# Patient Record
Sex: Female | Born: 1950
Health system: Southern US, Community
[De-identification: ages and names within clinical notes are randomized; demographics above are authoritative.]

## PROBLEM LIST (undated history)

## (undated) DIAGNOSIS — E785 Hyperlipidemia, unspecified: Secondary | ICD-10-CM

## (undated) DIAGNOSIS — I10 Essential (primary) hypertension: Secondary | ICD-10-CM

## (undated) DIAGNOSIS — N920 Excessive and frequent menstruation with regular cycle: Secondary | ICD-10-CM

## (undated) DIAGNOSIS — N182 Chronic kidney disease, stage 2 (mild): Secondary | ICD-10-CM

## (undated) DIAGNOSIS — E669 Obesity, unspecified: Secondary | ICD-10-CM

## (undated) HISTORY — DX: Obesity, unspecified: E66.9

## (undated) HISTORY — DX: Excessive and frequent menstruation with regular cycle: N92.0

## (undated) HISTORY — DX: Chronic kidney disease, stage 2 (mild): N18.2

## (undated) HISTORY — PX: CHOLECYSTECTOMY: SHX55

## (undated) HISTORY — DX: Hyperlipidemia, unspecified: E78.5

## (undated) HISTORY — DX: Essential (primary) hypertension: I10

---

## 1981-08-29 HISTORY — PX: OTHER SURGICAL HISTORY: SHX169

## 2004-01-28 ENCOUNTER — Encounter: Payer: Self-pay | Admitting: Family Medicine

## 2004-01-28 LAB — CONVERTED CEMR LAB

## 2006-04-29 LAB — HM COLONOSCOPY: HM Colonoscopy: NORMAL

## 2006-05-12 LAB — CONVERTED CEMR LAB
ALT: 21 units/L
Calcium: 9.1 mg/dL
Creatinine, Ser: 1.01 mg/dL
Glucose, Bld: 99 mg/dL
HDL: 67 mg/dL
LDL Cholesterol: 98 mg/dL
Total Bilirubin: 0.4 mg/dL
Triglycerides: 96 mg/dL

## 2006-05-17 ENCOUNTER — Encounter: Payer: Self-pay | Admitting: Family Medicine

## 2006-05-17 LAB — CONVERTED CEMR LAB
Microalb Creat Ratio: 6.3 mg/g
Microalb, Ur: 0.37 mg/dL
Microalb, Ur: NORMAL mg/dL

## 2006-09-14 ENCOUNTER — Ambulatory Visit: Payer: Self-pay | Admitting: Family Medicine

## 2006-09-14 DIAGNOSIS — E119 Type 2 diabetes mellitus without complications: Secondary | ICD-10-CM

## 2006-09-14 DIAGNOSIS — E1169 Type 2 diabetes mellitus with other specified complication: Secondary | ICD-10-CM | POA: Insufficient documentation

## 2006-09-14 DIAGNOSIS — E785 Hyperlipidemia, unspecified: Secondary | ICD-10-CM

## 2006-09-14 DIAGNOSIS — E118 Type 2 diabetes mellitus with unspecified complications: Secondary | ICD-10-CM | POA: Insufficient documentation

## 2006-09-14 DIAGNOSIS — I10 Essential (primary) hypertension: Secondary | ICD-10-CM | POA: Insufficient documentation

## 2006-10-12 ENCOUNTER — Ambulatory Visit: Payer: Self-pay | Admitting: Family Medicine

## 2006-10-12 LAB — CONVERTED CEMR LAB: Hgb A1c MFr Bld: 5.9 %

## 2006-10-13 ENCOUNTER — Encounter: Payer: Self-pay | Admitting: Family Medicine

## 2006-11-14 ENCOUNTER — Encounter: Payer: Self-pay | Admitting: Family Medicine

## 2006-12-01 ENCOUNTER — Telehealth: Payer: Self-pay | Admitting: Family Medicine

## 2007-02-01 ENCOUNTER — Ambulatory Visit: Payer: Self-pay | Admitting: Family Medicine

## 2007-02-01 LAB — CONVERTED CEMR LAB: Hgb A1c MFr Bld: 6.2 %

## 2007-07-06 ENCOUNTER — Ambulatory Visit: Payer: Self-pay | Admitting: Family Medicine

## 2007-10-11 ENCOUNTER — Encounter: Payer: Self-pay | Admitting: Family Medicine

## 2007-10-11 LAB — CONVERTED CEMR LAB
ALT: 22 units/L (ref 0–35)
Alkaline Phosphatase: 37 units/L — ABNORMAL LOW (ref 39–117)
BUN: 27 mg/dL — ABNORMAL HIGH (ref 6–23)
Chloride: 104 meq/L (ref 96–112)
Cholesterol: 210 mg/dL — ABNORMAL HIGH (ref 0–200)
Creatinine, Ser: 1.12 mg/dL (ref 0.40–1.20)
Glucose, Bld: 85 mg/dL (ref 70–99)
HDL: 58 mg/dL (ref >39)
Potassium: 5.2 meq/L (ref 3.5–5.3)
Total CHOL/HDL Ratio: 3.6

## 2007-10-12 ENCOUNTER — Encounter: Payer: Self-pay | Admitting: Family Medicine

## 2007-10-16 ENCOUNTER — Ambulatory Visit: Payer: Self-pay | Admitting: Family Medicine

## 2007-11-30 HISTORY — PX: CATARACT EXTRACTION: SUR2

## 2007-12-21 ENCOUNTER — Encounter: Payer: Self-pay | Admitting: Family Medicine

## 2008-01-09 ENCOUNTER — Telehealth: Payer: Self-pay | Admitting: Family Medicine

## 2008-01-11 ENCOUNTER — Encounter: Payer: Self-pay | Admitting: Family Medicine

## 2008-01-12 ENCOUNTER — Encounter: Payer: Self-pay | Admitting: Family Medicine

## 2008-01-12 LAB — CONVERTED CEMR LAB
ALT: 18 U/L
AST: 15 U/L
Direct LDL: 112 mg/dL — ABNORMAL HIGH

## 2008-01-17 ENCOUNTER — Ambulatory Visit: Payer: Self-pay | Admitting: Family Medicine

## 2008-01-17 DIAGNOSIS — G479 Sleep disorder, unspecified: Secondary | ICD-10-CM | POA: Insufficient documentation

## 2008-01-17 LAB — CONVERTED CEMR LAB: Hgb A1c MFr Bld: 6.2 %

## 2008-04-09 ENCOUNTER — Telehealth: Payer: Self-pay | Admitting: Family Medicine

## 2008-04-10 ENCOUNTER — Encounter: Payer: Self-pay | Admitting: Family Medicine

## 2008-04-11 LAB — CONVERTED CEMR LAB: Direct LDL: 100 mg/dL — ABNORMAL HIGH

## 2008-04-19 ENCOUNTER — Ambulatory Visit: Payer: Self-pay | Admitting: Family Medicine

## 2008-04-19 LAB — CONVERTED CEMR LAB

## 2008-08-27 ENCOUNTER — Ambulatory Visit: Payer: Self-pay | Admitting: Family Medicine

## 2008-08-27 DIAGNOSIS — E669 Obesity, unspecified: Secondary | ICD-10-CM

## 2008-08-27 LAB — CONVERTED CEMR LAB: Hgb A1c MFr Bld: 6.2 %

## 2008-10-29 ENCOUNTER — Encounter: Payer: Self-pay | Admitting: Family Medicine

## 2008-10-30 ENCOUNTER — Encounter: Payer: Self-pay | Admitting: Family Medicine

## 2008-10-31 ENCOUNTER — Encounter: Payer: Self-pay | Admitting: Family Medicine

## 2008-10-31 LAB — CONVERTED CEMR LAB
ALT: 24 units/L (ref 0–35)
Albumin: 4.7 g/dL (ref 3.5–5.2)
Calcium: 9.4 mg/dL (ref 8.4–10.5)
Cholesterol: 187 mg/dL (ref 0–200)
Creatinine, Ser: 0.86 mg/dL (ref 0.40–1.20)
Glucose, Bld: 102 mg/dL — ABNORMAL HIGH (ref 70–99)
HDL: 59 mg/dL (ref >39)
Sodium: 139 meq/L (ref 135–145)
T3, Free: 2.5 pg/mL (ref 2.3–4.2)
TSH: 5.949 microintl units/mL — ABNORMAL HIGH (ref 0.350–4.50)
Total CHOL/HDL Ratio: 3.2

## 2008-11-01 ENCOUNTER — Encounter: Payer: Self-pay | Admitting: Family Medicine

## 2008-11-01 DIAGNOSIS — E039 Hypothyroidism, unspecified: Secondary | ICD-10-CM | POA: Insufficient documentation

## 2008-12-17 ENCOUNTER — Telehealth: Payer: Self-pay | Admitting: Family Medicine

## 2008-12-18 ENCOUNTER — Encounter: Payer: Self-pay | Admitting: Family Medicine

## 2008-12-19 LAB — CONVERTED CEMR LAB: TSH: 2.431 microintl units/mL (ref 0.350–4.50)

## 2008-12-26 ENCOUNTER — Ambulatory Visit: Payer: Self-pay | Admitting: Family Medicine

## 2008-12-26 LAB — CONVERTED CEMR LAB: Hgb A1c MFr Bld: 6.5 %

## 2009-01-20 ENCOUNTER — Telehealth: Payer: Self-pay | Admitting: Family Medicine

## 2009-02-07 ENCOUNTER — Encounter: Payer: Self-pay | Admitting: Family Medicine

## 2009-02-11 LAB — CONVERTED CEMR LAB: TSH: 1.358 microintl units/mL (ref 0.350–4.500)

## 2009-02-14 ENCOUNTER — Ambulatory Visit: Payer: Self-pay | Admitting: Family Medicine

## 2009-02-17 ENCOUNTER — Encounter: Payer: Self-pay | Admitting: Family Medicine

## 2009-04-23 ENCOUNTER — Ambulatory Visit: Payer: Self-pay | Admitting: Family Medicine

## 2009-04-23 LAB — CONVERTED CEMR LAB: Albumin/Creatinine Ratio, Urine, POC: <30

## 2009-08-27 ENCOUNTER — Ambulatory Visit: Payer: Self-pay | Admitting: Family Medicine

## 2009-09-02 ENCOUNTER — Encounter: Admission: RE | Admit: 2009-09-02 | Discharge: 2009-09-02 | Payer: Self-pay | Admitting: Endocrinology

## 2009-09-02 LAB — HM MAMMOGRAPHY: HM Mammogram: NEGATIVE

## 2009-12-16 ENCOUNTER — Telehealth: Payer: Self-pay | Admitting: Family Medicine

## 2009-12-16 DIAGNOSIS — R5383 Other fatigue: Secondary | ICD-10-CM

## 2009-12-16 DIAGNOSIS — R5381 Other malaise: Secondary | ICD-10-CM

## 2009-12-17 ENCOUNTER — Encounter: Payer: Self-pay | Admitting: Family Medicine

## 2009-12-18 LAB — CONVERTED CEMR LAB
ALT: 28 units/L (ref 0–35)
AST: 22 units/L (ref 0–37)
Albumin: 4.4 g/dL (ref 3.5–5.2)
Alkaline Phosphatase: 94 units/L (ref 39–117)
BUN: 13 mg/dL (ref 6–23)
CO2: 26 meq/L (ref 19–32)
Calcium: 9.3 mg/dL (ref 8.4–10.5)
Chloride: 100 meq/L (ref 96–112)
Cholesterol: 165 mg/dL (ref 0–200)
Creatinine, Ser: 0.83 mg/dL (ref 0.40–1.20)
Glucose, Bld: 102 mg/dL — ABNORMAL HIGH (ref 70–99)
HCT: 38.6 % (ref 36.0–46.0)
HDL: 44 mg/dL (ref >39)
Hemoglobin: 12.8 g/dL (ref 12.0–15.0)
Hgb A1c MFr Bld: 6.6 % — ABNORMAL HIGH (ref 4.6–6.1)
LDL Cholesterol: 72 mg/dL (ref 0–99)
MCHC: 33.2 g/dL (ref 30.0–36.0)
MCV: 91 fL (ref 78.0–100.0)
Platelets: 300 10*3/uL (ref 150–400)
Potassium: 4.2 meq/L (ref 3.5–5.3)
RBC: 4.24 M/uL (ref 3.87–5.11)
RDW: 13.9 % (ref 11.5–15.5)
Sodium: 138 meq/L (ref 135–145)
Total Bilirubin: 0.3 mg/dL (ref 0.3–1.2)
Total CHOL/HDL Ratio: 3.8
Total Protein: 7 g/dL (ref 6.0–8.3)
Triglycerides: 246 mg/dL — ABNORMAL HIGH (ref <150)
VLDL: 49 mg/dL — ABNORMAL HIGH (ref 0–40)
WBC: 7.1 10*3/uL (ref 4.0–10.5)

## 2009-12-24 ENCOUNTER — Ambulatory Visit: Payer: Self-pay | Admitting: Family Medicine

## 2009-12-24 LAB — CONVERTED CEMR LAB: Glucose, Bld: 117 mg/dL

## 2010-04-10 ENCOUNTER — Ambulatory Visit: Payer: Self-pay | Admitting: Family

## 2010-04-10 DIAGNOSIS — J40 Bronchitis, not specified as acute or chronic: Secondary | ICD-10-CM | POA: Insufficient documentation

## 2010-04-23 ENCOUNTER — Ambulatory Visit: Payer: Self-pay | Admitting: Family Medicine

## 2010-04-24 LAB — CONVERTED CEMR LAB: TSH: 1.949 microintl units/mL (ref 0.350–4.500)

## 2010-10-21 ENCOUNTER — Ambulatory Visit: Payer: Self-pay | Admitting: Family Medicine

## 2010-10-21 LAB — CONVERTED CEMR LAB: Creatinine,U: 100 mg/dL

## 2010-12-29 NOTE — Assessment & Plan Note (Signed)
Summary: Cold, Coughing alot - jr   Vital Signs:  Patient profile:   60 year old female Height:      61.75 inches Weight:      187.50 pounds BMI:     34.70 O2 Sat:      98 % on Room air Temp:     98.3 degrees F oral Pulse rate:   78 / minute Pulse rhythm:   regular Resp:     16 per minute BP sitting:   112 / 70  (right arm) Cuff size:   regular  Vitals Entered By: Mervin Kung CMA (Apr 10, 2010 8:13 AM)  O2 Flow:  Room air CC: room 5  Dry cough since Sunday. Is Patient Diabetic? Yes   Primary Care Provider:  Seymour Bars DO  CC:  room 5  Dry cough since Sunday.Marland Kitchen  History of Present Illness: Ms Emma Craig is a 60 year old female who presents with complaint of cough since Sunday.  Reports that cough has been productive of yellow sputum at times.   Denies associated nasal  congestion/sinus congestion or fever.  She  has not tried any OTC med due to concern about interaction with her othermedications.  Denies sick contacts.  Denies associated alleviating factors or aggravating factors.  Allergies (verified): No Known Drug Allergies  Physical Exam  General:  Well-developed,well-nourished,in no acute distress; alert,appropriate and cooperative throughout examination Head:  Normocephalic and atraumatic without obvious abnormalities. No apparent alopecia or balding. Neck:  No deformities, masses, or tenderness noted. Lungs:  Normal respiratory effort, chest expands symmetrically. Lungs are clear to auscultation, no crackles or wheezes. Heart:  Normal rate and regular rhythm. S1 and S2 normal without gallop, murmur, click, rub or other extra sounds.   Impression & Recommendations:  Problem # 1:  BRONCHITIS, MILD (ICD-490) Assessment New Will treat with Zithromax and tessalon as needed.  Pt instructed to follow up if symptoms worsen or do not improve.  Her updated medication list for this problem includes:    Zithromax Z-pak 250 Mg Tabs (Azithromycin) .Marland Kitchen... 2 tabs by mouth today,  then one tablet by mouth daily x 4 more days    Tessalon Perles 100 Mg Caps (Benzonatate) ..... One cap by mouth three times a day as needed cough  Complete Medication List: 1)  Benicar Hct 40-12.5 Mg Tabs (Olmesartan medoxomil-hctz) .Marland Kitchen.. 1 tab by mouth qd 2)  Amaryl 2 Mg Tabs (Glimepiride) .Marland Kitchen.. 1 tab by mouth qd 3)  Aspirin 81 Mg Tbec (Aspirin) .... Take 1 tablet by mouth once a day 4)  Caduet 10-80 Mg Tabs (Amlodipine-atorvastatin) .Marland Kitchen.. 1 tab by mouth at bedtime 5)  Levoxyl 75 Mcg Tabs (Levothyroxine sodium) .Marland Kitchen.. 1 tab by mouth daily 6)  Atenolol 50 Mg Tabs (Atenolol) .Marland Kitchen.. 1 tab by mouth daily 7)  Fish Oil 1000 Mg Caps (Omega-3 fatty acids) .... Take 1 capsule by mouth once a day 8)  Zithromax Z-pak 250 Mg Tabs (Azithromycin) .... 2 tabs by mouth today, then one tablet by mouth daily x 4 more days 9)  Tessalon Perles 100 Mg Caps (Benzonatate) .... One cap by mouth three times a day as needed cough  Patient Instructions: 1)  Please call if your symptoms worsen or do not improve. Prescriptions: TESSALON PERLES 100 MG CAPS (BENZONATATE) one cap by mouth three times a day as needed cough  #30 x 0   Entered and Authorized by:   Lemont Fillers FNP   Signed by:   Lemont Fillers  FNP on 04/10/2010   Method used:   Electronically to        [facility] [phone]* (retail)       [address]       [place], [place]  [address]       Ph: [phone]       Fax: [phone]   RxID:   [id number] ZITHROMAX Z-PAK 250 MG TABS (AZITHROMYCIN) 2 tabs by mouth today, then one tablet by mouth daily x 4 more days  #1 pack x 0   Entered and Authorized by:   Lemont Fillers FNP   Signed by:   Lemont Fillers FNP on 04/10/2010   Method used:   Electronically to        [facility] [phone]* (retail)       [address]       [place], [place]  [address]       Ph: [phone]       Fax: [phone]   RxID:   [id number]   Current Allergies (reviewed today): No known  allergies

## 2010-12-29 NOTE — Assessment & Plan Note (Signed)
Summary: f/u DM   Vital Signs:  Patient profile:   60 year old female Height:      61.75 inches Weight:      187 pounds BMI:     34.60 O2 Sat:      99 % on Room air Pulse rate:   70 / minute BP sitting:   106 / 70  (left arm) Cuff size:   regular  Vitals Entered By: Payton Spark CMA (Apr 23, 2010 8:31 AM)  O2 Flow:  Room air CC: F/U DM   Primary Care Provider:  Seymour Bars DO  CC:  F/U DM.  History of Present Illness: 60 yo WF presents for f/u T2DM.  She is on Amaryl 2 mg / day.  Denies any lows.  Eye exam is UTD.  A1C is 6.6--> 6.2 today.  Having some triggering of the 3rd and 4th digits on the R hand. She has been on Weight Watchers but has only lost 1 lbs. She is clogging once a wk and doing water aerobics 3 x a wk.  She denies CP or DOE.     Current Medications (verified): 1)  Benicar Hct 40-12.5 Mg Tabs (Olmesartan Medoxomil-Hctz) .Marland Kitchen.. 1 Tab By Mouth Qd 2)  Amaryl 2 Mg Tabs (Glimepiride) .Marland Kitchen.. 1 Tab By Mouth Qd 3)  Aspirin 81 Mg Tbec (Aspirin) .... Take 1 Tablet By Mouth Once A Day 4)  Caduet 10-80 Mg Tabs (Amlodipine-Atorvastatin) .Marland Kitchen.. 1 Tab By Mouth At Bedtime 5)  Levoxyl 75 Mcg Tabs (Levothyroxine Sodium) .Marland Kitchen.. 1 Tab By Mouth Daily 6)  Atenolol 50 Mg Tabs (Atenolol) .Marland Kitchen.. 1 Tab By Mouth Daily 7)  Fish Oil 1000 Mg Caps (Omega-3 Fatty Acids) .... Take 1 Capsule By Mouth Once A Day  Allergies (verified): No Known Drug Allergies  Past History:  Past Medical History: Reviewed history from 02/01/2007 and no changes required. Obesity Diabetes mellitus, type II-- was on Metformin, but caused decreased GFR Hyperlipidemia Hypertension heavy menses G2P2 Decreased GFR  Social History: Reviewed history from 09/14/2006 and no changes required. 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  Married to [place].  Has 2 grown daughters and 2 grandsons (local).  Nonsmoker.  Rare ETOH.  Likes to Clog 2 x a wk.  Review of Systems      See  HPI  Physical Exam  General:  alert, well-developed, well-nourished, and well-hydrated.   Head:  normocephalic and atraumatic.   Eyes:  pupils equal, pupils round, and pupils reactive to light.   Ears:  no external deformities.   Nose:  no nasal discharge.   Mouth:  pharynx pink and moist and fair dentition.   Neck:  no masses.   Lungs:  Normal respiratory effort, chest expands symmetrically. Lungs are clear to auscultation, no crackles or wheezes. Heart:  Normal rate and regular rhythm. S1 and S2 normal without gallop, murmur, click, rub or other extra sounds. Abdomen:  soft with truncal obesity.  NO AA bruits.  No HSM Extremities:  no LE edema Skin:  color normal.   Cervical Nodes:  No lymphadenopathy noted Psych:  good eye contact, not anxious appearing, and not depressed appearing.     Impression & Recommendations:  Problem # 1:  DIABETES MELLITUS, TYPE II (ICD-250.00) A1C great at 6.2, down from 6.6.  Will get urine micro at next visit.  She claims that her eye exam is UTD.  Her labs are UTD.  She is to stay on  Amaryl and work on [employer], exercise, wt loss.  RTC in 6 mos. Her updated medication list for this problem includes:    Benicar Hct 40-12.5 Mg Tabs (Olmesartan medoxomil-hctz) .Marland Kitchen... 1 tab by mouth qd    Amaryl 2 Mg Tabs (Glimepiride) .Marland Kitchen... 1 tab by mouth qd    Aspirin 81 Mg Tbec (Aspirin) .Marland Kitchen... Take 1 tablet by mouth once a day  Orders: Fingerstick (56213) Hemoglobin A1C (83036)  Labs Reviewed: Creat: 0.83 (12/17/2009)   Microalbumin: 10 (04/23/2009)  Last Eye Exam: normal Dr Weston Anna (10/07/2006) Reviewed HgBA1c results: 6.2 (04/23/2010)  6.6 (12/17/2009)  Problem # 2:  HYPERTENSION (ICD-401.9) At goal on current meds.  Continue.  Labs are UTD. Her updated medication list for this problem includes:    Benicar Hct 40-12.5 Mg Tabs (Olmesartan medoxomil-hctz) .Marland Kitchen... 1 tab by mouth qd    Caduet 10-80 Mg Tabs (Amlodipine-atorvastatin) .Marland Kitchen... 1 tab by mouth at  bedtime    Atenolol 50 Mg Tabs (Atenolol) .Marland Kitchen... 1 tab by mouth daily  BP today: 106/70 Prior BP: 112/70 (04/10/2010)  Labs Reviewed: K+: 4.2 (12/17/2009) Creat: : 0.83 (12/17/2009)   Chol: 165 (12/17/2009)   HDL: 44 (12/17/2009)   LDL: 72 (12/17/2009)   TG: 246 (12/17/2009)  Problem # 3:  HYPERLIPIDEMIA (ICD-272.4)  Her updated medication list for this problem includes:    Caduet 10-80 Mg Tabs (Amlodipine-atorvastatin) .Marland Kitchen... 1 tab by mouth at bedtime  Labs Reviewed: SGOT: 22 (12/17/2009)   SGPT: 28 (12/17/2009)   HDL:44 (12/17/2009), 59 (10/29/2008)  LDL:72 (12/17/2009), 82 (10/29/2008)  Chol:165 (12/17/2009), 187 (10/29/2008)  Trig:246 (12/17/2009), 228 (10/29/2008)  Complete Medication List: 1)  Benicar Hct 40-12.5 Mg Tabs (Olmesartan medoxomil-hctz) .Marland Kitchen.. 1 tab by mouth qd 2)  Amaryl 2 Mg Tabs (Glimepiride) .Marland Kitchen.. 1 tab by mouth qd 3)  Aspirin 81 Mg Tbec (Aspirin) .... Take 1 tablet by mouth once a day 4)  Caduet 10-80 Mg Tabs (Amlodipine-atorvastatin) .Marland Kitchen.. 1 tab by mouth at bedtime 5)  Levoxyl 75 Mcg Tabs (Levothyroxine sodium) .Marland Kitchen.. 1 tab by mouth daily 6)  Atenolol 50 Mg Tabs (Atenolol) .Marland Kitchen.. 1 tab by mouth daily 7)  Fish Oil 1000 Mg Caps (Omega-3 fatty acids) .... Take 1 capsule by mouth once a day  Other Orders: T-TSH ([id number])  Patient Instructions: 1)  Check TSH today. 2)  Will adjust thyroid meds tomorrow and call you w/ the results. 3)  Return for f/u in 6 mos. Prescriptions: ATENOLOL 50 MG TABS (ATENOLOL) 1 tab by mouth daily  #30 x 6   Entered and Authorized by:   Seymour Bars DO   Signed by:   Seymour Bars DO on 04/23/2010   Method used:   Electronically to        [facility]  [id number]* (retail)       [address]       [place], [place]  [address]       Ph: [phone]       Fax: [phone]   RxID:   [id number] CADUET 10-80 MG TABS (AMLODIPINE-ATORVASTATIN) 1 tab by mouth at bedtime  #30 Tablet x 6   Entered and Authorized by:   Seymour Bars DO    Signed by:   Seymour Bars DO on 04/23/2010   Method used:   Electronically to        [facility] [phone]* (retail)       [address]       [place], [place]  [address]  Ph: [phone]       Fax: [phone]   RxID:   [id number] AMARYL 2 MG TABS (GLIMEPIRIDE) 1 tab by mouth qd  #30 Tablet x 6   Entered and Authorized by:   Seymour Bars DO   Signed by:   Seymour Bars DO on 04/23/2010   Method used:   Electronically to        [facility]  [id number]* (retail)       [address]       [place], [place]  [address]       Ph: [phone]       Fax: [phone]   RxID:   [id number] BENICAR HCT 40-12.5 MG TABS (OLMESARTAN MEDOXOMIL-HCTZ) 1 tab by mouth qd  #30 Tablet x 6   Entered and Authorized by:   Seymour Bars DO   Signed by:   Seymour Bars DO on 04/23/2010   Method used:   Electronically to        [facility]  [address] [phone]* (retail)       [address]       [place], [place]  [address]       Ph: [phone]       Fax: [phone]   RxID:   [id number]   Laboratory Results   Blood Tests     HGBA1C: 6.2%   (Normal Range: Non-Diabetic - 3-6%   Control Diabetic - 6-8%)

## 2010-12-29 NOTE — Assessment & Plan Note (Signed)
Summary: f/u diabetes   Vital Signs:  Patient profile:   60 year old female Height:      61.75 inches Weight:      192 pounds Pulse rate:   76 / minute BP sitting:   123 / 70  (left arm) Cuff size:   regular  Vitals Entered By: Kathlene November (December 24, 2009 8:11 AM) CC: checkup BP and blood sugars Is Patient Diabetic? Yes Did you bring your meter with you today? No   Primary Care Provider:  Seymour Bars DO  CC:  checkup BP and blood sugars.  History of Present Illness: 60 yo WF presents for f/u T2DM, HTN and hyperlipidemia.    She started Weight Watchers 2 wks ago.  She has lost 5 lbs so far.  She is clogging and just busy at work with more walking at work.  Her BP has improved.  Her labs were updated last wk.  She is not checking her sugars at home.  She has felt shakey from time to time.  She is on Amaryl with breakfast.  Denies CP or DOE.  Denies blurry vision or paresthesias.    Current Medications (verified): 1)  Benicar Hct 40-12.5 Mg Tabs (Olmesartan Medoxomil-Hctz) .Marland Kitchen.. 1 Tab By Mouth Qd 2)  Amaryl 2 Mg Tabs (Glimepiride) .Marland Kitchen.. 1 Tab By Mouth Qd 3)  Aspirin 81 Mg Tbec (Aspirin) .... Take 1 Tablet By Mouth Once A Day 4)  Caduet 10-80 Mg Tabs (Amlodipine-Atorvastatin) .Marland Kitchen.. 1 Tab By Mouth At Bedtime 5)  Levoxyl 75 Mcg Tabs (Levothyroxine Sodium) .Marland Kitchen.. 1 Tab By Mouth Daily 6)  Atenolol 50 Mg Tabs (Atenolol) .Marland Kitchen.. 1 Tab By Mouth Daily  Allergies (verified): No Known Drug Allergies  Comments:  Nurse/Medical Assistant: The patient's medications and allergies were reviewed with the patient and were updated in the Medication and Allergy Lists. Kathlene November (December 24, 2009 8:12 AM)  Past History:  Past Medical History: Reviewed history from 02/01/2007 and no changes required. Obesity Diabetes mellitus, type II-- was on Metformin, but caused decreased GFR Hyperlipidemia Hypertension heavy menses G2P2 Decreased GFR  Past Surgical History: Reviewed history from  01/17/2008 and no changes required. R elbow screws placed 10-82 lap cholecystectomy 1-04 R cataract surgery 1-09  Social History: Reviewed history from 09/14/2006 and no changes required. 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  Married to [place].  Has 2 grown daughters and 2 grandsons (local).  Nonsmoker.  Rare ETOH.  Likes to Clog 2 x a wk.  Review of Systems      See HPI  Physical Exam  General:  alert, well-developed, well-nourished, and well-hydrated.  obese Head:  normocephalic and atraumatic.   Eyes:  pupils equal, pupils round, and pupils reactive to light.   Ears:  no external deformities.   Nose:  no nasal discharge.   Mouth:  pharynx pink and moist.   Neck:  no masses.   Lungs:  Normal respiratory effort, chest expands symmetrically. Lungs are clear to auscultation, no crackles or wheezes. Heart:  Normal rate and regular rhythm. S1 and S2 normal without gallop, murmur, click, rub or other extra sounds. Abdomen:  soft with truncal obesity.  NO AA bruits.  No HSM Extremities:  no LE edema Skin:  color normal.   Cervical Nodes:  No lymphadenopathy noted Psych:  good eye contact, not anxious appearing, and not depressed appearing.     Impression & Recommendations:  Problem # 1:  DIABETES  MELLITUS, TYPE II (ICD-250.00) A1C at goal 6.6.  Encouraged wt loss thru healthy diet and regular exercise.  Continue current meds.  Encouraged home monitoring. RTC in 4 mos.  Her updated medication list for this problem includes:    Benicar Hct 40-12.5 Mg Tabs (Olmesartan medoxomil-hctz) .Marland Kitchen... 1 tab by mouth qd    Amaryl 2 Mg Tabs (Glimepiride) .Marland Kitchen... 1 tab by mouth qd    Aspirin 81 Mg Tbec (Aspirin) .Marland Kitchen... Take 1 tablet by mouth once a day  Orders: Fingerstick (95621) Capillary Blood Glucose/CBG (30865)  Labs Reviewed: Creat: 0.83 (12/17/2009)   Microalbumin: 10 (04/23/2009)  Last Eye Exam: normal Dr Weston Anna (10/07/2006) Reviewed HgBA1c results: 6.6  (12/17/2009)  6.3 (08/27/2009)  Problem # 2:  HYPERTENSION (ICD-401.9) Assessment: Improved Labs UTD.  Meds RFd.   Her updated medication list for this problem includes:    Benicar Hct 40-12.5 Mg Tabs (Olmesartan medoxomil-hctz) .Marland Kitchen... 1 tab by mouth qd    Caduet 10-80 Mg Tabs (Amlodipine-atorvastatin) .Marland Kitchen... 1 tab by mouth at bedtime    Atenolol 50 Mg Tabs (Atenolol) .Marland Kitchen... 1 tab by mouth daily  BP today: 123/70 Prior BP: 144/71 (08/27/2009)  Labs Reviewed: K+: 4.2 (12/17/2009) Creat: : 0.83 (12/17/2009)   Chol: 165 (12/17/2009)   HDL: 44 (12/17/2009)   LDL: 72 (12/17/2009)   TG: 246 (12/17/2009)  Problem # 3:  HYPERLIPIDEMIA (ICD-272.4)  Her updated medication list for this problem includes:    Caduet 10-80 Mg Tabs (Amlodipine-atorvastatin) .Marland Kitchen... 1 tab by mouth at bedtime  Labs Reviewed: SGOT: 22 (12/17/2009)   SGPT: 28 (12/17/2009)   HDL:44 (12/17/2009), 59 (10/29/2008)  LDL:72 (12/17/2009), 82 (10/29/2008)  Chol:165 (12/17/2009), 187 (10/29/2008)  Trig:246 (12/17/2009), 228 (10/29/2008)  Problem # 4:  UNSPECIFIED HYPOTHYROIDISM (ICD-244.9)  Her updated medication list for this problem includes:    Levoxyl 75 Mcg Tabs (Levothyroxine sodium) .Marland Kitchen... 1 tab by mouth daily  Labs Reviewed: TSH: 1.630 (08/27/2009)    HgBA1c: 6.6 (12/17/2009) Chol: 165 (12/17/2009)   HDL: 44 (12/17/2009)   LDL: 72 (12/17/2009)   TG: 246 (12/17/2009)  Complete Medication List: 1)  Benicar Hct 40-12.5 Mg Tabs (Olmesartan medoxomil-hctz) .Marland Kitchen.. 1 tab by mouth qd 2)  Amaryl 2 Mg Tabs (Glimepiride) .Marland Kitchen.. 1 tab by mouth qd 3)  Aspirin 81 Mg Tbec (Aspirin) .... Take 1 tablet by mouth once a day 4)  Caduet 10-80 Mg Tabs (Amlodipine-atorvastatin) .Marland Kitchen.. 1 tab by mouth at bedtime 5)  Levoxyl 75 Mcg Tabs (Levothyroxine sodium) .Marland Kitchen.. 1 tab by mouth daily 6)  Atenolol 50 Mg Tabs (Atenolol) .Marland Kitchen.. 1 tab by mouth daily  Patient Instructions: 1)  Meds RFd 2)  A1C looks great at 6.6. 3)  Return for f/u in 4  mos. Prescriptions: ATENOLOL 50 MG TABS (ATENOLOL) 1 tab by mouth daily  #30 x 5   Entered and Authorized by:   Seymour Bars DO   Signed by:   Seymour Bars DO on 12/24/2009   Method used:   Electronically to        [facility]  [id number]* (retail)       [address]       [place], [place]  [address]       Ph: [phone]       Fax: [phone]   RxID:   [id number] LEVOXYL 75 MCG TABS (LEVOTHYROXINE SODIUM) 1 tab by mouth daily Brand medically necessary #30 Tablet x 5   Entered and Authorized by:   Seymour Bars DO   Signed by:  Seymour Bars DO on 12/24/2009   Method used:   Electronically to        [facility] [phone]* (retail)       [address]       [place], [place]  [address]       Ph: [phone]       Fax: [phone]   RxID:   [id number] CADUET 10-80 MG TABS (AMLODIPINE-ATORVASTATIN) 1 tab by mouth at bedtime  #30 Tablet x 5   Entered and Authorized by:   Seymour Bars DO   Signed by:   Seymour Bars DO on 12/24/2009   Method used:   Electronically to        [facility] [phone]* (retail)       [address]       [place], [place]  [address]       Ph: [phone]       Fax: [phone]   RxID:   [id number] AMARYL 2 MG TABS (GLIMEPIRIDE) 1 tab by mouth qd  #30 Tablet x 5   Entered and Authorized by:   Seymour Bars DO   Signed by:   Seymour Bars DO on 12/24/2009   Method used:   Electronically to        [facility]  [address] [phone]* (retail)       [address]       [place], [place]  [address]       Ph: [phone]       Fax: [phone]   RxID:   [id number] BENICAR HCT 40-12.5 MG TABS (OLMESARTAN MEDOXOMIL-HCTZ) 1 tab by mouth qd  #30 Tablet x 5   Entered and Authorized by:   Seymour Bars DO   Signed by:   Seymour Bars DO on 12/24/2009   Method used:   Electronically to        [facility]  [id number]* (retail)       [address]       [place], [place]  [address]       Ph: [phone]       Fax: [phone]   RxID:   [id number]   Laboratory  Results   Blood Tests   Date/Time Received: 12/24/2009 Date/Time Reported: 12/24/2009  Glucose (random): 117 mg/dL   (Normal Range: 15-400)

## 2010-12-29 NOTE — Assessment & Plan Note (Signed)
Summary: f/u DM   Vital Signs:  Patient profile:   60 year old female Height:      61.75 inches Weight:      199 pounds BMI:     36.83 O2 Sat:      98 % on Room air Pulse rate:   73 / minute BP sitting:   141 / 71  (left arm) Cuff size:   regular  Vitals Entered By: Emma Craig CMA (October 21, 2010 4:03 PM)  O2 Flow:  Room air  CC: F/U DM.   Primary Care Provider:  Seymour Bars DO  CC:  F/U DM.Marland Kitchen  History of Present Illness: 60 yo WF prsents for f/u DM.  She had a normal eye exam with Emma Craig 6 mos ago.  Her A1C looks great on Amaryl.  She is still struggling w/ her weight.  She is exercising about 2 x a wk.  Denies any CP or DOE.  Declined a flu shot.  She is not checking her sugars at home.  Her BPs are running higher even with Benicar - HCTZ, ATenolol and Caduet.      Allergies: No Known Drug Allergies  Past History:  Past Medical History: Reviewed history from 02/01/2007 and no changes required. Obesity Diabetes mellitus, type II-- was on Metformin, but caused decreased GFR Hyperlipidemia Hypertension heavy menses G2P2 Decreased GFR  Social History: Reviewed history from 09/14/2006 and no changes required. 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  Married to Emma Craig.  Has 2 grown daughters and 2 grandsons (local).  Nonsmoker.  Rare ETOH.  Likes to Clog 2 x a wk.  Review of Systems      See HPI  Physical Exam  General:  alert, well-developed, well-nourished, and well-hydrated.  truncal obesity Head:  normocephalic and atraumatic.   Eyes:  pupils equal, pupils round, and pupils reactive to light.   Mouth:  pharynx pink and moist and fair dentition.   Neck:  no masses.   Lungs:  Normal respiratory effort, chest expands symmetrically. Lungs are clear to auscultation, no crackles or wheezes. Heart:  Normal rate and regular rhythm. S1 and S2 normal without gallop, murmur, click, rub or other extra sounds. Pulses:  2+ radial  pulses Extremities:  no LE edema Skin:  color normal.   Psych:  good eye contact, not anxious appearing, and not depressed appearing.    Diabetes Management Exam:    Foot Exam (with socks and/or shoes not present):       Sensory-Pinprick/Light touch:          Left medial foot (L-4): normal          Left dorsal foot (L-5): normal          Left lateral foot (S-1): normal          Right medial foot (L-4): normal          Right dorsal foot (L-5): normal          Right lateral foot (S-1): normal       Sensory-Monofilament:          Left foot: normal          Right foot: normal       Inspection:          Left foot: normal          Right foot: normal       Nails:  Left foot: normal          Right foot: normal   Impression & Recommendations:  Problem # 1:  DIABETES MELLITUS, TYPE II (ICD-250.00) Assessment Unchanged A1C unchanged at 6.2.  Pt is not checking her sugars at home.  Declined flu shot.  Labs, eye exam and urine micro are now UTD.   WT gain 12 lbs from May with a BMI of 36 c/w class II obesity.  We discussed a low sugar/ low carb diet with increasing excercise for wt loss which would help her insulin resistance, dyslipidemia and her HTN.   Her updated medication list for this problem includes:    Benicar Hct 40-25 Mg Tabs (Olmesartan medoxomil-hctz) .Marland Kitchen... 1 tab by mouth daily    Amaryl 2 Mg Tabs (Glimepiride) .Marland Kitchen... 1 tab by mouth qd    Aspirin 81 Mg Tbec (Aspirin) .Marland Kitchen... Take 1 tablet by mouth once a day  Orders: Fingerstick (29562) Hemoglobin A1C (13086) Urine Microalbumin (57846)  Labs Reviewed: Creat: 0.83 (12/17/2009)   Microalbumin: 10 (10/21/2010)  Last Eye Exam: normal Emma Craig (10/07/2006) Reviewed HgBA1c results: 6.2 (10/21/2010)  6.2 (04/23/2010)  Problem # 2:  HYPERTENSION (ICD-401.9) BP up today with a goal of <130/80, likely due to further wt gain.  I will increase the HCTZ portion of her Benicar/ HCTZ and see her back in 3 mos.  I do think  that with wt loss, healthy diet and exercise, this will come down.  Plan to do stress test in 2012.   Her updated medication list for this problem includes:    Benicar Hct 40-25 Mg Tabs (Olmesartan medoxomil-hctz) .Marland Kitchen... 1 tab by mouth daily    Caduet 10-80 Mg Tabs (Amlodipine-atorvastatin) .Marland Kitchen... 1 tab by mouth at bedtime    Atenolol 50 Mg Tabs (Atenolol) .Marland Kitchen... 1 tab by mouth daily  BP today: 141/71 Prior BP: 106/70 (04/23/2010)  Labs Reviewed: K+: 4.2 (12/17/2009) Creat: : 0.83 (12/17/2009)   Chol: 165 (12/17/2009)   HDL: 44 (12/17/2009)   LDL: 72 (12/17/2009)   TG: 246 (12/17/2009)  Problem # 3:  HYPERLIPIDEMIA (ICD-272.4) Labs updated Jan 2011.  Continue current meds.  LDL goal is < 100. Her updated medication list for this problem includes:    Caduet 10-80 Mg Tabs (Amlodipine-atorvastatin) .Marland Kitchen... 1 tab by mouth at bedtime  Labs Reviewed: SGOT: 22 (12/17/2009)   SGPT: 28 (12/17/2009)   HDL:44 (12/17/2009), 59 (10/29/2008)  LDL:72 (12/17/2009), 82 (10/29/2008)  Chol:165 (12/17/2009), 187 (10/29/2008)  Trig:246 (12/17/2009), 228 (10/29/2008)  Problem # 4:  UNSPECIFIED HYPOTHYROIDISM (ICD-244.9) TSH at goal in May; checking q 6 mos.   Her updated medication list for this problem includes:    Levoxyl 75 Mcg Tabs (Levothyroxine sodium) .Marland Kitchen... 1 tab by mouth daily  Labs Reviewed: TSH: 1.949 (04/23/2010)    HgBA1c: 6.2 (10/21/2010) Chol: 165 (12/17/2009)   HDL: 44 (12/17/2009)   LDL: 72 (12/17/2009)   TG: 246 (12/17/2009)  Complete Medication List: 1)  Benicar Hct 40-25 Mg Tabs (Olmesartan medoxomil-hctz) .Marland Kitchen.. 1 tab by mouth daily 2)  Amaryl 2 Mg Tabs (Glimepiride) .Marland Kitchen.. 1 tab by mouth qd 3)  Aspirin 81 Mg Tbec (Aspirin) .... Take 1 tablet by mouth once a day 4)  Caduet 10-80 Mg Tabs (Amlodipine-atorvastatin) .Marland Kitchen.. 1 tab by mouth at bedtime 5)  Levoxyl 75 Mcg Tabs (Levothyroxine sodium) .Marland Kitchen.. 1 tab by mouth daily 6)  Atenolol 50 Mg Tabs (Atenolol) .Marland Kitchen.. 1 tab by mouth daily 7)  Fish Oil  1000 Mg Caps (Omega-3 fatty acids) .Marland KitchenMarland KitchenMarland Kitchen  Take 1 capsule by mouth once a day  Patient Instructions: 1)  A1C looks good at 6.2. 2)  Stay on current meds. 3)  Work on LOW CARB/ LOW SUGAR Diet.  Aim to keep total CARBS to < 40/ day.  Increase exercise to 5 days/ wk. 4)  Increase Benicar/HCTZ dose. 5)  Return for f/u with fasting labs in 3 mos. Prescriptions: BENICAR HCT 40-25 MG TABS (OLMESARTAN MEDOXOMIL-HCTZ) 1 tab by mouth daily  #30 x 5   Entered and Authorized by:   Emma Bars DO   Signed by:   Emma Bars DO on 10/21/2010   Method used:   Electronically to        [facility]  [id number] (retail)       [address]       [place], [place]  [address]       Ph: [phone]       Fax: [phone]   RxID:   [id number]    Orders Added: 1)  Fingerstick [36416] 2)  Hemoglobin A1C [83036] 3)  Urine Microalbumin [82044] 4)  Est. Patient Level IV [84132]    Laboratory Results   Urine Tests    Microalbumin (urine): 10 mg/L Creatinine: 100mg /dL  A:C Ratio <44  Blood Tests     HGBA1C: 6.2%   (Normal Range: Non-Diabetic - 3-6%   Control Diabetic - 6-8%)

## 2010-12-29 NOTE — Progress Notes (Signed)
Summary: Labs  Phone Note Call from Patient   Caller: Patient Summary of Call: Pt LMOM stating she is coming in for OV next week and would like to have labs done this week so you will have the lab results at her OV. Please order and I will fax to lab.  Initial call taken by: Payton Spark CMA,  December 16, 2009 12:35 PM  Follow-up for Phone Call        printed.  have them drawn fasting. TSH due in March. Follow-up by: Seymour Bars DO,  December 16, 2009 12:38 PM  New Problems: FATIGUE (ICD-780.79)   New Problems: FATIGUE (ICD-780.79)  Appended Document: Labs Pt aware

## 2011-01-19 ENCOUNTER — Encounter: Payer: Self-pay | Admitting: Family Medicine

## 2011-01-19 ENCOUNTER — Telehealth: Payer: Self-pay | Admitting: Family Medicine

## 2011-01-19 LAB — CONVERTED CEMR LAB
Calcium: 9.3 mg/dL (ref 8.4–10.5)
Chloride: 100 meq/L (ref 96–112)
Creatinine, Ser: 0.98 mg/dL (ref 0.40–1.20)
Potassium: 4.8 meq/L (ref 3.5–5.3)
TSH: 1.092 microintl units/mL (ref 0.350–4.500)
Total Bilirubin: 0.3 mg/dL (ref 0.3–1.2)
Total CHOL/HDL Ratio: 4
Triglycerides: 203 mg/dL — ABNORMAL HIGH (ref <150)

## 2011-01-22 ENCOUNTER — Encounter: Payer: Self-pay | Admitting: Family Medicine

## 2011-01-22 ENCOUNTER — Ambulatory Visit: Payer: Self-pay | Admitting: Family Medicine

## 2011-01-22 ENCOUNTER — Ambulatory Visit (INDEPENDENT_AMBULATORY_CARE_PROVIDER_SITE_OTHER): Admitting: Family Medicine

## 2011-01-22 DIAGNOSIS — E785 Hyperlipidemia, unspecified: Secondary | ICD-10-CM

## 2011-01-22 DIAGNOSIS — I10 Essential (primary) hypertension: Secondary | ICD-10-CM

## 2011-01-22 DIAGNOSIS — E119 Type 2 diabetes mellitus without complications: Secondary | ICD-10-CM

## 2011-01-22 DIAGNOSIS — E039 Hypothyroidism, unspecified: Secondary | ICD-10-CM

## 2011-01-22 LAB — HM DIABETES EYE EXAM: HM Diabetic Eye Exam: NORMAL

## 2011-01-26 NOTE — Progress Notes (Signed)
Summary: Fasting lab order  Phone Note Call from Patient   Caller: Patient Summary of Call: Pt is down in the lab for fasting labs. Please order and I will fax.  Initial call taken by: Payton Spark CMA,  January 19, 2011 8:22 AM     Appended Document: Fasting lab order faxed

## 2011-01-26 NOTE — Assessment & Plan Note (Signed)
Summary: f/u BP   Vital Signs:  Patient profile:   60 year old female Height:      61.75 inches (156.85 cm) Weight:      190.50 pounds (86.59 kg) BMI:     35.25 O2 Sat:      97 % on Room air Temp:     97.9 degrees F (36.61 degrees C) oral Pulse rate:   68 / minute BP sitting:   131 / 72  (right arm) Cuff size:   regular  Vitals Entered By: Lucious Groves CMA (January 22, 2011 3:05 PM)  O2 Flow:  Room air CC: 3 month follow up--Pt states that her diabetes is the same./kb Is Patient Diabetic? Yes Pain Assessment Patient in pain? no        Primary Care Provider:  Seymour Bars DO  CC:  3 month follow up--Pt states that her diabetes is the same./kb.  History of Present Illness: 60 yo WF presents for f/u HTN.  We increased HCTZ portion of her BP medicine.  Denies CP, DOE , palpitations or leg swelling. Had a normal TSH on labs this wk.  - on thyroid meds.  She is not checking her sugar at home.  She has lost 9 lbs since her visit November.  She has lost it by eating a low carb diet.  She is still clogging.  She plans to update her stress test this year but wants to wait due to cost.   Not checking home sugars.  On Amaryl.      Current Medications (verified): 1)  Benicar Hct 40-25 Mg Tabs (Olmesartan Medoxomil-Hctz) .Marland Kitchen.. 1 Tab By Mouth Daily 2)  Amaryl 2 Mg Tabs (Glimepiride) .Marland Kitchen.. 1 Tab By Mouth Qd 3)  Aspirin 81 Mg Tbec (Aspirin) .... Take 1 Tablet By Mouth Once A Day 4)  Caduet 10-80 Mg Tabs (Amlodipine-Atorvastatin) .Marland Kitchen.. 1 Tab By Mouth At Bedtime 5)  Levoxyl 75 Mcg Tabs (Levothyroxine Sodium) .Marland Kitchen.. 1 Tab By Mouth Daily 6)  Atenolol 50 Mg Tabs (Atenolol) .Marland Kitchen.. 1 Tab By Mouth Daily 7)  Fish Oil 1000 Mg Caps (Omega-3 Fatty Acids) .... Take 1 Capsule By Mouth Once A Day  Allergies (verified): No Known Drug Allergies  Past History:  Past Medical History: Reviewed history from 02/01/2007 and no changes required. Obesity Diabetes mellitus, type II-- was on Metformin, but  caused decreased GFR Hyperlipidemia Hypertension heavy menses G2P2 Decreased GFR  Social History: Reviewed history from 09/14/2006 and no changes required. 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  Married to [place].  Has 2 grown daughters and 2 grandsons (local).  Nonsmoker.  Rare ETOH.  Likes to Clog 2 x a wk.  Review of Systems      See HPI  Physical Exam  General:  alert, well-developed, well-nourished, and well-hydrated.  truncal obesity Head:  normocephalic and atraumatic.   Eyes:  pupils equal, pupils round, and pupils reactive to light.   Mouth:  fair dentition.   Neck:  no masses.   Lungs:  Normal respiratory effort, chest expands symmetrically. Lungs are clear to auscultation, no crackles or wheezes. Heart:  Normal rate and regular rhythm. S1 and S2 normal without gallop, murmur, click, rub or other extra sounds. Extremities:  no LE edema Skin:  color normal.   Psych:  good eye contact, not anxious appearing, and not depressed appearing.     Impression & Recommendations:  Problem # 1:  HYPERTENSION (ICD-401.9) SBP improved, only  1 point high.  Continue current meds and keep working on wt loss.  Plan to update ETT this year. Her updated medication list for this problem includes:    Benicar Hct 40-25 Mg Tabs (Olmesartan medoxomil-hctz) .Marland Kitchen... 1 tab by mouth daily    Caduet 10-80 Mg Tabs (Amlodipine-atorvastatin) .Marland Kitchen... 1 tab by mouth at bedtime    Atenolol 50 Mg Tabs (Atenolol) .Marland Kitchen... 1 tab by mouth daily  BP today: 131/72 Prior BP: 141/71 (10/21/2010)  Labs Reviewed: K+: 4.8 (01/19/2011) Creat: : 0.98 (01/19/2011)   Chol: 176 (01/19/2011)   HDL: 44 (01/19/2011)   LDL: 91 (01/19/2011)   TG: 203 (01/19/2011)  Problem # 2:  UNSPECIFIED HYPOTHYROIDISM (ICD-244.9) TSH normal this wk.  Continue current dose thyroid medicine and recheck in 6 mos. Her updated medication list for this problem includes:    Levoxyl 75 Mcg Tabs (Levothyroxine sodium)  .Marland Kitchen... 1 tab by mouth daily  Labs Reviewed: TSH: 1.092 (01/19/2011)    HgBA1c: 6.2 (10/21/2010) Chol: 176 (01/19/2011)   HDL: 44 (01/19/2011)   LDL: 91 (01/19/2011)   TG: 203 (01/19/2011)  Problem # 3:  DIABETES MELLITUS, TYPE II (ICD-250.00) Last A1C good at 6.2; repeat q 6 mos. 9 lbs lost with low carb diet, continue. Her updated medication list for this problem includes:    Benicar Hct 40-25 Mg Tabs (Olmesartan medoxomil-hctz) .Marland Kitchen... 1 tab by mouth daily    Amaryl 2 Mg Tabs (Glimepiride) .Marland Kitchen... 1 tab by mouth qd    Aspirin 81 Mg Tbec (Aspirin) .Marland Kitchen... Take 1 tablet by mouth once a day  Labs Reviewed: Creat: 0.98 (01/19/2011)   Microalbumin: 10 (10/21/2010)  Last Eye Exam: May 2011 normal [facility] (01/22/2011) Reviewed HgBA1c results: 6.2 (10/21/2010)  6.2 (04/23/2010)  Problem # 4:  HYPERLIPIDEMIA (ICD-272.4) LFTs normal.  LDL at goal.  Continue Caduet. Her updated medication list for this problem includes:    Caduet 10-80 Mg Tabs (Amlodipine-atorvastatin) .Marland Kitchen... 1 tab by mouth at bedtime  Labs Reviewed: SGOT: 27 (01/19/2011)   SGPT: 36 (01/19/2011)   HDL:44 (01/19/2011), 44 (12/17/2009)  LDL:91 (01/19/2011), 72 (12/17/2009)  Chol:176 (01/19/2011), 165 (12/17/2009)  Trig:203 (01/19/2011), 246 (12/17/2009)  Complete Medication List: 1)  Benicar Hct 40-25 Mg Tabs (Olmesartan medoxomil-hctz) .Marland Kitchen.. 1 tab by mouth daily 2)  Amaryl 2 Mg Tabs (Glimepiride) .Marland Kitchen.. 1 tab by mouth qd 3)  Aspirin 81 Mg Tbec (Aspirin) .... Take 1 tablet by mouth once a day 4)  Caduet 10-80 Mg Tabs (Amlodipine-atorvastatin) .Marland Kitchen.. 1 tab by mouth at bedtime 5)  Levoxyl 75 Mcg Tabs (Levothyroxine sodium) .Marland Kitchen.. 1 tab by mouth daily 6)  Atenolol 50 Mg Tabs (Atenolol) .Marland Kitchen.. 1 tab by mouth daily 7)  Fish Oil 1000 Mg Caps (Omega-3 fatty acids) .... Take 1 capsule by mouth once a day  Patient Instructions: 1)  Stay on current meds. 2)  Keep up the good work. 3)  Return for f/u sugar/ BP in 4  mos. Prescriptions: CADUET 10-80 MG TABS (AMLODIPINE-ATORVASTATIN) 1 tab by mouth at bedtime  #30 Tablet x 6   Entered and Authorized by:   Seymour Bars DO   Signed by:   Seymour Bars DO on 01/22/2011   Method used:   Electronically to        [facility]  [id number] (retail)       [address]       [place], [place]  [address]       Ph: [phone]  Fax: [phone]   RxID:   [id number] AMARYL 2 MG TABS (GLIMEPIRIDE) 1 tab by mouth qd  #30 Tablet x 6   Entered and Authorized by:   Seymour Bars DO   Signed by:   Seymour Bars DO on 01/22/2011   Method used:   Electronically to        [facility]  [id number] (retail)       [address]       [place], [place]  [address]       Ph: [phone]       Fax: [phone]   RxID:   [id number]    Orders Added: 1)  Est. Patient Level IV [03500]

## 2011-04-23 ENCOUNTER — Other Ambulatory Visit: Payer: Self-pay | Admitting: Family Medicine

## 2011-05-11 ENCOUNTER — Other Ambulatory Visit: Payer: Self-pay | Admitting: Family Medicine

## 2011-05-11 NOTE — Telephone Encounter (Signed)
Pt called this am and stated that she had taken the wrong BP med which was her husbands( metoprolol 25 mg).  She should have taken her atenolol 50 mg, but didn't. Plan:  Pt instructed since she had taken another BP pill to hold her own pill today and start on the correct regimen tomorrow.  She said she had spoken with the pharmacist this morning also and he had recommended the same thing.  Told pt I will inform Dr. Cathey Endow of this message, and if I need to instruct her any differently that I will call her back.  Pt is not having any side effects from taking the wrong medication. Plan:  Routed to Dr. Cathey Endow

## 2011-05-12 NOTE — Telephone Encounter (Signed)
Agree with above 

## 2011-05-21 ENCOUNTER — Ambulatory Visit: Admitting: Family Medicine

## 2011-05-23 ENCOUNTER — Encounter: Payer: Self-pay | Admitting: Family Medicine

## 2011-05-24 ENCOUNTER — Encounter: Payer: Self-pay | Admitting: Family Medicine

## 2011-05-28 ENCOUNTER — Encounter: Payer: Self-pay | Admitting: Family Medicine

## 2011-05-28 ENCOUNTER — Ambulatory Visit (INDEPENDENT_AMBULATORY_CARE_PROVIDER_SITE_OTHER): Admitting: Family Medicine

## 2011-05-28 VITALS — BP 100/65 | HR 65 | Ht 63.0 in | Wt 184.0 lb

## 2011-05-28 DIAGNOSIS — E119 Type 2 diabetes mellitus without complications: Secondary | ICD-10-CM

## 2011-05-28 DIAGNOSIS — E039 Hypothyroidism, unspecified: Secondary | ICD-10-CM

## 2011-05-28 DIAGNOSIS — I10 Essential (primary) hypertension: Secondary | ICD-10-CM

## 2011-05-28 MED ORDER — OLMESARTAN MEDOXOMIL 40 MG PO TABS: 40.0000 mg | ORAL_TABLET | Freq: Every day | ORAL | Status: DC

## 2011-05-28 NOTE — Assessment & Plan Note (Signed)
BP is a little low and she is feeling a bit lightheaded.  Will change her Benicar HCTZ to plain benicar which should help.  Recheck in 4 mos.

## 2011-05-28 NOTE — Assessment & Plan Note (Signed)
TSH order printed to update  In Aug.

## 2011-05-28 NOTE — Patient Instructions (Signed)
A1C 5.7 - normal. Urine microalbumin normal.  Change Benicar/HCTZ to plain Benicar 40 mg/ day.  Call if any problems.  Update TSH in Aug. Will call you w/ results.  Return for f/u diabetes and BP in 4 mos.

## 2011-05-28 NOTE — Assessment & Plan Note (Signed)
A1c perfect at 5.7.  Will continue current meds.  Urine microalbumin is normal today.  Eye exam and labs are UTD.  Continue to work on wt loss, diet and regular exercise.

## 2011-05-28 NOTE — Progress Notes (Signed)
  Subjective:    Patient ID: Emma Craig, female    DOB: [birth year], 60 y.o.   MRN: [id number]  HPI  60 yo WF presents for f/u diabetes and HTN today.  She is doing well on her meds.  She is due to have her TSH rechecked in Aug.  She denies CP or DOE.  She is not checking her sugars at home.  She is on a new diet and has lost 6 lbs already.  She is still clogging 2 x a wk for exercise.  She is due for an A1C and urine microalbumin today.  BP 100/65  Pulse 65  Ht 5\' 3"  (1.6 m)  Wt 184 lb (83.462 kg)  BMI 32.59 kg/m2  SpO2 97%    Review of Systems  Constitutional: Negative for fatigue.  Eyes: Negative for visual disturbance.  Respiratory: Negative for shortness of breath.   Cardiovascular: Negative for chest pain and leg swelling.  Genitourinary: Negative for frequency.  Neurological: Positive for light-headedness (with position changes).  Psychiatric/Behavioral: Negative for dysphoric mood.       Objective:   Physical Exam  Constitutional: She appears well-developed and well-nourished. No distress.       Truncal obesity  HENT:  Mouth/Throat: Oropharynx is clear and moist.  Eyes: Conjunctivae are normal.  Neck: No thyromegaly present.  Cardiovascular: Normal rate, regular rhythm and normal heart sounds.   No murmur heard. Pulmonary/Chest: Effort normal and breath sounds normal. No respiratory distress.  Musculoskeletal: She exhibits no edema.  Skin: Skin is warm and dry.  Psychiatric: She has a normal mood and affect.          Assessment & Plan:

## 2011-07-03 ENCOUNTER — Telehealth: Payer: Self-pay | Admitting: Family Medicine

## 2011-07-03 LAB — TSH: TSH: 1.448 u[IU]/mL (ref 0.350–4.500)

## 2011-07-03 NOTE — Telephone Encounter (Signed)
Pls let pt know that her thyroid looks perfect on current dose of thyroid medication.  cotninue and recheck in 6 mos.

## 2011-07-08 NOTE — Telephone Encounter (Signed)
Pt notified of results. KJ LPN 

## 2011-07-09 ENCOUNTER — Other Ambulatory Visit: Payer: Self-pay | Admitting: Family Medicine

## 2011-08-25 ENCOUNTER — Other Ambulatory Visit: Payer: Self-pay | Admitting: *Deleted

## 2011-08-25 MED ORDER — GLIMEPIRIDE 2 MG PO TABS: 2.0000 mg | ORAL_TABLET | Freq: Every day | ORAL | Status: DC

## 2011-09-30 HISTORY — PX: CATARACT EXTRACTION: SUR2

## 2011-10-01 ENCOUNTER — Ambulatory Visit (INDEPENDENT_AMBULATORY_CARE_PROVIDER_SITE_OTHER): Admitting: Family Medicine

## 2011-10-01 ENCOUNTER — Encounter: Payer: Self-pay | Admitting: Family Medicine

## 2011-10-01 DIAGNOSIS — E119 Type 2 diabetes mellitus without complications: Secondary | ICD-10-CM

## 2011-10-01 DIAGNOSIS — I10 Essential (primary) hypertension: Secondary | ICD-10-CM

## 2011-10-01 MED ORDER — AMLODIPINE-ATORVASTATIN 10-80 MG PO TABS: 1.0000 | ORAL_TABLET | Freq: Every day | ORAL | Status: DC

## 2011-10-01 NOTE — Patient Instructions (Signed)
Stop the amaryl.  Work on diet and exercise.

## 2011-10-01 NOTE — Progress Notes (Signed)
  Subjective:    Patient ID: Emma Craig, female    DOB: [birth year], 60 y.o.   MRN: [id number]  Diabetes She presents for her follow-up diabetic visit. She has type 2 diabetes mellitus. Her disease course has been stable. (Not checking her sugars but not asyptomatic.  ) Pertinent negatives for diabetes include no blurred vision, no foot ulcerations, no polydipsia, no polyphagia and no polyuria. Symptoms are stable. Risk factors for coronary artery disease include diabetes mellitus. She is compliant with treatment all of the time. She participates in exercise intermittently. An ACE inhibitor/angiotensin II receptor blocker is being taken. Eye exam is current.  Hypertension This is a chronic problem. The current episode started more than 1 year ago. The problem is unchanged. The problem is uncontrolled. Pertinent negatives include no blurred vision or shortness of breath. There are no associated agents to hypertension. Risk factors for coronary artery disease include obesity, post-menopausal state and dyslipidemia (recent weight gain. ). There are no compliance problems.       Review of Systems  Eyes: Negative for blurred vision.  Respiratory: Negative for shortness of breath.   Genitourinary: Negative for polyuria.  Hematological: Negative for polydipsia and polyphagia.       Objective:   Physical Exam  Constitutional: She is oriented to person, place, and time. She appears well-developed and well-nourished.  HENT:  Head: Normocephalic and atraumatic.  Neck: Neck supple. No thyromegaly present.  Cardiovascular: Normal rate, regular rhythm and normal heart sounds.   Pulmonary/Chest: Effort normal and breath sounds normal.  Musculoskeletal: She exhibits no edema.  Lymphadenopathy:    She has no cervical adenopathy.  Neurological: She is alert and oriented to person, place, and time.  Skin: Skin is warm and dry.  Psychiatric: She has a normal mood and affect. Her behavior is normal.           Assessment & Plan:  Diabetes - Well controlled We discussed her recent weight gain. She plans on making changes and getting her weight back down. Her a1C looks good even with her recent weight gain. Will stop her amaryl since she feels she does have lows occ and have her f/u in 3 mo. Monofilament exam done today. Eye exam up to date. Call if having lows.  HTN - BP up a little today. Work on diet and exercise and if not at goal in 3 mo will adjust med.

## 2011-10-08 ENCOUNTER — Other Ambulatory Visit: Payer: Self-pay | Admitting: *Deleted

## 2011-10-08 MED ORDER — AMLODIPINE-ATORVASTATIN 10-80 MG PO TABS: 1.0000 | ORAL_TABLET | Freq: Every day | ORAL | Status: DC

## 2011-12-07 ENCOUNTER — Other Ambulatory Visit: Payer: Self-pay | Admitting: *Deleted

## 2011-12-07 MED ORDER — LEVOXYL 75 MCG PO TABS: 75.0000 ug | ORAL_TABLET | Freq: Every day | ORAL | Status: DC

## 2011-12-22 ENCOUNTER — Other Ambulatory Visit: Payer: Self-pay | Admitting: *Deleted

## 2011-12-22 MED ORDER — OLMESARTAN MEDOXOMIL 40 MG PO TABS: 40.0000 mg | ORAL_TABLET | Freq: Every day | ORAL | Status: DC

## 2011-12-31 ENCOUNTER — Encounter: Payer: Self-pay | Admitting: Family Medicine

## 2011-12-31 ENCOUNTER — Ambulatory Visit (INDEPENDENT_AMBULATORY_CARE_PROVIDER_SITE_OTHER): Admitting: Family Medicine

## 2011-12-31 VITALS — BP 146/81 | HR 70 | Temp 98.4°F | Wt 199.0 lb

## 2011-12-31 DIAGNOSIS — I10 Essential (primary) hypertension: Secondary | ICD-10-CM

## 2011-12-31 DIAGNOSIS — E669 Obesity, unspecified: Secondary | ICD-10-CM

## 2011-12-31 DIAGNOSIS — E039 Hypothyroidism, unspecified: Secondary | ICD-10-CM

## 2011-12-31 DIAGNOSIS — E119 Type 2 diabetes mellitus without complications: Secondary | ICD-10-CM

## 2011-12-31 MED ORDER — METOPROLOL SUCCINATE ER 50 MG PO TB24: 50.0000 mg | ORAL_TABLET | Freq: Every day | ORAL | Status: DC

## 2011-12-31 NOTE — Progress Notes (Signed)
  Subjective:    Patient ID: Emma Craig, female    DOB: [birth year], 61 y.o.   MRN: [id number]  Hypertension This is a chronic problem. The current episode started more than 1 year ago. The problem is unchanged. Pertinent negatives include no blurred vision, chest pain, headaches, peripheral edema or shortness of breath. There are no associated agents to hypertension. Past treatments include angiotensin blockers, beta blockers and calcium channel blockers. The current treatment provides moderate improvement. There are no compliance problems (she has been getting regular exercise. ).   Diabetes She presents for her follow-up diabetic visit. She has type 2 diabetes mellitus. Her disease course has been stable. There are no hypoglycemic associated symptoms. Pertinent negatives for hypoglycemia include no headaches. Pertinent negatives for diabetes include no blurred vision, no chest pain, no foot paresthesias, no foot ulcerations, no polydipsia, no polyphagia, no polyuria and no visual change. Symptoms are stable. Her weight is stable. She participates in exercise three times a week. Home blood sugar record trend: doesnt check sugars.  An ACE inhibitor/angiotensin II receptor blocker is being taken. Eye exam is current.    Obesity-she is very concerned about her weight. She would like to discuss a possible weight loss medication. She said she tried several diets in the past and thus a loose about 10 pounds and then will gain it back. She has done Weight Watchers 4 times in the past. She currently has been exercising 5 days per week and was expecting to have lost weight when she came in today but instead her weight is actually up.  Review of Systems  Eyes: Negative for blurred vision.  Respiratory: Negative for shortness of breath.   Cardiovascular: Negative for chest pain.  Genitourinary: Negative for polyuria.  Neurological: Negative for headaches.  Hematological: Negative for polydipsia and polyphagia.        Objective:   Physical Exam  Constitutional: She is oriented to person, place, and time. She appears well-developed and well-nourished.  HENT:  Head: Normocephalic and atraumatic.  Cardiovascular: Normal rate, regular rhythm and normal heart sounds.   Pulmonary/Chest: Effort normal and breath sounds normal.  Musculoskeletal: She exhibits no edema.  Neurological: She is alert and oriented to person, place, and time.  Skin: Skin is warm and dry.  Psychiatric: She has a normal mood and affect. Her behavior is normal.          Assessment & Plan:  HTN-Not well controlled will change atenolol once a day to metoporlol 50 XL. Work on diet and weight loss. F/U in 1 mo to recheck.   Hypothyroid - Recheck TSH to make sure not contributing to her weight loss.   DM- She is off meds and doing well. Downgrade to insulin resistance.  Recheck in 6 months.  Work on diet and exercise.  Obesity- she has been working out 5 days per week.  Discussed options such at See. Dr. Cathey Endow  Bariatric clinic, weight watchers, working w/ a nutritionist. She will think about it and let me know.

## 2012-01-04 LAB — LIPID PANEL
Cholesterol: 174 mg/dL (ref 0–200)
HDL: 47 mg/dL (ref >39)
Triglycerides: 152 mg/dL — ABNORMAL HIGH (ref <150)

## 2012-01-04 LAB — COMPLETE METABOLIC PANEL WITH GFR
ALT: 26 U/L (ref 0–35)
BUN: 15 mg/dL (ref 6–23)
CO2: 27 mEq/L (ref 19–32)
Creat: 0.83 mg/dL (ref 0.50–1.10)
GFR, Est African American: >89 mL/min
GFR, Est Non African American: 78 mL/min
Glucose, Bld: 105 mg/dL — ABNORMAL HIGH (ref 70–99)
Total Bilirubin: 0.4 mg/dL (ref 0.3–1.2)

## 2012-01-04 LAB — TSH: TSH: 1.994 u[IU]/mL (ref 0.350–4.500)

## 2012-01-14 ENCOUNTER — Ambulatory Visit (INDEPENDENT_AMBULATORY_CARE_PROVIDER_SITE_OTHER): Admitting: Physician Assistant

## 2012-01-14 ENCOUNTER — Encounter: Payer: Self-pay | Admitting: Physician Assistant

## 2012-01-14 VITALS — BP 141/76 | HR 92 | Ht 61.0 in | Wt 199.0 lb

## 2012-01-14 DIAGNOSIS — S8392XA Sprain of unspecified site of left knee, initial encounter: Secondary | ICD-10-CM

## 2012-01-14 DIAGNOSIS — IMO0002 Reserved for concepts with insufficient information to code with codable children: Secondary | ICD-10-CM

## 2012-01-14 MED ORDER — HYDROCODONE-ACETAMINOPHEN 5-325 MG PO TABS: 1.0000 | ORAL_TABLET | Freq: Four times a day (QID) | ORAL | Status: AC | PRN

## 2012-01-14 NOTE — Patient Instructions (Addendum)
See Handout. Stretch before all activities. Hydrocodone alternating with Tylenol OTC for pain because NSAIDs will raise blood pressure. Consider ACE bandage for support. Need to REST do not exercise for at least 1 week or until you feel you can tolerate it without pain. Follow up in 1 month.  Knee Sprain You have a knee sprain. Sprains are painful injuries to the joints. A sprain is a partial or complete tearing of ligaments. Ligaments are tough, fibrous tissues that hold bones together at the joints. A strain (sprain) has occurred when a ligament is stretched or damaged. This injury may take several weeks to heal. This is often the same length of time as a bone fracture (break in bone) takes to heal. Even though a fracture (bone break) may not have occurred, the recovery times may be similar. HOME CARE INSTRUCTIONS   Rest the injured area for as long as directed by your caregiver. Then slowly start using the joint as directed by your caregiver and as the pain allows. If an ace bandage has been applied today, it should be removed and reapplied every 3 to 4 hours. It should not be applied tightly, but firmly enough to keep swelling down. Watch toes and feet for swelling, bluish discoloration, coldness, numbness or excessive pain. If any of these symptoms occur, remove the ace bandage and reapply more loosely.If these symptoms persist, seek medical attention.   For the first 24 hours, lie down. Keep the injured extremity elevated on two pillows.   Apply ice to the injured area for 15 to 20 minutes every couple hours. Repeat this 3 to 4 times per day for the first 48 hours. Put the ice in a plastic bag and place a towel between the bag of ice and your skin.   Wear any splinting, casting, or elastic bandage applications as instructed.   Only take over-the-counter or prescription medicines for pain, discomfort, or fever as directed by your caregiver. Do not use aspirin immediately after the injury unless  instructed by your caregiver. Aspirin can cause increased bleeding and bruising of the tissues.   If you were given crutches, continue to use them as instructed. Do not resume weight bearing on the affected extremity until instructed.  Persistent pain and inability to use the injured area as directed for more than 2 to 3 days are warning signs. If this happens you should see a caregiver for a follow-up visit as soon as possible. Initially, a hairline fracture (this is the same as a broken bone) may not be evident on x-rays. Persistent pain and swelling indicate that further evaluation, non-weight bearing (use of crutches as instructed), and/or further x-rays are indicated. X-rays may sometimes not show a small fracture until a week or ten days later. Make a follow-up appointment with your own caregiver or one to whom we have referred you. A radiologist (specialist in reading x-rays) may re-read your X-rays. Make sure you know how you are to get your x-ray results. Do not assume everything is normal if you do not hear from [place]. SEEK MEDICAL CARE IF:   Bruising, swelling, or pain increases.   You have cold or numb toes   You have continuing difficulty or pain with walking.  SEEK IMMEDIATE MEDICAL CARE IF:   Your toes are cold, numb or blue.   The pain is not responding to medications and continues to stay the same or get worse.  MAKE SURE YOU:   Understand these instructions.   Will watch your  condition.   Will get help right away if you are not doing well or get worse.

## 2012-01-14 NOTE — Progress Notes (Signed)
  Subjective:    Patient ID: Emma Craig, female    DOB: Jun 06, 1951, 61 y.o.   MRN: [id number]  HPI Patient presents to the clinic with left knee pain for 3 weeks. Patient has been very active she clogs, does zumba, and plays a"pickle game" as a hobby. The injury happened 3 weeks ago when she was cloging. She was doing removed and she kicked her left leg up and she felt tension and pain. She did not fall she did not hear any type of pop. She has iced her knee once or twice but it did not provide any relief. She is taken no medication to help the pain. She continues to clog and do other activities even though she is in pain. She reports the pain to be behind her knee as well as the lateral to her knee. The pain increases with extension and when she's not used the leg. The pain is constant and described as a dull ache. She denies any numbness or tingling of her left leg. The pain sometimes radiates up to her left buttocks.   Review of Systems     Objective:   Physical Exam  Constitutional: She is oriented to person, place, and time. She appears well-developed and well-nourished.  Musculoskeletal:       Left knee had normal ROM. There was not tenderness noted over joint space anteriorly. There was tenderness with palpation of the LCL. McMurray's negative. Anterior Drawers test negative. Strength 5/5.   Neurological: She is alert and oriented to person, place, and time. She has normal reflexes.       DTR at knee was 2+ bilaterally.          Assessment & Plan:  Knee sprain- Can not take NSAIDs due to blood pressure problems. Gave Hydrocodone to alternate with Tylenol to take for pain. Gave handout on knee sprains and symptomatic care to be done. Instructed patient to REST/ICE/Elevate and take a break from clogging and Zumba for a few weeks until sprain is healed. When she starts back to exercising she was told to wear ACE bandage or OTC knee brace over knee while exercising.   Hypertension-  Elevated today but down from last visit. Started on Metformin at last visit. Is supposed to follow up with Dr. Linford Arnold in March. Will wait to march to make any adjustments on medication.

## 2012-02-04 ENCOUNTER — Encounter: Payer: Self-pay | Admitting: Family Medicine

## 2012-02-04 ENCOUNTER — Ambulatory Visit (INDEPENDENT_AMBULATORY_CARE_PROVIDER_SITE_OTHER): Admitting: Family Medicine

## 2012-02-04 VITALS — BP 130/77 | HR 94 | Ht 61.5 in | Wt 201.0 lb

## 2012-02-04 DIAGNOSIS — I10 Essential (primary) hypertension: Secondary | ICD-10-CM

## 2012-02-04 NOTE — Progress Notes (Signed)
  Subjective:    Patient ID: Emma Craig, female    DOB: Aug 28, 1951, 61 y.o.   MRN: [id number]  HPI HTN - Here to f/u on BP.  We adjusted her meds.  Says had HA for the first 3 weeks and that has finally resolved. No CP or SOB.  Says polans on seeing Dr. Cathey Endow on Monday for Weight loss. She does get some exercise.    Review of Systems     Objective:   Physical Exam  Constitutional: She is oriented to person, place, and time. She appears well-developed and well-nourished.  HENT:  Head: Normocephalic and atraumatic.  Cardiovascular: Normal rate, regular rhythm and normal heart sounds.   Pulmonary/Chest: Effort normal and breath sounds normal.  Neurological: She is alert and oriented to person, place, and time.  Skin: Skin is warm and dry.  Psychiatric: She has a normal mood and affect. Her behavior is normal.          Assessment & Plan:  HTN -  BP looks great today. At goal. Followup in 5 months. She'll be due for A1c at that point in time since we've downgraded her to insulin resistance at her last office visit on diabetes. She does well with the weight loss in the next 5 months. After Dr. Doylene Canning will be able to adjust her medications down if she does not need as much medication as she is losing weight. I am excited that she is doing this well.

## 2012-03-29 ENCOUNTER — Other Ambulatory Visit: Payer: Self-pay | Admitting: Family Medicine

## 2012-06-02 ENCOUNTER — Other Ambulatory Visit: Payer: Self-pay | Admitting: *Deleted

## 2012-06-02 MED ORDER — LEVOXYL 75 MCG PO TABS: 75.0000 ug | ORAL_TABLET | Freq: Every day | ORAL | Status: DC

## 2012-06-29 ENCOUNTER — Other Ambulatory Visit: Payer: Self-pay | Admitting: *Deleted

## 2012-06-29 MED ORDER — METOPROLOL SUCCINATE ER 50 MG PO TB24: 50.0000 mg | ORAL_TABLET | Freq: Every day | ORAL | Status: DC

## 2012-07-19 ENCOUNTER — Other Ambulatory Visit: Payer: Self-pay | Admitting: Family Medicine

## 2012-07-21 ENCOUNTER — Ambulatory Visit (INDEPENDENT_AMBULATORY_CARE_PROVIDER_SITE_OTHER): Admitting: Family Medicine

## 2012-07-21 ENCOUNTER — Encounter: Payer: Self-pay | Admitting: Family Medicine

## 2012-07-21 VITALS — BP 146/84 | HR 89 | Wt 202.0 lb

## 2012-07-21 DIAGNOSIS — I10 Essential (primary) hypertension: Secondary | ICD-10-CM

## 2012-07-21 DIAGNOSIS — E119 Type 2 diabetes mellitus without complications: Secondary | ICD-10-CM

## 2012-07-21 DIAGNOSIS — E039 Hypothyroidism, unspecified: Secondary | ICD-10-CM

## 2012-07-21 LAB — POCT UA - MICROALBUMIN: Albumin/Creatinine Ratio, Urine, POC: <30

## 2012-07-21 NOTE — Patient Instructions (Addendum)
Follow up for diabetes and blood pressure with me in 3 months (10/21/12).    1.5 Gram Low Sodium Diet A 1.5 gram sodium diet restricts the amount of sodium in the diet to no more than 1.5 g or 1500 mg daily. The American Heart Association recommends Americans over the age of 49 to consume no more than 1500 mg of sodium each day to reduce the risk of developing high blood pressure. Research also shows that limiting sodium may reduce heart attack and stroke risk. Many foods contain sodium for flavor and sometimes as a preservative. When the amount of sodium in a diet needs to be low, it is important to know what to look for when choosing foods and drinks. The following includes some information and guidelines to help make it easier for you to adapt to a low sodium diet. QUICK TIPS  Do not add salt to food.   Avoid convenience items and fast food.   Choose unsalted snack foods.   Buy lower sodium products, often labeled as "lower sodium" or "no salt added."   Check food labels to learn how much sodium is in 1 serving.   When eating at a restaurant, ask that your food be prepared with less salt or none, if possible.  READING FOOD LABELS FOR SODIUM INFORMATION The nutrition facts label is a good place to find how much sodium is in foods. Look for products with no more than 400 mg of sodium per serving. Remember that 1.5 g = 1500 mg. The food label may also list foods as:  Sodium-free: Less than 5 mg in a serving.   Very low sodium: 35 mg or less in a serving.   Low-sodium: 140 mg or less in a serving.   Light in sodium: 50% less sodium in a serving. For example, if a food that usually has 300 mg of sodium is changed to become light in sodium, it will have 150 mg of sodium.   Reduced sodium: 25% less sodium in a serving. For example, if a food that usually has 400 mg of sodium is changed to reduced sodium, it will have 300 mg of sodium.  CHOOSING FOODS Grains  Avoid: Salted crackers and  snack items. Some cereals, including instant hot cereals. Bread stuffing and biscuit mixes. Seasoned rice or pasta mixes.   Choose: Unsalted snack items. Low-sodium cereals, oats, puffed wheat and rice, shredded wheat. English muffins and bread. Pasta.  Meats  Avoid: Salted, canned, smoked, spiced, pickled meats, including fish and poultry. Bacon, ham, sausage, cold cuts, hot dogs, anchovies.   Choose: Low-sodium canned tuna and salmon. Fresh or frozen meat, poultry, and fish.  Dairy  Avoid: Processed cheese and spreads. Cottage cheese. Buttermilk and condensed milk. Regular cheese.   Choose: Milk. Low-sodium cottage cheese. Yogurt. Sour cream. Low-sodium cheese.  Fruits and Vegetables  Avoid: Regular canned vegetables. Regular canned tomato sauce and paste. Frozen vegetables in sauces. Olives. Rosita Fire. Relishes. Sauerkraut.   Choose: Low-sodium canned vegetables. Low-sodium tomato sauce and paste. Frozen or fresh vegetables. Fresh and frozen fruit.  Condiments  Avoid: Canned and packaged gravies. Worcestershire sauce. Tartar sauce. Barbecue sauce. Soy sauce. Steak sauce. Ketchup. Onion, garlic, and table salt. Meat flavorings and tenderizers.   Choose: Fresh and dried herbs and spices. Low-sodium varieties of mustard and ketchup. Lemon juice. Tabasco sauce. Horseradish.  SAMPLE 1.5 GRAM SODIUM MEAL PLAN Breakfast / Sodium (mg)  1 cup low-fat milk / 143 mg   1 whole-wheat English muffin /  240 mg   1 tbs heart-healthy margarine / 153 mg   1 hard-boiled egg / 139 mg   1 small orange / 0 mg  Lunch / Sodium (mg)  1 cup raw carrots / 76 mg   2 tbs no salt added peanut butter / 5 mg   2 slices whole-wheat bread / 270 mg   1 tbs jelly / 6 mg    cup red grapes / 2 mg  Dinner / Sodium (mg)  1 cup whole-wheat pasta / 2 mg   1 cup low-sodium tomato sauce / 73 mg   3 oz lean ground beef / 57 mg   1 small side salad (1 cup raw spinach leaves,  cup cucumber,  cup yellow  bell pepper) with 1 tsp olive oil and 1 tsp red wine vinegar / 25 mg  Snack / Sodium (mg)  1 container low-fat vanilla yogurt / 107 mg   3 graham cracker squares / 127 mg  Nutrient Analysis  Calories: 1745   Protein: 75 g   Carbohydrate: 237 g   Fat: 57 g   Sodium: 1425 mg  Document Released: 11/15/2005 Document Revised: 11/04/2011 Document Reviewed: 02/16/2010 [facility] Patient Information 2012 [place], [place].DASH Diet The DASH diet stands for "Dietary Approaches to Stop Hypertension." It is a healthy eating plan that has been shown to reduce high blood pressure (hypertension) in as little as 14 days, while also possibly providing other significant health benefits. These other health benefits include reducing the risk of breast cancer after menopause and reducing the risk of type 2 diabetes, heart disease, colon cancer, and stroke. Health benefits also include weight loss and slowing kidney failure in patients with chronic kidney disease.   DIET GUIDELINES  Limit salt (sodium). Your diet should contain less than 1500 mg of sodium daily.   Limit refined or processed carbohydrates. Your diet should include mostly whole grains. Desserts and added sugars should be used sparingly.   Include small amounts of heart-healthy fats. These types of fats include nuts, oils, and tub margarine. Limit saturated and trans fats. These fats have been shown to be harmful in the body.  CHOOSING FOODS   The following food groups are based on a 2000 calorie diet. See your Registered Dietitian for individual calorie needs. Grains and Grain Products (6 to 8 servings daily)  Eat More Often: Whole-wheat bread, brown rice, whole-grain or wheat pasta, quinoa, popcorn without added fat or salt (air popped).   Eat Less Often: White bread, white pasta, white rice, cornbread.  Vegetables (4 to 5 servings daily)  Eat More Often: Fresh, frozen, and canned vegetables. Vegetables may be raw, steamed, roasted, or  grilled with a minimal amount of fat.   Eat Less Often/Avoid: Creamed or fried vegetables. Vegetables in a cheese sauce.  Fruit (4 to 5 servings daily)  Eat More Often: All fresh, canned (in natural juice), or frozen fruits. Dried fruits without added sugar. One hundred percent fruit juice ( cup [237 mL] daily).   Eat Less Often: Dried fruits with added sugar. Canned fruit in light or heavy syrup.  [employer], Fish, and Poultry (2 servings or less daily. One serving is 3 to 4 oz [85-114 g]).  Eat More Often: Ninety percent or leaner ground beef, tenderloin, sirloin. Round cuts of beef, chicken breast, [place] breast. All fish. Grill, bake, or broil your meat. Nothing should be fried.   Eat Less Often/Avoid: Fatty cuts of meat, [place], or chicken leg, thigh, or wing.  Fried cuts of meat or fish.  Dairy (2 to 3 servings)  Eat More Often: Low-fat or fat-free milk, low-fat plain or light yogurt, reduced-fat or part-skim cheese.   Eat Less Often/Avoid: Milk (whole, 2%, skim, or chocolate). Whole milk yogurt. Full-fat cheeses.  Nuts, Seeds, and Legumes (4 to 5 servings per week)  Eat More Often: All without added salt.   Eat Less Often/Avoid: Salted nuts and seeds, canned beans with added salt.  Fats and Sweets (limited)  Eat More Often: Vegetable oils, tub margarines without trans fats, sugar-free gelatin. Mayonnaise and salad dressings.   Eat Less Often/Avoid: Coconut oils, palm oils, butter, stick margarine, cream, half and half, cookies, candy, pie.  FOR MORE INFORMATION The Dash Diet Eating Plan: www.dashdiet.org Document Released: 11/04/2011 Document Reviewed: 10/25/2011 [facility] Patient Information 2012 [place], [place].

## 2012-07-21 NOTE — Progress Notes (Signed)
  Subjective:    Patient ID: Emma Craig, female    DOB: 07/01/1951, 61 y.o.   MRN: [id number]  HPI DM- No checking sugars. No lows.  No cuts or sores that aren't healing well. She is diet controlled.    HTN- No CP or SOB.  Her weight is up some.  She it taking her Benicar and metoprolol and caduet.    Hypothyroid - energy level is ok. Gained about 10 lbs on vacation.     Review of Systems     Objective:   Physical Exam  Constitutional: She is oriented to person, place, and time. She appears well-developed and well-nourished.  HENT:  Head: Normocephalic and atraumatic.  Cardiovascular: Normal rate, regular rhythm and normal heart sounds.   Pulmonary/Chest: Effort normal and breath sounds normal.  Neurological: She is alert and oriented to person, place, and time.  Skin: Skin is warm and dry.  Psychiatric: She has a normal mood and affect. Her behavior is normal.          Assessment & Plan:  DM-A1C is up this time. Over 6.5 . Discussed the need for medication.  She really wants to hold off and owrk on diet and exercise. Eye exam is uptodate.    HTN - Elevated today. Disucssed options. Inc BP is mostly from her wight gain I suspect.  Discussed eating a low salt.  Due for BMP.  Wants to hold off on med changes.     Hypothyroid - dong well. Says she wants hold off on checkin it today.

## 2012-08-18 ENCOUNTER — Ambulatory Visit (INDEPENDENT_AMBULATORY_CARE_PROVIDER_SITE_OTHER): Admitting: Family Medicine

## 2012-08-18 VITALS — BP 136/74 | HR 85

## 2012-08-18 DIAGNOSIS — I10 Essential (primary) hypertension: Secondary | ICD-10-CM

## 2012-08-18 NOTE — Progress Notes (Signed)
Pt informed

## 2012-08-18 NOTE — Progress Notes (Signed)
  Subjective:    Patient ID: Emma Craig, female    DOB: 1951-10-06, 61 y.o.   MRN: [id number] Pt denies CP, SOB, dizziness, or heart palpitations. taking meds as directed without problems. Denies med side effects. 5 min spent with pt.  HPI    Review of Systems     Objective:   Physical Exam        Assessment & Plan:  HTN -  Please tell her good job. She is at goal today. We will hold off on making any changes to her regimen. Keep regularly scheduled followup. Nani Gasser, MD

## 2012-08-28 ENCOUNTER — Other Ambulatory Visit: Payer: Self-pay | Admitting: Family Medicine

## 2012-08-29 ENCOUNTER — Telehealth: Payer: Self-pay | Admitting: *Deleted

## 2012-08-29 MED ORDER — LEVOTHYROXINE SODIUM 75 MCG PO CAPS: 75.0000 ug | ORAL_CAPSULE | Freq: Every day | ORAL | Status: DC

## 2012-08-29 NOTE — Telephone Encounter (Signed)
We can switch the generic. That is fine. She'll need to recheck her thyroid in 4-6 weeks to make sure that it's well-controlled. The Synthroid and the generic are not completely equivalent.Sent to pharm.

## 2012-08-29 NOTE — Telephone Encounter (Signed)
Pharmacy calls and states that [employer] will not pay for brand name Synthroid but will pay for the generic. Is this ok with you?

## 2012-08-30 ENCOUNTER — Other Ambulatory Visit: Payer: Self-pay | Admitting: *Deleted

## 2012-09-27 ENCOUNTER — Other Ambulatory Visit: Payer: Self-pay | Admitting: Family Medicine

## 2012-10-18 LAB — BASIC METABOLIC PANEL WITH GFR
CO2: 25 mEq/L (ref 19–32)
Calcium: 9.6 mg/dL (ref 8.4–10.5)
Chloride: 103 mEq/L (ref 96–112)
Glucose, Bld: 104 mg/dL — ABNORMAL HIGH (ref 70–99)
Potassium: 4.9 mEq/L (ref 3.5–5.3)
Sodium: 140 mEq/L (ref 135–145)

## 2012-10-18 NOTE — Progress Notes (Signed)
Quick Note:  All labs are normal. ______ 

## 2012-10-20 ENCOUNTER — Ambulatory Visit (INDEPENDENT_AMBULATORY_CARE_PROVIDER_SITE_OTHER): Admitting: Family Medicine

## 2012-10-20 ENCOUNTER — Encounter: Payer: Self-pay | Admitting: Family Medicine

## 2012-10-20 VITALS — BP 137/77 | HR 99 | Ht 61.2 in | Wt 193.0 lb

## 2012-10-20 DIAGNOSIS — E039 Hypothyroidism, unspecified: Secondary | ICD-10-CM

## 2012-10-20 DIAGNOSIS — E119 Type 2 diabetes mellitus without complications: Secondary | ICD-10-CM

## 2012-10-20 DIAGNOSIS — I10 Essential (primary) hypertension: Secondary | ICD-10-CM

## 2012-10-20 LAB — POCT GLYCOSYLATED HEMOGLOBIN (HGB A1C): Hemoglobin A1C: 6.6

## 2012-10-20 MED ORDER — LEVOTHYROXINE SODIUM 75 MCG PO TABS: 75.0000 ug | ORAL_TABLET | Freq: Every day | ORAL | Status: DC

## 2012-10-20 NOTE — Progress Notes (Signed)
  Subjective:    Patient ID: Emma Craig, female    DOB: 03/31/1951, 61 y.o.   MRN: [id number]  HPI HTN -No CP or SOB.  Has beeen dieting and exercising. Has lost 9 lbs.   Hypothryodi - no fatigue. No skin or hair changes  DM- she was previously impaired fasting glucose that back in August her A1c across 6.5 and was actually 6.7. We decided to really work hard on diet and exercise which she has been doing has lost 9 pounds. She's due to recheck today. If her A1c is still 6.5 or higher then we discussed starting medication. No hypoglycemic events.  Review of Systems     Objective:   Physical Exam  Constitutional: She is oriented to person, place, and time. She appears well-developed and well-nourished.  HENT:  Head: Normocephalic and atraumatic.  Cardiovascular: Normal rate, regular rhythm and normal heart sounds.   Pulmonary/Chest: Effort normal and breath sounds normal.  Neurological: She is alert and oriented to person, place, and time.  Skin: Skin is warm and dry.  Psychiatric: She has a normal mood and affect. Her behavior is normal.          Assessment & Plan:  HTN - WEll controlled. F/U in 6 months. Gradually her on her 9 pound weight loss. She is doing fantastic with her diet and exercise.  Hypothyroid - Will recheck level today.  Asymptomatic. Refill meds today.   DM - A1C is a little better at 6.6.  Discussed I really want her to start metformin but she wants to really continue to work on diet an dexercise for 3 more months to try to get her A1C down to under 6.5.

## 2012-10-20 NOTE — Addendum Note (Signed)
Addended by: Judie Petit A on: 10/20/2012 08:57 AM   Modules accepted: Orders

## 2012-10-20 NOTE — Patient Instructions (Signed)
You will be due for cholesterol and thyroid in February.

## 2012-11-03 ENCOUNTER — Other Ambulatory Visit: Payer: Self-pay | Admitting: Family Medicine

## 2012-12-26 ENCOUNTER — Other Ambulatory Visit: Payer: Self-pay | Admitting: Family Medicine

## 2013-01-26 ENCOUNTER — Encounter: Payer: Self-pay | Admitting: Family Medicine

## 2013-01-26 ENCOUNTER — Ambulatory Visit (INDEPENDENT_AMBULATORY_CARE_PROVIDER_SITE_OTHER): Admitting: Family Medicine

## 2013-01-26 VITALS — BP 165/75 | HR 86 | Ht 61.6 in | Wt 190.0 lb

## 2013-01-26 DIAGNOSIS — E039 Hypothyroidism, unspecified: Secondary | ICD-10-CM

## 2013-01-26 DIAGNOSIS — E785 Hyperlipidemia, unspecified: Secondary | ICD-10-CM

## 2013-01-26 DIAGNOSIS — I10 Essential (primary) hypertension: Secondary | ICD-10-CM

## 2013-01-26 DIAGNOSIS — E8881 Metabolic syndrome: Secondary | ICD-10-CM

## 2013-01-26 DIAGNOSIS — E119 Type 2 diabetes mellitus without complications: Secondary | ICD-10-CM

## 2013-01-26 LAB — COMPLETE METABOLIC PANEL WITH GFR
Albumin: 4.9 g/dL (ref 3.5–5.2)
CO2: 27 mEq/L (ref 19–32)
Calcium: 9.3 mg/dL (ref 8.4–10.5)
Chloride: 103 mEq/L (ref 96–112)
GFR, Est African American: 74 mL/min
GFR, Est Non African American: 64 mL/min
Glucose, Bld: 100 mg/dL — ABNORMAL HIGH (ref 70–99)
Potassium: 4 mEq/L (ref 3.5–5.3)
Sodium: 142 mEq/L (ref 135–145)
Total Protein: 7.3 g/dL (ref 6.0–8.3)

## 2013-01-26 LAB — TSH: TSH: 1.011 u[IU]/mL (ref 0.350–4.500)

## 2013-01-26 LAB — LIPID PANEL: Total CHOL/HDL Ratio: 3.2 Ratio

## 2013-01-26 MED ORDER — OLMESARTAN MEDOXOMIL 40 MG PO TABS: 40.0000 mg | ORAL_TABLET | Freq: Every day | ORAL | Status: DC

## 2013-01-26 NOTE — Progress Notes (Signed)
  Subjective:    Patient ID: Emma Craig, female    DOB: Oct 25, 1951, 62 y.o.   MRN: [id number]  HPI HTN  -  Pt denies chest pain, SOB, dizziness, or heart palpitations.  Taking meds as directed w/o problems.  Denies medication side effects.    DM- Not checking sugars at home. No hypoglycemia. No wounds that aren't healing well.  Tolerating meds well.   Hyperlipidemia- tolerating statin well. No SE of medicaiton.,   Getting spasm in her left buttock for about 6 weeks. Has been getting worse. Worse when stands for long periods. No raidation of pain.  Can happen during clogging, after vaccums.  No tylenol or heat or ice.    Hypothyroid- No skin or hair changes. No weight changes. Feels asymptomatic.   Review of Systems     Objective:   Physical Exam  Constitutional: She is oriented to person, place, and time. She appears well-developed and well-nourished.  HENT:  Head: Normocephalic and atraumatic.  Neck: Neck supple. No thyromegaly present.  Cardiovascular: Normal rate, regular rhythm and normal heart sounds.   Pulmonary/Chest: Effort normal and breath sounds normal.  Musculoskeletal: She exhibits no edema.  Low back with normal range of motion. Hip, knee, ankle strength is 5 out of 5 in both lower Schimke's. Patellar reflexes are 2+ bilaterally. Negative straight leg raise bilaterally. She's tender directly over the mid left buttocks and his performance. She had some discomfort with hip the abduction.  No pain with abduction or inversion. Nontender over the lumbar spine.  Lymphadenopathy:    She has no cervical adenopathy.  Neurological: She is alert and oriented to person, place, and time.  Skin: Skin is warm and dry.  Psychiatric: She has a normal mood and affect. Her behavior is normal.          Assessment & Plan:  HTN- Uncontrolled.  But didn't take one of her pills this AM bc prefers to take it with food.   Refills sent to pharmacy.   DM- well controlled. She's currently  maintaining a diet and is doing fantastic with this. She is on an ARB and a statin. Followup in 3-4 months.  Hyperlipidemia - Due to recheck levels. Continue statin. Call if any side effects or problems.  Left hip pain - Likely piriformis syndrome based on history and exam today. Handout given for stretches. She certainly could use an anti-inflammatory short-term but we'll need to monitor to make sure it's not increasing her blood pressure. She says she does have some Tylenol home so she would like to start with I think that could be helpful. Call if not significantly better in 3-4 weeks for further evaluation.  Hypothyroid- Due tor recheck levels. She currently feels asymptomatic.

## 2013-01-26 NOTE — Patient Instructions (Addendum)
Call if your hip is not better in 3-4 weeks.

## 2013-03-27 ENCOUNTER — Other Ambulatory Visit: Payer: Self-pay | Admitting: Family Medicine

## 2013-04-25 ENCOUNTER — Telehealth: Payer: Self-pay | Admitting: *Deleted

## 2013-04-25 NOTE — Telephone Encounter (Signed)
Sarah with [employer] calls and states that they need to change the manufacture of thyroid med this patient is on but they can not do it on their on per [place] Law. Request ok to change thyroid med manufacture

## 2013-04-25 NOTE — Telephone Encounter (Signed)
Okay to change brand on thyroid medication as long as the patient is aware that she will need to come back and have her thyroid retested in about 6 weeks just to make sure that the new medication is equally therapeutic.

## 2013-05-04 ENCOUNTER — Other Ambulatory Visit: Payer: Self-pay | Admitting: Family Medicine

## 2013-05-25 ENCOUNTER — Ambulatory Visit (INDEPENDENT_AMBULATORY_CARE_PROVIDER_SITE_OTHER): Admitting: Family Medicine

## 2013-05-25 ENCOUNTER — Encounter: Payer: Self-pay | Admitting: Family Medicine

## 2013-05-25 VITALS — BP 150/75 | HR 78 | Ht 61.6 in | Wt 189.0 lb

## 2013-05-25 DIAGNOSIS — I10 Essential (primary) hypertension: Secondary | ICD-10-CM

## 2013-05-25 DIAGNOSIS — E119 Type 2 diabetes mellitus without complications: Secondary | ICD-10-CM

## 2013-05-25 MED ORDER — METOPROLOL SUCCINATE ER 50 MG PO TB24: ORAL_TABLET | ORAL | Status: DC

## 2013-05-25 NOTE — Progress Notes (Signed)
  Subjective:    Patient ID: Emma Craig, female    DOB: 1951/04/30, 62 y.o.   MRN: [id number]  HPI DM  - no hypoglycemic events.  No wounds that aren't healing well.   HTN-  Pt denies chest pain, SOB, dizziness, or heart palpitations.  Taking meds as directed w/o problems.  Denies medication side effects.  Says skipped her meds this Am bc likes to take them with food.  Home BP ok.      Review of Systems     Objective:   Physical Exam  Constitutional: She is oriented to person, place, and time. She appears well-developed and well-nourished.  HENT:  Head: Normocephalic and atraumatic.  Cardiovascular: Normal rate, regular rhythm and normal heart sounds.   Pulmonary/Chest: Effort normal and breath sounds normal.  Neurological: She is alert and oriented to person, place, and time.  Skin: Skin is warm and dry.  Psychiatric: She has a normal mood and affect. Her behavior is normal.          Assessment & Plan:  DM- well-controlled. Hemoglobin A1c is 6.3 today. Continue current regimen. on statin, ARB, and baby ASA. She is primarily diet controlled and does not take any antidiabetic drugs. Followup in 3-4 months. She'll be due for your micron been at that time. Her eye exam is up-to-date. She will also be due for blood work at that time.  HTN - uncontrolled today but she did not take her medications as usual. She says at home her blood pressures have been well controlled we'll continue to keep an eye on this. Followup in 3-4 months.

## 2013-07-23 ENCOUNTER — Other Ambulatory Visit: Payer: Self-pay | Admitting: Family Medicine

## 2013-08-24 ENCOUNTER — Ambulatory Visit (INDEPENDENT_AMBULATORY_CARE_PROVIDER_SITE_OTHER): Admitting: Family Medicine

## 2013-08-24 ENCOUNTER — Encounter: Payer: Self-pay | Admitting: Family Medicine

## 2013-08-24 VITALS — BP 143/88 | HR 99 | Wt 192.0 lb

## 2013-08-24 DIAGNOSIS — E785 Hyperlipidemia, unspecified: Secondary | ICD-10-CM

## 2013-08-24 DIAGNOSIS — Z1211 Encounter for screening for malignant neoplasm of colon: Secondary | ICD-10-CM

## 2013-08-24 DIAGNOSIS — I10 Essential (primary) hypertension: Secondary | ICD-10-CM

## 2013-08-24 DIAGNOSIS — E119 Type 2 diabetes mellitus without complications: Secondary | ICD-10-CM

## 2013-08-24 LAB — POCT UA - MICROALBUMIN
Albumin/Creatinine Ratio, Urine, POC: <30
Microalbumin Ur, POC: 10 mg/L

## 2013-08-24 LAB — POCT GLYCOSYLATED HEMOGLOBIN (HGB A1C): Hemoglobin A1C: 6.3

## 2013-08-24 MED ORDER — METOPROLOL SUCCINATE ER 100 MG PO TB24: ORAL_TABLET | ORAL | Status: DC

## 2013-08-24 MED ORDER — OLMESARTAN MEDOXOMIL 40 MG PO TABS: 40.0000 mg | ORAL_TABLET | Freq: Every day | ORAL | Status: DC

## 2013-08-24 MED ORDER — METOPROLOL SUCCINATE ER 50 MG PO TB24: ORAL_TABLET | ORAL | Status: DC

## 2013-08-24 NOTE — Patient Instructions (Addendum)
Consider getting the shingles vaccine.

## 2013-08-24 NOTE — Progress Notes (Signed)
Subjective:    Patient ID: Emma Craig, female    DOB: 1951/05/09, 62 y.o.   MRN: [id number]  HPI DM -  no hypoglycemic events. No wounds that are not healing well. Taking medications as directed. Lab Results  Component Value Date   HGBA1C 6.3 05/25/2013   HTN -  Pt denies chest pain, SOB, dizziness, or heart palpitations.  Taking meds as directed w/o problems.  Denies medication side effects.    Hyperlipidemia-on atorvastin w/o S.E. Or myalgias.  Lab Results  Component Value Date   CHOL 169 01/26/2013   HDL 53 01/26/2013   LDLCALC 79 01/26/2013   LDLDIRECT 100* 04/10/2008   TRIG 183* 01/26/2013   CHOLHDL 3.2 01/26/2013      Review of Systems  BP 143/88  Pulse 99  Wt 192 lb (87.091 kg)  BMI 35.56 kg/m2    Allergies  Allergen Reactions  . Synthroid [Levothyroxine] Other (See Comments)    Tongue swelling b/c of filler.    Past Medical History  Diagnosis Date  . Obesity   . Diabetes mellitus   . Hyperlipidemia   . Hypertension   . Heavy menses   . Chronic kidney disease, stage 2, mildly decreased GFR     Past Surgical History  Procedure Laterality Date  . Rt elbow screws placed  10-82  . Cholecystectomy      laproscopic  . Cataract extraction  1-09    right   . Cataract extraction  09/2011    left     History   Social History  . Marital Status: Married    Spouse Name: N/A    Number of Children: N/A  . Years of Education: N/A   Occupational History  . Not on file.   Social History Main Topics  . Smoking status: Never Smoker   . Smokeless tobacco: Not on file  . Alcohol Use: Yes     Comment: rare  . Drug Use:   . 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   Other Topics Concern  . Not on file   Social History Narrative   She cloggs for fun.      Family History  Problem Relation Age of Onset  . Cancer Father 15    colon and prostate CA   . Cancer Mother 55    carcinoid tumor/with mets to brain  . Hypertension Sister   . Hyperlipidemia Sister   . Hypertension Brother   . Hypertension Brother   . Hypertension Sister   . Hyperlipidemia Sister   . Hypertension Sister   . Hyperlipidemia Sister   . Hypertension Sister   . Hyperlipidemia Sister   . Lymphoma Sister     non-hodgkins     Outpatient Encounter Prescriptions as of 08/24/2013  Medication Sig Dispense Refill  . amLODipine-atorvastatin (CADUET) 10-80 MG per tablet TAKE 1 TABLET BY MOUTH ONCE DAILY  30 tablet  5  . aspirin 81 MG EC tablet Take 81 mg by mouth daily.        . Cholecalciferol (VITAMIN D-3) 1000 UNITS CAPS Take 1 tablet by mouth daily.      . fish oil-omega-3 fatty acids 1000 MG capsule Take 1 g by mouth daily.        Marland Kitchen levothyroxine (SYNTHROID, LEVOTHROID) 75 MCG tablet take 1 tablet by mouth once daily  30 tablet  2  .  metoprolol succinate (TOPROL-XL) 100 MG 24 hr tablet take 1 tablet by mouth once daily IMMEDIATELY FOLLOWING A MEAL  90 tablet  1  . olmesartan (BENICAR) 40 MG tablet Take 1 tablet (40 mg total) by mouth daily.  30 tablet  6  . [DISCONTINUED] metoprolol succinate (TOPROL-XL) 50 MG 24 hr tablet take 1 tablet by mouth once daily IMMEDIATELY FOLLOWING A MEAL  30 tablet  2  . [DISCONTINUED] metoprolol succinate (TOPROL-XL) 50 MG 24 hr tablet take 1 tablet by mouth once daily IMMEDIATELY FOLLOWING A MEAL  30 tablet  2  . [DISCONTINUED] olmesartan (BENICAR) 40 MG tablet Take 1 tablet (40 mg total) by mouth daily.  30 tablet  6   No facility-administered encounter medications on file as of 08/24/2013.          Objective:   Physical Exam  Constitutional: She is oriented to person, place, and time. She appears well-developed and well-nourished.  HENT:  Head: Normocephalic and atraumatic.  Cardiovascular: Normal rate, regular rhythm and normal heart sounds.   Pulmonary/Chest: Effort normal and breath sounds normal.  Neurological: She is  alert and oriented to person, place, and time.  Skin: Skin is warm and dry.  Psychiatric: She has a normal mood and affect. Her behavior is normal.          Assessment & Plan:  DM -  On ARB, ASA, statin. A1c looks fantastic today at 6.3. Continue current regimen. Followup in 3-4 months. Continue work on diet and exercise.  HTN - uncontrolled. We'll increase metoprolol 200 mg. Continue work on diet and exercise. Followup in 3-4 months.  Hyperlipidemia-well-controlled this spring. Continue statin.

## 2013-10-22 ENCOUNTER — Other Ambulatory Visit: Payer: Self-pay | Admitting: Family Medicine

## 2013-10-30 ENCOUNTER — Other Ambulatory Visit: Payer: Self-pay | Admitting: Family Medicine

## 2013-12-14 ENCOUNTER — Encounter: Payer: Self-pay | Admitting: Family Medicine

## 2013-12-14 ENCOUNTER — Ambulatory Visit (INDEPENDENT_AMBULATORY_CARE_PROVIDER_SITE_OTHER): Admitting: Family Medicine

## 2013-12-14 VITALS — BP 142/88 | HR 88 | Temp 97.6°F | Ht 61.6 in | Wt 195.0 lb

## 2013-12-14 DIAGNOSIS — E119 Type 2 diabetes mellitus without complications: Secondary | ICD-10-CM

## 2013-12-14 DIAGNOSIS — E039 Hypothyroidism, unspecified: Secondary | ICD-10-CM

## 2013-12-14 DIAGNOSIS — I10 Essential (primary) hypertension: Secondary | ICD-10-CM

## 2013-12-14 LAB — POCT GLYCOSYLATED HEMOGLOBIN (HGB A1C): Hemoglobin A1C: 6.2

## 2013-12-14 MED ORDER — METOPROLOL SUCCINATE ER 100 MG PO TB24: ORAL_TABLET | ORAL | Status: DC

## 2013-12-14 NOTE — Progress Notes (Signed)
   Subjective:    Patient ID: Emma Craig, female    DOB: 04-01-1951, 63 y.o.   MRN: [id number]  HPI Diabetes - no hypoglycemic events. No wounds or sores that are not healing well. No increased thirst or urination. Checking glucose at home. Taking medications as prescribed without any side effects.  Hypertension- Pt denies chest pain, SOB, dizziness, or heart palpitations.  Taking meds as directed w/o problems.  Denies medication side effects.    Hypothyroid - No skin or hair changes except for dryskin. She has gained weight.  Lab Results  Component Value Date   TSH 1.011 01/26/2013    Review of Systems     Objective:   Physical Exam  Constitutional: She is oriented to person, place, and time. She appears well-developed and well-nourished.  HENT:  Head: Normocephalic and atraumatic.  Cardiovascular: Normal rate, regular rhythm and normal heart sounds.   Pulmonary/Chest: Effort normal and breath sounds normal.  Neurological: She is alert and oriented to person, place, and time.  Skin: Skin is warm and dry.  Psychiatric: She has a normal mood and affect. Her behavior is normal.          Assessment & Plan:  Diabetes - Well congrolled. A1C is 6.2.  Eye exam is up to date.  She is on an arm. Not currently on a statin. A technically her A1c is under 6.5 off of medications.  HTN- uncontrolled.  Repeat blood pressure was improved but still not quite at goal. I'm going to have her try eat RV 80 mg in place of her Benicar for 4 weeks and come back in about 3 weeks to recheck her blood pressure to see if it actually looks better controlled on ARB versus Benicar. She doesn't tolerate diuretics she says it gives her dry mouth and cough some hesitant to add this to her regimen. She is Re: tried it before.  Hypothyroid - due to recheck TSH. Given lab slip today. She says she will try to get in the next couple of weeks. She has gained some weight but she feels that this is mostly due to diet and  lack of exercise. She does plan on starting an exercise regimen and really working on her weight.   Colonoscopy is scheduled for 01/28/14.

## 2013-12-14 NOTE — Patient Instructions (Signed)
Try the Edarbi instead of your Benicar for one month.  Come in to recheck BP in 3 weeks.

## 2014-01-02 ENCOUNTER — Encounter

## 2014-01-04 ENCOUNTER — Encounter: Payer: Self-pay | Admitting: Family Medicine

## 2014-01-04 ENCOUNTER — Ambulatory Visit (INDEPENDENT_AMBULATORY_CARE_PROVIDER_SITE_OTHER): Admitting: Family Medicine

## 2014-01-04 VITALS — BP 143/79 | HR 83 | Wt 194.0 lb

## 2014-01-04 DIAGNOSIS — I10 Essential (primary) hypertension: Secondary | ICD-10-CM

## 2014-01-04 NOTE — Progress Notes (Signed)
Kenney Houseman, Will you please let Mrs. Dewitt know that her BP is still not at goal however it has improved.  I'd encourage her to f/u with Dr. Jerilynn Mages to continue discussing hypertension medication options.  If she needs Edarbi samples to last her until this f/u she's welcome to have some of our samples.

## 2014-01-04 NOTE — Progress Notes (Signed)
Pt here for BP check. She was given samples of Edarbi to try for 3 wks and she has been on this for 2 wks. Asked her if she has limited/watched her sodium intake she does not recall any extra intake. Pt denies any HA,SOB or CP.Emma Craig [place]

## 2014-01-07 NOTE — Progress Notes (Signed)
Pt informed.Emma Craig Emma Craig  

## 2014-01-22 ENCOUNTER — Other Ambulatory Visit: Payer: Self-pay | Admitting: Family Medicine

## 2014-01-28 LAB — COMPLETE METABOLIC PANEL WITH GFR
ALBUMIN: 4.4 g/dL (ref 3.5–5.2)
ALK PHOS: 93 U/L (ref 39–117)
ALT: 29 U/L (ref 0–35)
AST: 20 U/L (ref 0–37)
BUN: 13 mg/dL (ref 6–23)
CO2: 30 mEq/L (ref 19–32)
Calcium: 9.4 mg/dL (ref 8.4–10.5)
Chloride: 101 mEq/L (ref 96–112)
Creat: 0.91 mg/dL (ref 0.50–1.10)
GFR, Est African American: 79 mL/min
GFR, Est Non African American: 69 mL/min
Glucose, Bld: 104 mg/dL — ABNORMAL HIGH (ref 70–99)
Potassium: 5.3 mEq/L (ref 3.5–5.3)
SODIUM: 140 meq/L (ref 135–145)
TOTAL PROTEIN: 6.9 g/dL (ref 6.0–8.3)
Total Bilirubin: 0.3 mg/dL (ref 0.2–1.2)

## 2014-01-28 LAB — LIPID PANEL
CHOL/HDL RATIO: 3.4 ratio
Cholesterol: 182 mg/dL (ref 0–200)
HDL: 54 mg/dL (ref >39)
LDL CALC: 87 mg/dL (ref 0–99)
TRIGLYCERIDES: 203 mg/dL — AB (ref <150)
VLDL: 41 mg/dL — AB (ref 0–40)

## 2014-01-28 LAB — TSH: TSH: 1.35 u[IU]/mL (ref 0.350–4.500)

## 2014-03-12 ENCOUNTER — Ambulatory Visit (INDEPENDENT_AMBULATORY_CARE_PROVIDER_SITE_OTHER): Admitting: Family Medicine

## 2014-03-12 ENCOUNTER — Encounter: Payer: Self-pay | Admitting: Family Medicine

## 2014-03-12 VITALS — BP 146/78 | HR 93 | Wt 198.0 lb

## 2014-03-12 DIAGNOSIS — E039 Hypothyroidism, unspecified: Secondary | ICD-10-CM

## 2014-03-12 DIAGNOSIS — I10 Essential (primary) hypertension: Secondary | ICD-10-CM

## 2014-03-12 DIAGNOSIS — E119 Type 2 diabetes mellitus without complications: Secondary | ICD-10-CM

## 2014-03-12 LAB — POCT GLYCOSYLATED HEMOGLOBIN (HGB A1C): HEMOGLOBIN A1C: 6.2

## 2014-03-12 MED ORDER — LEVOTHYROXINE SODIUM 75 MCG PO TABS: ORAL_TABLET | ORAL | Status: DC

## 2014-03-12 MED ORDER — AMLODIPINE-ATORVASTATIN 10-80 MG PO TABS: ORAL_TABLET | ORAL | Status: DC

## 2014-03-12 MED ORDER — METOPROLOL SUCCINATE ER 100 MG PO TB24: ORAL_TABLET | ORAL | Status: DC

## 2014-03-12 MED ORDER — OLMESARTAN MEDOXOMIL 40 MG PO TABS: 40.0000 mg | ORAL_TABLET | Freq: Every day | ORAL | Status: DC

## 2014-03-12 NOTE — Progress Notes (Signed)
   Subjective:    Patient ID: Emma Craig, female    DOB: [birth year], 63 y.o.   MRN: [id number]  HPI Diabetes - no hypoglycemic events. No wounds or sores that are not healing well. No increased thirst or urination. Checking glucose at home. Taking medications as prescribed without any side effects. Lab Results  Component Value Date   HGBA1C 6.2 12/14/2013    Hypertension- Pt denies chest pain, SOB, dizziness, or heart palpitations.  Taking meds as directed w/o problems.  Denies medication side effects.    Hypothyroidism-doing well on current regimen. Her current thyroid medication does give her a little bit of a dry mouth but she would like to continue this. She did not do well on previous medication.   Review of Systems     Objective:   Physical Exam  Constitutional: She is oriented to person, place, and time. She appears well-developed and well-nourished.  HENT:  Head: Normocephalic and atraumatic.  Cardiovascular: Normal rate, regular rhythm and normal heart sounds.   Pulmonary/Chest: Effort normal and breath sounds normal.  Neurological: She is alert and oriented to person, place, and time.  Skin: Skin is warm and dry.  Psychiatric: She has a normal mood and affect. Her behavior is normal.          Assessment & Plan:  DM -   Well controlled on current regimen. F/U in 4 months. Not a statin. Discussed new guildine. She will think about it.   On ARB and statin.   HTN- well controlled. Continue current regimen. Followup in 4 months.  Hypothyroidism-TSH is well-controlled on current regimen. We'll send refills today. She requested to have prescriptions printed for mail order.  Shingles vaccine handout provided. Encouraged to call her insurance company to check on cost.  Screening colonoscopy scheduled for May.

## 2014-04-15 ENCOUNTER — Encounter: Payer: Self-pay | Admitting: Physician Assistant

## 2014-04-15 ENCOUNTER — Ambulatory Visit

## 2014-04-15 ENCOUNTER — Ambulatory Visit (INDEPENDENT_AMBULATORY_CARE_PROVIDER_SITE_OTHER): Admitting: Physician Assistant

## 2014-04-15 VITALS — BP 142/66 | HR 96 | Ht 61.6 in | Wt 188.0 lb

## 2014-04-15 DIAGNOSIS — M7989 Other specified soft tissue disorders: Secondary | ICD-10-CM

## 2014-04-15 DIAGNOSIS — M79604 Pain in right leg: Secondary | ICD-10-CM

## 2014-04-15 DIAGNOSIS — L0291 Cutaneous abscess, unspecified: Secondary | ICD-10-CM

## 2014-04-15 DIAGNOSIS — M79609 Pain in unspecified limb: Secondary | ICD-10-CM

## 2014-04-15 DIAGNOSIS — L0390 Cellulitis, unspecified: Secondary | ICD-10-CM

## 2014-04-15 MED ORDER — CEPHALEXIN 500 MG PO CAPS
500.0000 mg | ORAL_CAPSULE | Freq: Two times a day (BID) | ORAL | Status: DC
Start: 2014-04-15 — End: 2014-04-26

## 2014-04-15 NOTE — Progress Notes (Signed)
   Subjective:    Patient ID: Emma Craig, female    DOB: [birth year], 63 y.o.   MRN: [id number]  HPI Pt is a 63 yo female who presents to the clinic with right leg pain, redness and swelling for 2 weeks. She  is diabetic but controlled. She scrapped her leg on a baby gate 2 weeks ago. It continues to be painful and warm to the touch. She has done nothing to make better. The scrap seems to be better but pain and swelling worse. Her anterior right leg is painful and notice swelling with a knot above the scratch. Denies any fever, fatigue, SOB, wheezing.    Review of Systems     Objective:   Physical Exam  Constitutional: She appears well-developed and well-nourished.  HENT:  Head: Normocephalic and atraumatic.  Cardiovascular: Normal rate, regular rhythm and normal heart sounds.   Pulmonary/Chest: Effort normal and breath sounds normal. She has no wheezes.  Musculoskeletal:  Negative homans sign.   Skin:             Assessment & Plan:  Right leg pain/right leg swelling/cellulitis- discuss with patient likelihood of blood clot and very low however since there is a visible nodule and swelling with some tenderness will have ultrasound scan done today. Stat ultrasound was done on patient and negative for DVT. There was some ongoing erythema and warmness to touch. Patient was treated for cellulitis with Keflex for 10 days. Discuss with patient symptomatic things to do for pain and swelling. Elevation was among top priority. If not improving or if worsening please call office.

## 2014-04-15 NOTE — Patient Instructions (Signed)
Cellulitis Cellulitis is an infection of the skin and the tissue beneath it. The infected area is usually red and tender. Cellulitis occurs most often in the arms and lower legs.  CAUSES  Cellulitis is caused by bacteria that enter the skin through cracks or cuts in the skin. The most common types of bacteria that cause cellulitis are Staphylococcus and Streptococcus. SYMPTOMS   Redness and warmth.  Swelling.  Tenderness or pain.  Fever. DIAGNOSIS  Your caregiver can usually determine what is wrong based on a physical exam. Blood tests may also be done. TREATMENT  Treatment usually involves taking an antibiotic medicine. HOME CARE INSTRUCTIONS   Take your antibiotics as directed. Finish them even if you start to feel better.  Keep the infected arm or leg elevated to reduce swelling.  Apply a warm cloth to the affected area up to 4 times per day to relieve pain.  Only take over-the-counter or prescription medicines for pain, discomfort, or fever as directed by your caregiver.  Keep all follow-up appointments as directed by your caregiver. SEEK MEDICAL CARE IF:   You notice red streaks coming from the infected area.  Your red area gets larger or turns dark in color.  Your bone or joint underneath the infected area becomes painful after the skin has healed.  Your infection returns in the same area or another area.  You notice a swollen bump in the infected area.  You develop new symptoms. SEEK IMMEDIATE MEDICAL CARE IF:   You have a fever.  You feel very sleepy.  You develop vomiting or diarrhea.  You have a general ill feeling (malaise) with muscle aches and pains. MAKE SURE YOU:   Understand these instructions.  Will watch your condition.  Will get help right away if you are not doing well or get worse. Document Released: 08/25/2005 Document Revised: 05/16/2012 Document Reviewed: 01/31/2012 [employer] Patient Information 2014 [employer].  

## 2014-04-26 ENCOUNTER — Ambulatory Visit (INDEPENDENT_AMBULATORY_CARE_PROVIDER_SITE_OTHER)

## 2014-04-26 ENCOUNTER — Encounter: Payer: Self-pay | Admitting: Physician Assistant

## 2014-04-26 ENCOUNTER — Ambulatory Visit (INDEPENDENT_AMBULATORY_CARE_PROVIDER_SITE_OTHER): Admitting: Physician Assistant

## 2014-04-26 VITALS — BP 124/62 | HR 85 | Ht 61.6 in | Wt 185.0 lb

## 2014-04-26 DIAGNOSIS — L0390 Cellulitis, unspecified: Secondary | ICD-10-CM

## 2014-04-26 DIAGNOSIS — L0291 Cutaneous abscess, unspecified: Secondary | ICD-10-CM

## 2014-04-26 DIAGNOSIS — M79604 Pain in right leg: Secondary | ICD-10-CM

## 2014-04-26 DIAGNOSIS — M79609 Pain in unspecified limb: Secondary | ICD-10-CM

## 2014-04-26 MED ORDER — SULFAMETHOXAZOLE-TMP DS 800-160 MG PO TABS: 1.0000 | ORAL_TABLET | Freq: Two times a day (BID) | ORAL | Status: DC

## 2014-04-26 NOTE — Patient Instructions (Signed)
Cellulitis Cellulitis is an infection of the skin and the tissue beneath it. The infected area is usually red and tender. Cellulitis occurs most often in the arms and lower legs.  CAUSES  Cellulitis is caused by bacteria that enter the skin through cracks or cuts in the skin. The most common types of bacteria that cause cellulitis are Staphylococcus and Streptococcus. SYMPTOMS   Redness and warmth.  Swelling.  Tenderness or pain.  Fever. DIAGNOSIS  Your caregiver can usually determine what is wrong based on a physical exam. Blood tests may also be done. TREATMENT  Treatment usually involves taking an antibiotic medicine. HOME CARE INSTRUCTIONS   Take your antibiotics as directed. Finish them even if you start to feel better.  Keep the infected arm or leg elevated to reduce swelling.  Apply a warm cloth to the affected area up to 4 times per day to relieve pain.  Only take over-the-counter or prescription medicines for pain, discomfort, or fever as directed by your caregiver.  Keep all follow-up appointments as directed by your caregiver. SEEK MEDICAL CARE IF:   You notice red streaks coming from the infected area.  Your red area gets larger or turns dark in color.  Your bone or joint underneath the infected area becomes painful after the skin has healed.  Your infection returns in the same area or another area.  You notice a swollen bump in the infected area.  You develop new symptoms. SEEK IMMEDIATE MEDICAL CARE IF:   You have a fever.  You feel very sleepy.  You develop vomiting or diarrhea.  You have a general ill feeling (malaise) with muscle aches and pains. MAKE SURE YOU:   Understand these instructions.  Will watch your condition.  Will get help right away if you are not doing well or get worse. Document Released: 08/25/2005 Document Revised: 05/16/2012 Document Reviewed: 01/31/2012 [employer] Patient Information 2014 [employer].  

## 2014-04-26 NOTE — Progress Notes (Signed)
   Subjective:    Patient ID: Emma Craig, female    DOB: 1951-10-05, 63 y.o.   MRN: [id number]  HPI Pt is following up after being seen 11 days ago for cellulitis of right leg after tripping on baby gate. U/s were done to rule out DVT and pt given keflex. She has not improving and seems to be getting a little worse. Area is very tender to touch and becoming more difficult to walk on. Elevation of leg seems to help the most.     Review of Systems     Objective:   Physical Exam  Musculoskeletal:       Legs:         Assessment & Plan:  Cellulitis- pt seems unresponsive to keflex. Started bactrim for 10 days. If not improving by Monday please call office. Continue to elevate and warm compresses. HO given. Xray of tibia and fibula were gotten today to rule out any bone cause of symptoms. They were negative.

## 2014-04-29 ENCOUNTER — Telehealth: Payer: Self-pay | Admitting: *Deleted

## 2014-04-29 NOTE — Telephone Encounter (Signed)
Pt notified of instructions

## 2014-04-29 NOTE — Telephone Encounter (Signed)
Discussed case with Dr. Madilyn Fireman. Continue bactrim. Use ace wrap to apply compression up right leg. Take ibuprofen 800mg  twice a day for inflammation. Follow up with Dr. Madilyn Fireman if not improving on Wednesday.

## 2014-04-29 NOTE — Telephone Encounter (Signed)
Pt called this morning to let you know that the meds you gave her Friday for her cellulitis aren't working.  Her leg is still red & inflamed & sore.

## 2014-05-01 ENCOUNTER — Telehealth: Payer: Self-pay

## 2014-05-01 NOTE — Telephone Encounter (Signed)
Emma Craig reports only a slight improvement with the area of cellulitis. She has been taking medication as directed and applying heat. Denies fever, chills or sweats. Please advise.

## 2014-05-01 NOTE — Telephone Encounter (Signed)
Continue to compress with ace wrap. Make appt if not continuing to improve with Dr. Madilyn Fireman on Friday.

## 2014-05-02 NOTE — Telephone Encounter (Signed)
LMOM with response.  Misty Ahmad, LPN  

## 2014-05-03 ENCOUNTER — Ambulatory Visit: Admitting: Physician Assistant

## 2014-07-19 ENCOUNTER — Ambulatory Visit (INDEPENDENT_AMBULATORY_CARE_PROVIDER_SITE_OTHER): Admitting: Family Medicine

## 2014-07-19 ENCOUNTER — Encounter: Payer: Self-pay | Admitting: Family Medicine

## 2014-07-19 VITALS — BP 142/74 | HR 73 | Ht 61.6 in | Wt 184.0 lb

## 2014-07-19 DIAGNOSIS — I10 Essential (primary) hypertension: Secondary | ICD-10-CM

## 2014-07-19 DIAGNOSIS — E119 Type 2 diabetes mellitus without complications: Secondary | ICD-10-CM

## 2014-07-19 DIAGNOSIS — M76899 Other specified enthesopathies of unspecified lower limb, excluding foot: Secondary | ICD-10-CM

## 2014-07-19 DIAGNOSIS — M7061 Trochanteric bursitis, right hip: Secondary | ICD-10-CM

## 2014-07-19 LAB — POCT GLYCOSYLATED HEMOGLOBIN (HGB A1C): Hemoglobin A1C: 6.3

## 2014-07-19 LAB — BASIC METABOLIC PANEL WITH GFR
BUN: 17 mg/dL (ref 6–23)
CO2: 29 mEq/L (ref 19–32)
Calcium: 9.4 mg/dL (ref 8.4–10.5)
Chloride: 101 mEq/L (ref 96–112)
Creat: 0.86 mg/dL (ref 0.50–1.10)
GFR, EST AFRICAN AMERICAN: 84 mL/min
GFR, Est Non African American: 73 mL/min
GLUCOSE: 117 mg/dL — AB (ref 70–99)
POTASSIUM: 4.9 meq/L (ref 3.5–5.3)
Sodium: 141 mEq/L (ref 135–145)

## 2014-07-19 LAB — POCT UA - MICROALBUMIN
Albumin/Creatinine Ratio, Urine, POC: <30
Creatinine, POC: 100 mg/dL
MICROALBUMIN (UR) POC: 10 mg/L

## 2014-07-19 NOTE — Assessment & Plan Note (Signed)
Well-controlled.  Continue current regimen. 

## 2014-07-19 NOTE — Progress Notes (Signed)
   Subjective:    Patient ID: Emma Craig, female    DOB: [birth year], 63 y.o.   MRN: [id number]  Diabetes   Diabetes - no hypoglycemic events. No wounds or sores that are not healing well. No increased thirst or urination. Checking glucose at home. Taking medications as prescribed without any side effects.  Hypertension- Pt denies chest pain, SOB, dizziness, or heart palpitations.  Taking meds as directed w/o problems.  Denies medication side effects.    Right hip pain x 3 weeks. No trauma or injury. Constant. Worse with clogging.  Painful to sleep on the side.  Tried some tylenol once.  Review of Systems     Objective:   Physical Exam  Constitutional: She is oriented to person, place, and time. She appears well-developed and well-nourished.  HENT:  Head: Normocephalic and atraumatic.  Cardiovascular: Normal rate, regular rhythm and normal heart sounds.   Pulmonary/Chest: Effort normal and breath sounds normal.  Neurological: She is alert and oriented to person, place, and time.  Skin: Skin is warm and dry.  Psychiatric: She has a normal mood and affect. Her behavior is normal.          Assessment & Plan:  Declines flu vaccine today  Declines mammo today.    Right trochanteric bursitis-discussed treatment options. We'll start with conservative therapy including exercises. Handout provided. Recommend anti-inflammatory for 5-7 days. Stop immediately if any GI upset her stomach irritation. This should help with inflammation and pain. Avoid sleeping on that side. If not improving over the next 3-4 weeks then please let me know and we can consider injection for therapy.

## 2014-07-19 NOTE — Patient Instructions (Addendum)
We'll start with conservative therapy including exercises. Handout provided. Recommend anti-inflammatory for 5-7 days. Stop immediately if any GI upset her stomach irritation. This should help with inflammation and pain. Avoid sleeping on that side. If not improving over the next 3-4 weeks then please let me know and we can consider injection for therapy. Please think about getting  Your mammogram

## 2014-07-19 NOTE — Assessment & Plan Note (Signed)
Well controlled.  Urine micro done today.  On statin, or ARB.  F/U in 43mo  Lab Results  Component Value Date   HGBA1C 6.3 07/19/2014

## 2014-07-21 NOTE — Progress Notes (Signed)
Quick Note:  All labs are normal. ______ 

## 2014-08-20 ENCOUNTER — Encounter: Payer: Self-pay | Admitting: Family Medicine

## 2014-10-09 ENCOUNTER — Encounter: Payer: Self-pay | Admitting: Family Medicine

## 2014-10-09 ENCOUNTER — Ambulatory Visit (INDEPENDENT_AMBULATORY_CARE_PROVIDER_SITE_OTHER): Admitting: Family Medicine

## 2014-10-09 VITALS — BP 133/71 | HR 90 | Wt 196.0 lb

## 2014-10-09 DIAGNOSIS — Z1231 Encounter for screening mammogram for malignant neoplasm of breast: Secondary | ICD-10-CM

## 2014-10-09 DIAGNOSIS — E119 Type 2 diabetes mellitus without complications: Secondary | ICD-10-CM

## 2014-10-09 DIAGNOSIS — I10 Essential (primary) hypertension: Secondary | ICD-10-CM

## 2014-10-09 LAB — BASIC METABOLIC PANEL WITH GFR
BUN: 16 mg/dL (ref 6–23)
CO2: 27 mEq/L (ref 19–32)
CREATININE: 0.9 mg/dL (ref 0.50–1.10)
Calcium: 10.1 mg/dL (ref 8.4–10.5)
Chloride: 97 mEq/L (ref 96–112)
GFR, EST AFRICAN AMERICAN: 80 mL/min
GFR, EST NON AFRICAN AMERICAN: 69 mL/min
GLUCOSE: 103 mg/dL — AB (ref 70–99)
POTASSIUM: 4.6 meq/L (ref 3.5–5.3)
Sodium: 138 mEq/L (ref 135–145)

## 2014-10-09 LAB — POCT GLYCOSYLATED HEMOGLOBIN (HGB A1C): Hemoglobin A1C: 6.3

## 2014-10-09 NOTE — Progress Notes (Signed)
   Subjective:    Patient ID: Emma Craig, female    DOB: 1951-05-12, 63 y.o.   MRN: [id number]  HPI Diabetes - no hypoglycemic events. No wounds or sores that are not healing well. No increased thirst or urination. Not hecking glucose at home. Taking medications as prescribed without any side effects.  HgBA1c today, Eye exam 12/14, MicroAlbumin 07/19/14, Foot exam 03/12/2014   Hypertension- Pt denies chest pain, SOB, dizziness, or heart palpitations.  Taking meds as directed w/o problems.  Denies medication side effects.      Review of Systems     Objective:   Physical Exam  Constitutional: She is oriented to person, place, and time. She appears well-developed and well-nourished.  HENT:  Head: Normocephalic and atraumatic.  Cardiovascular: Normal rate, regular rhythm and normal heart sounds.   Pulmonary/Chest: Effort normal and breath sounds normal.  Neurological: She is alert and oriented to person, place, and time.  Skin: Skin is warm and dry.  Psychiatric: She has a normal mood and affect. Her behavior is normal.          Assessment & Plan:  DM - well controlled. Work on diet and exercise.  ON ARB.  Not on a statin.  Deu for labs. F/U in 3 months.  Declined  Starting a statin.    HTN - Well controlled.  F/U  In 6 months.    Discuss need for mammogram. Order place today. She's been very hesitant to go. Her last almost 5 years ago.

## 2014-10-10 NOTE — Progress Notes (Signed)
Quick Note:  All labs are normal. ______ 

## 2014-10-11 ENCOUNTER — Ambulatory Visit: Admitting: Family Medicine

## 2014-10-30 ENCOUNTER — Ambulatory Visit (INDEPENDENT_AMBULATORY_CARE_PROVIDER_SITE_OTHER)

## 2014-10-30 DIAGNOSIS — Z1231 Encounter for screening mammogram for malignant neoplasm of breast: Secondary | ICD-10-CM

## 2015-01-10 ENCOUNTER — Encounter: Payer: Self-pay | Admitting: Family Medicine

## 2015-01-10 ENCOUNTER — Ambulatory Visit (INDEPENDENT_AMBULATORY_CARE_PROVIDER_SITE_OTHER): Admitting: Family Medicine

## 2015-01-10 VITALS — BP 140/82 | HR 95 | Ht 61.5 in | Wt 206.0 lb

## 2015-01-10 DIAGNOSIS — E038 Other specified hypothyroidism: Secondary | ICD-10-CM

## 2015-01-10 DIAGNOSIS — Z23 Encounter for immunization: Secondary | ICD-10-CM

## 2015-01-10 DIAGNOSIS — I10 Essential (primary) hypertension: Secondary | ICD-10-CM

## 2015-01-10 DIAGNOSIS — Z6838 Body mass index (BMI) 38.0-38.9, adult: Secondary | ICD-10-CM

## 2015-01-10 DIAGNOSIS — E119 Type 2 diabetes mellitus without complications: Secondary | ICD-10-CM

## 2015-01-10 LAB — POCT GLYCOSYLATED HEMOGLOBIN (HGB A1C): HEMOGLOBIN A1C: 6.4

## 2015-01-10 LAB — COMPLETE METABOLIC PANEL WITH GFR
ALBUMIN: 4.3 g/dL (ref 3.5–5.2)
ALK PHOS: 86 U/L (ref 39–117)
ALT: 28 U/L (ref 0–35)
AST: 24 U/L (ref 0–37)
BILIRUBIN TOTAL: 0.3 mg/dL (ref 0.2–1.2)
BUN: 17 mg/dL (ref 6–23)
CO2: 27 mEq/L (ref 19–32)
Calcium: 9.2 mg/dL (ref 8.4–10.5)
Chloride: 102 mEq/L (ref 96–112)
Creat: 0.8 mg/dL (ref 0.50–1.10)
GFR, EST NON AFRICAN AMERICAN: 80 mL/min
GFR, Est African American: >89 mL/min
GLUCOSE: 111 mg/dL — AB (ref 70–99)
POTASSIUM: 4.1 meq/L (ref 3.5–5.3)
Sodium: 139 mEq/L (ref 135–145)
TOTAL PROTEIN: 6.9 g/dL (ref 6.0–8.3)

## 2015-01-10 LAB — LIPID PANEL
Cholesterol: 168 mg/dL (ref 0–200)
HDL: 51 mg/dL (ref >39)
LDL Cholesterol: 82 mg/dL (ref 0–99)
Total CHOL/HDL Ratio: 3.3 Ratio
Triglycerides: 175 mg/dL — ABNORMAL HIGH (ref <150)
VLDL: 35 mg/dL (ref 0–40)

## 2015-01-10 LAB — TSH: TSH: 0.86 u[IU]/mL (ref 0.350–4.500)

## 2015-01-10 MED ORDER — ZOSTER VACCINE LIVE 19400 UNT/0.65ML ~~LOC~~ SOLR: 0.6500 mL | Freq: Once | SUBCUTANEOUS | Status: DC

## 2015-01-10 MED ORDER — OLMESARTAN MEDOXOMIL 40 MG PO TABS: 40.0000 mg | ORAL_TABLET | Freq: Every day | ORAL | Status: DC

## 2015-01-10 MED ORDER — AMLODIPINE-ATORVASTATIN 10-80 MG PO TABS: ORAL_TABLET | ORAL | Status: DC

## 2015-01-10 NOTE — Addendum Note (Signed)
Addended by: Teddy Spike on: 01/10/2015 01:03 PM   Modules accepted: Orders

## 2015-01-10 NOTE — Progress Notes (Signed)
   Subjective:    Patient ID: Emma Craig, female    DOB: March 18, 1951, 64 y.o.   MRN: [id number]  HPI Diabetes - no hypoglycemic events. No wounds or sores that are not healing well. No increased thirst or urination. Checking glucose at home. Taking medications as prescribed without any side effects.  Says eye exam is UTD.  She has started weight watchers.   Hypertension- Pt denies chest pain, SOB, dizziness, or heart palpitations.  Taking meds as directed w/o problems.  Denies medication side effects.    Hypothyroid- no skin or hair changes. Has gained 10 lbs. No fatigue.   Review of Systems     Objective:   Physical Exam  Constitutional: She is oriented to person, place, and time. She appears well-developed and well-nourished.  HENT:  Head: Normocephalic and atraumatic.  Cardiovascular: Normal rate, regular rhythm and normal heart sounds.   Pulmonary/Chest: Effort normal and breath sounds normal.  Neurological: She is alert and oriented to person, place, and time.  Skin: Skin is warm and dry.  Psychiatric: She has a normal mood and affect. Her behavior is normal.          Assessment & Plan:  DM- Well controled. A1C is 6.4 today, up from previous of 6.3%.  Will call ot get eye report. She evidently was treated for iritis. She i son drops righ tnow.   HTN - Elevated today but she has gained 10 lbs since last here but she is working on her weight with Weight [employer].   Hypothyroid - reck TSH.  BMI 38- work on diet and exercise.   Due for pap smear. Decllined flu shot. Ok with shingles vaccine.

## 2015-01-27 ENCOUNTER — Other Ambulatory Visit: Payer: Self-pay | Admitting: Family Medicine

## 2015-01-27 ENCOUNTER — Ambulatory Visit: Admitting: Family Medicine

## 2015-04-02 ENCOUNTER — Other Ambulatory Visit: Payer: Self-pay | Admitting: Family Medicine

## 2015-04-14 ENCOUNTER — Ambulatory Visit (INDEPENDENT_AMBULATORY_CARE_PROVIDER_SITE_OTHER): Admitting: Family Medicine

## 2015-04-14 ENCOUNTER — Encounter: Payer: Self-pay | Admitting: Family Medicine

## 2015-04-14 VITALS — BP 140/82 | HR 80 | Wt 201.0 lb

## 2015-04-14 DIAGNOSIS — E119 Type 2 diabetes mellitus without complications: Secondary | ICD-10-CM

## 2015-04-14 DIAGNOSIS — I10 Essential (primary) hypertension: Secondary | ICD-10-CM

## 2015-04-14 LAB — POCT GLYCOSYLATED HEMOGLOBIN (HGB A1C): Hemoglobin A1C: 6.4

## 2015-04-14 NOTE — Progress Notes (Deleted)
   Subjective:    Patient ID: Emma Craig, female    DOB: August 27, 1951, 64 y.o.   MRN: [id number]  HPI Diabetes - no hypoglycemic events. No wounds or sores that are not healing well. No increased thirst or urination. Checking glucose at home. Taking medications as prescribed without any side effects. Last A1c was 6.4.  Hypertension- Pt denies chest pain, SOB, dizziness, or heart palpitations.  Taking meds as directed w/o problems.  Denies medication side effects.    Reviewed recent lab work with her. Cholesterol looks good except request rides are mildly elevated. Thyroid looks great as well.   Review of Systems     Objective:   Physical Exam  Constitutional: She is oriented to person, place, and time. She appears well-developed and well-nourished.  HENT:  Head: Normocephalic and atraumatic.  Cardiovascular: Normal rate, regular rhythm and normal heart sounds.   Pulmonary/Chest: Effort normal and breath sounds normal.  Neurological: She is alert and oriented to person, place, and time.  Skin: Skin is warm and dry.  Psychiatric: She has a normal mood and affect. Her behavior is normal.          Assessment & Plan:  Diabetes-  Hypertension-   Subjective:     Emma Craig is a 64 y.o. female and is here for a comprehensive physical exam. The patient reports {problems:16946}.  History   Social History  . Marital Status: Married    Spouse Name: N/A  . Number of Children: N/A  . Years of Education: N/A   Occupational History  . Not on file.   Social History Main Topics  . Smoking status: Never Smoker   . Smokeless tobacco: Not on file  . Alcohol Use: Yes     Comment: rare  . Drug Use: Not on file  . 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   Other Topics Concern  . Not on file   Social History Narrative   She cloggs for fun.     Health  Maintenance  Topic Date Due  . Hepatitis C Screening  [birth year]  . HIV Screening  07/16/1968  . PAP SMEAR  12/30/2014  . FOOT EXAM  03/13/2015  . INFLUENZA VACCINE  01/11/2016 (Originally 06/30/2015)  . HEMOGLOBIN A1C  07/11/2015  . URINE MICROALBUMIN  07/20/2015  . OPHTHALMOLOGY EXAM  12/11/2015  . MAMMOGRAM  10/30/2016  . PNEUMOCOCCAL POLYSACCHARIDE VACCINE (2) 12/30/2016  . TETANUS/TDAP  02/15/2019  . COLONOSCOPY  05/20/2021  . ZOSTAVAX  Completed    {Common ambulatory SmartLinks:19316}  Review of Systems {ros; complete:30496}   Objective:    {Exam, Complete:[phone]}    Assessment:    Healthy female exam. ***     Plan:     See After Visit Summary for Counseling Recommendations

## 2015-04-14 NOTE — Progress Notes (Signed)
   Subjective:    Patient ID: Emma Craig, female    DOB: [birth year], 64 y.o.   MRN: [id number]  HPI  Diabetes - no hypoglycemic events. No wounds or sores that are not healing well. No increased thirst or urination. Checking glucose at home. Taking medications as prescribed without any side effects.   Hypertension- Pt denies chest pain, SOB, dizziness, or heart palpitations.  Taking meds as directed w/o problems.  Denies medication side effects.      Review of Systems     Objective:   Physical Exam  Constitutional: She is oriented to person, place, and time. She appears well-developed and well-nourished.  HENT:  Head: Normocephalic and atraumatic.  Cardiovascular: Normal rate, regular rhythm and normal heart sounds.   Pulmonary/Chest: Effort normal and breath sounds normal.  Neurological: She is alert and oriented to person, place, and time.  Skin: Skin is warm and dry.  Psychiatric: She has a normal mood and affect. Her behavior is normal.          Assessment & Plan:  DM-  Well controlled.  F/U in 3 mo. On a statin.    HTN- Borderline controlled today. She i working on losing weight and has already lost 5 lbs.

## 2015-07-22 ENCOUNTER — Encounter: Payer: Self-pay | Admitting: Family Medicine

## 2015-07-22 ENCOUNTER — Ambulatory Visit (INDEPENDENT_AMBULATORY_CARE_PROVIDER_SITE_OTHER): Admitting: Family Medicine

## 2015-07-22 VITALS — BP 137/76 | HR 85 | Ht 61.5 in | Wt 205.0 lb

## 2015-07-22 DIAGNOSIS — Z114 Encounter for screening for human immunodeficiency virus [HIV]: Secondary | ICD-10-CM | POA: Diagnosis not present

## 2015-07-22 DIAGNOSIS — I10 Essential (primary) hypertension: Secondary | ICD-10-CM

## 2015-07-22 DIAGNOSIS — Z1159 Encounter for screening for other viral diseases: Secondary | ICD-10-CM

## 2015-07-22 DIAGNOSIS — E119 Type 2 diabetes mellitus without complications: Secondary | ICD-10-CM | POA: Diagnosis not present

## 2015-07-22 LAB — HEPATITIS C ANTIBODY: HCV AB: NEGATIVE

## 2015-07-22 LAB — BASIC METABOLIC PANEL WITH GFR
BUN: 15 mg/dL (ref 7–25)
CHLORIDE: 103 mmol/L (ref 98–110)
CO2: 27 mmol/L (ref 20–31)
Calcium: 9.3 mg/dL (ref 8.6–10.4)
Creat: 0.87 mg/dL (ref 0.50–0.99)
GFR, EST AFRICAN AMERICAN: 83 mL/min (ref >=60)
GFR, EST NON AFRICAN AMERICAN: 72 mL/min (ref >=60)
Glucose, Bld: 115 mg/dL — ABNORMAL HIGH (ref 65–99)
POTASSIUM: 5.2 mmol/L (ref 3.5–5.3)
SODIUM: 143 mmol/L (ref 135–146)

## 2015-07-22 LAB — POCT GLYCOSYLATED HEMOGLOBIN (HGB A1C): Hemoglobin A1C: 6.6

## 2015-07-22 NOTE — Progress Notes (Signed)
   Subjective:    Patient ID: Emma Craig, female    DOB: 02/10/1951, 64 y.o.   MRN: [id number]  HPI Diabetes - no hypoglycemic events. No wounds or sores that are not healing well. No increased thirst or urination. Checking glucose at home. Taking medications as prescribed without any side effects. Last A1C was 6.4.    Hypertension- Pt denies chest pain, SOB, dizziness, or heart palpitations.  Taking meds as directed w/o problems.  Denies medication side effects.      Review of Systems     Objective:   Physical Exam  Constitutional: She is oriented to person, place, and time. She appears well-developed and well-nourished.  HENT:  Head: Normocephalic and atraumatic.  Cardiovascular: Normal rate, regular rhythm and normal heart sounds.   Pulmonary/Chest: Effort normal and breath sounds normal.  Neurological: She is alert and oriented to person, place, and time.  Skin: Skin is warm and dry.  Psychiatric: She has a normal mood and affect. Her behavior is normal.          Assessment & Plan:  DM - well controlled. Continue current regimen. Follow-up in 4 months. Due for BMP.  A1C is 6.6 today.    HTN - well controlled. Continue current regimen.  Due for Hep C and HIV screening.    Declined flu shot.

## 2015-07-23 LAB — HIV ANTIBODY (ROUTINE TESTING W REFLEX): HIV 1&2 Ab, 4th Generation: NONREACTIVE

## 2015-11-17 ENCOUNTER — Encounter: Payer: Self-pay | Admitting: Family Medicine

## 2015-11-17 ENCOUNTER — Ambulatory Visit (INDEPENDENT_AMBULATORY_CARE_PROVIDER_SITE_OTHER): Admitting: Family Medicine

## 2015-11-17 VITALS — BP 158/56 | HR 98 | Temp 97.8°F | Wt 207.0 lb

## 2015-11-17 DIAGNOSIS — E119 Type 2 diabetes mellitus without complications: Secondary | ICD-10-CM

## 2015-11-17 DIAGNOSIS — I10 Essential (primary) hypertension: Secondary | ICD-10-CM

## 2015-11-17 LAB — POCT UA - MICROALBUMIN
Creatinine, POC: 300 mg/dL
Microalbumin Ur, POC: 30 mg/L

## 2015-11-17 LAB — POCT GLYCOSYLATED HEMOGLOBIN (HGB A1C): Hemoglobin A1C: 6.8

## 2015-11-17 NOTE — Progress Notes (Signed)
   Subjective:    Patient ID: Emma Craig, female    DOB: 12/13/1950, 64 y.o.   MRN: [id number]  HPI Diabetes - no hypoglycemic events. No wounds or sores that are not healing well. No increased thirst or urination. Not checking glucose at home. Taking medications as prescribed without any side effects.  Hypertension- Pt denies chest pain, SOB, dizziness, or heart palpitations.  Taking meds as directed w/o problems.  Denies medication side effects.     Review of Systems     Objective:   Physical Exam  Constitutional: She is oriented to person, place, and time. She appears well-developed and well-nourished.  HENT:  Head: Normocephalic and atraumatic.  Cardiovascular: Normal rate, regular rhythm and normal heart sounds.   Pulmonary/Chest: Effort normal and breath sounds normal.  Neurological: She is alert and oriented to person, place, and time.  Skin: Skin is warm and dry.  Psychiatric: She has a normal mood and affect. Her behavior is normal.          Assessment & Plan:  DM- well controlled.  But up from last time. Dong well. Work on diet and exercise.    HTN - she has gained a few pounds back after she had worked hard to lose it.  She wants to work on this before we add another medication. Consider adding HCT. She really wants to hold off.   Declined flu vaccine.   Declined pap smear.

## 2015-12-27 ENCOUNTER — Other Ambulatory Visit: Payer: Self-pay | Admitting: Family Medicine

## 2016-01-03 ENCOUNTER — Other Ambulatory Visit: Payer: Self-pay | Admitting: Family Medicine

## 2016-01-09 ENCOUNTER — Other Ambulatory Visit: Payer: Self-pay | Admitting: Family Medicine

## 2016-01-21 ENCOUNTER — Other Ambulatory Visit: Payer: Self-pay | Admitting: Family Medicine

## 2016-02-16 ENCOUNTER — Ambulatory Visit (INDEPENDENT_AMBULATORY_CARE_PROVIDER_SITE_OTHER): Admitting: Family Medicine

## 2016-02-16 ENCOUNTER — Encounter: Payer: Self-pay | Admitting: Family Medicine

## 2016-02-16 VITALS — BP 138/61 | HR 85 | Wt 200.0 lb

## 2016-02-16 DIAGNOSIS — E038 Other specified hypothyroidism: Secondary | ICD-10-CM

## 2016-02-16 DIAGNOSIS — I10 Essential (primary) hypertension: Secondary | ICD-10-CM | POA: Diagnosis not present

## 2016-02-16 DIAGNOSIS — E119 Type 2 diabetes mellitus without complications: Secondary | ICD-10-CM

## 2016-02-16 LAB — COMPLETE METABOLIC PANEL WITH GFR
ALBUMIN: 4.1 g/dL (ref 3.6–5.1)
ALK PHOS: 91 U/L (ref 33–130)
ALT: 44 U/L — ABNORMAL HIGH (ref 6–29)
AST: 25 U/L (ref 10–35)
BUN: 20 mg/dL (ref 7–25)
CO2: 28 mmol/L (ref 20–31)
Calcium: 8.9 mg/dL (ref 8.6–10.4)
Chloride: 103 mmol/L (ref 98–110)
Creat: 1.04 mg/dL — ABNORMAL HIGH (ref 0.50–0.99)
GFR, EST NON AFRICAN AMERICAN: 58 mL/min — AB (ref >=60)
GFR, Est African American: 67 mL/min (ref >=60)
GLUCOSE: 118 mg/dL — AB (ref 65–99)
POTASSIUM: 4.9 mmol/L (ref 3.5–5.3)
SODIUM: 142 mmol/L (ref 135–146)
Total Bilirubin: 0.3 mg/dL (ref 0.2–1.2)
Total Protein: 6.5 g/dL (ref 6.1–8.1)

## 2016-02-16 LAB — LIPID PANEL
CHOL/HDL RATIO: 3.6 ratio (ref <=5.0)
CHOLESTEROL: 138 mg/dL (ref 125–200)
HDL: 38 mg/dL — AB (ref >=46)
LDL Cholesterol: 71 mg/dL (ref <130)
Triglycerides: 143 mg/dL (ref <150)
VLDL: 29 mg/dL (ref <30)

## 2016-02-16 LAB — TSH: TSH: 0.9 mIU/L

## 2016-02-16 LAB — POCT GLYCOSYLATED HEMOGLOBIN (HGB A1C): Hemoglobin A1C: 6.4

## 2016-02-16 NOTE — Progress Notes (Signed)
   Subjective:    Patient ID: Emma Craig, female    DOB: July 24, 1951, 65 y.o.   MRN: [id number]  HPI Diabetes - no hypoglycemic events. No wounds or sores that are not healing well. No increased thirst or urination. Checking glucose at home. Taking medications as prescribed without any side effects.  She ahs lost 7 lbs.   Hypertension- Pt denies chest pain, SOB, dizziness, or heart palpitations.  Taking meds as directed w/o problems.  Denies medication side effects.    Hypothyroid - will check thyroid.  No skin or hair changes.    Review of Systems     Objective:   Physical Exam  Constitutional: She is oriented to person, place, and time. She appears well-developed and well-nourished.  HENT:  Head: Normocephalic and atraumatic.  Cardiovascular: Normal rate, regular rhythm and normal heart sounds.   Pulmonary/Chest: Effort normal and breath sounds normal.  Neurological: She is alert and oriented to person, place, and time.  Skin: Skin is warm and dry.  Psychiatric: She has a normal mood and affect. Her behavior is normal.        Assessment & Plan:  DM - Well controlled. Reminded to get an UTD eye exam. Foot exam performed today.   HTN - Well controlled. Continue current regimen. Follow up in 6 mo.    Hypothyroid - due to recheck TSH.

## 2016-02-17 NOTE — Addendum Note (Signed)
Addended by: Terance Hart on: 02/17/2016 09:41 AM   Modules accepted: Orders

## 2016-02-25 ENCOUNTER — Other Ambulatory Visit (HOSPITAL_COMMUNITY)
Admission: RE | Admit: 2016-02-25 | Discharge: 2016-02-25 | Disposition: A | Discharge status: Home / Self Care | Source: Ambulatory Visit | Attending: Family Medicine | Admitting: Family Medicine

## 2016-02-25 ENCOUNTER — Encounter: Payer: Self-pay | Admitting: Family Medicine

## 2016-02-25 ENCOUNTER — Ambulatory Visit (INDEPENDENT_AMBULATORY_CARE_PROVIDER_SITE_OTHER): Admitting: Family Medicine

## 2016-02-25 VITALS — BP 133/62 | HR 81 | Wt 198.0 lb

## 2016-02-25 DIAGNOSIS — Z01419 Encounter for gynecological examination (general) (routine) without abnormal findings: Secondary | ICD-10-CM | POA: Insufficient documentation

## 2016-02-25 DIAGNOSIS — Z124 Encounter for screening for malignant neoplasm of cervix: Secondary | ICD-10-CM | POA: Diagnosis not present

## 2016-02-25 DIAGNOSIS — Z1151 Encounter for screening for human papillomavirus (HPV): Secondary | ICD-10-CM | POA: Insufficient documentation

## 2016-02-25 LAB — HM DIABETES EYE EXAM

## 2016-02-25 NOTE — Progress Notes (Signed)
   Subjective:    Patient ID: Emma Craig, female    DOB: June 14, 1951, 66 y.o.   MRN: [id number]  HPI Emma Craig is here today for her Pap smear. Her last one was in 2005. She been previously declining them but again strongly encouraged her to come in and at least do one more Pap smear with [place] testing. The pelvic pain problems or abnormal discharge.   Review of Systems     Objective:   Physical Exam  Constitutional: She is oriented to person, place, and time. She appears well-developed and well-nourished.  HENT:  Head: Normocephalic and atraumatic.  Eyes: Conjunctivae and EOM are normal.  Cardiovascular: Normal rate.   Pulmonary/Chest: Effort normal.  Genitourinary: Uterus normal. Cervix exhibits no motion tenderness, no discharge and no friability. Right adnexum displays no mass, no tenderness and no fullness. Left adnexum displays no mass and no tenderness. No erythema, tenderness or bleeding in the vagina. No foreign body around the vagina. No signs of injury around the vagina. No vaginal discharge found.  Some vaginal atrophy.  Neurological: She is alert and oriented to person, place, and time.  Skin: Skin is dry. No pallor.  Psychiatric: She has a normal mood and affect. Her behavior is normal.  Vitals reviewed.         Assessment & Plan:  Cervical cancer screening-Pap smear performed. A little bit of vaginal atrophy but otherwise normal exam. Will call with results once available.

## 2016-02-26 LAB — CYTOLOGY - PAP

## 2016-02-28 NOTE — Progress Notes (Signed)
Quick Note:  Call patient: Your Pap smear is normal. Repeat in 5 years. ______ 

## 2016-03-16 ENCOUNTER — Encounter: Payer: Self-pay | Admitting: Family Medicine

## 2016-03-16 ENCOUNTER — Other Ambulatory Visit: Payer: Self-pay

## 2016-03-16 DIAGNOSIS — N183 Chronic kidney disease, stage 3 unspecified: Secondary | ICD-10-CM | POA: Insufficient documentation

## 2016-03-16 LAB — COMPLETE METABOLIC PANEL WITH GFR
ALBUMIN: 4.2 g/dL (ref 3.6–5.1)
ALK PHOS: 104 U/L (ref 33–130)
ALT: 37 U/L — ABNORMAL HIGH (ref 6–29)
AST: 23 U/L (ref 10–35)
BILIRUBIN TOTAL: 0.3 mg/dL (ref 0.2–1.2)
BUN: 18 mg/dL (ref 7–25)
CALCIUM: 9.3 mg/dL (ref 8.6–10.4)
CO2: 27 mmol/L (ref 20–31)
Chloride: 102 mmol/L (ref 98–110)
Creat: 1.08 mg/dL — ABNORMAL HIGH (ref 0.50–0.99)
GFR, EST NON AFRICAN AMERICAN: 55 mL/min — AB (ref >=60)
GFR, Est African American: 64 mL/min (ref >=60)
Glucose, Bld: 101 mg/dL — ABNORMAL HIGH (ref 65–99)
POTASSIUM: 4.2 mmol/L (ref 3.5–5.3)
Sodium: 140 mmol/L (ref 135–146)
Total Protein: 6.6 g/dL (ref 6.1–8.1)

## 2016-03-22 ENCOUNTER — Encounter: Payer: Self-pay | Admitting: Family Medicine

## 2016-03-22 ENCOUNTER — Ambulatory Visit (INDEPENDENT_AMBULATORY_CARE_PROVIDER_SITE_OTHER)

## 2016-03-22 DIAGNOSIS — N183 Chronic kidney disease, stage 3 (moderate): Secondary | ICD-10-CM | POA: Diagnosis not present

## 2016-03-22 DIAGNOSIS — K760 Fatty (change of) liver, not elsewhere classified: Secondary | ICD-10-CM | POA: Insufficient documentation

## 2016-05-07 ENCOUNTER — Encounter: Payer: Self-pay | Admitting: Family Medicine

## 2016-05-12 ENCOUNTER — Encounter: Payer: Self-pay | Admitting: Family Medicine

## 2016-05-12 ENCOUNTER — Ambulatory Visit (INDEPENDENT_AMBULATORY_CARE_PROVIDER_SITE_OTHER): Admitting: Family Medicine

## 2016-05-12 VITALS — BP 119/66 | HR 84 | Wt 190.0 lb

## 2016-05-12 DIAGNOSIS — R635 Abnormal weight gain: Secondary | ICD-10-CM

## 2016-05-12 DIAGNOSIS — I10 Essential (primary) hypertension: Secondary | ICD-10-CM | POA: Diagnosis not present

## 2016-05-12 DIAGNOSIS — E119 Type 2 diabetes mellitus without complications: Secondary | ICD-10-CM

## 2016-05-12 DIAGNOSIS — Z6835 Body mass index (BMI) 35.0-35.9, adult: Secondary | ICD-10-CM

## 2016-05-12 LAB — POCT GLYCOSYLATED HEMOGLOBIN (HGB A1C): HEMOGLOBIN A1C: 6.7

## 2016-05-12 NOTE — Progress Notes (Signed)
Subjective:    CC: DM  HPI:  Diabetes - no hypoglycemic events. No wounds or sores that are not healing well. No increased thirst or urination. Checking glucose at home. Taking medications as prescribed without any side effects.  She has really changed her diet to low carb but has been eating 3 apples a day. Says her ankle swelling has really gone down.    Hypertension- Pt denies chest pain, SOB, dizziness, or heart palpitations.  Taking meds as directed w/o problems.  Denies medication side effects.    Abnormal weight gain/obesity-she's really done a great job and has lost 17 pounds since December. She's continue work on her diet and does clogging for exercise.  Past medical history, Surgical history, Family history not pertinant except as noted below, Social history, Allergies, and medications have been entered into the medical record, reviewed, and corrections made.   Review of Systems: No fevers, chills, night sweats, weight loss, chest pain, or shortness of breath.   Objective:    General: Well Developed, well nourished, and in no acute distress.  Neuro: Alert and oriented x3, extra-ocular muscles intact, sensation grossly intact.  HEENT: Normocephalic, atraumatic  Skin: Warm and dry, no rashes. Cardiac: Regular rate and rhythm, no murmurs rubs or gallops, no lower extremity edema.  Respiratory: Clear to auscultation bilaterally. Not using accessory muscles, speaking in full sentences.   Impression and Recommendations:    DM - Well controlled. Continue current regimen. Follow up in 3-4 months. Discussed possibly substituting one of her apples for a long vegetables such as carrots celery or cucumbers. Since there was a slight jump in her A1c this time.  Lab Results  Component Value Date   HGBA1C 6.4 05/12/2016   HTN - Well controlled. Continue current regimen. Follow up in 3 months.   Abnormal weight gain/BMI of 35-she's done fantastic and has lost 17 pounds over the last 6  months. Continue work on diet and exercise.

## 2016-05-19 ENCOUNTER — Ambulatory Visit: Admitting: Family Medicine

## 2016-06-04 ENCOUNTER — Telehealth: Payer: Self-pay | Admitting: Family Medicine

## 2016-06-04 NOTE — Telephone Encounter (Signed)
Konrad Dolores (patient's spouse) came into the office about Emma Craig's bill.  [employer] is denying the charges for her HPV screening $189.00.  I called [employer] and spoke with Rip Harbour and added the diagnosis of Z12.4 (Screening for cervical screening).  The claim has processed now with the added diagnosis.  I called patient's home number [phone]) and left a message.

## 2016-06-26 ENCOUNTER — Other Ambulatory Visit: Payer: Self-pay | Admitting: Family Medicine

## 2016-07-08 ENCOUNTER — Encounter: Payer: Self-pay | Admitting: Family Medicine

## 2016-07-08 ENCOUNTER — Ambulatory Visit (INDEPENDENT_AMBULATORY_CARE_PROVIDER_SITE_OTHER): Admitting: Family Medicine

## 2016-07-08 VITALS — BP 122/68 | HR 73 | Resp 18 | Wt 185.0 lb

## 2016-07-08 DIAGNOSIS — S46812A Strain of other muscles, fascia and tendons at shoulder and upper arm level, left arm, initial encounter: Secondary | ICD-10-CM

## 2016-07-08 NOTE — Progress Notes (Signed)
Subjective:    CC: Right shoulder pain   HPI: right shoulder since around july. not taking anything for this. she rates the pain 7/10, and achy. it hurts her to lay down on her r side. she was told 10 yrs ago she had arthritis in her shoulder.  No real alleviating factors. She notices that if she reaches down to get something and lifted it's more painful.  Past medical history, Surgical history, Family history not pertinant except as noted below, Social history, Allergies, and medications have been entered into the medical record, reviewed, and corrections made.   Review of Systems: No fevers, chills, night sweats, weight loss, chest pain, or shortness of breath.   Objective:    General: Well Developed, well nourished, and in no acute distress.  Neuro: Alert and oriented x3, extra-ocular muscles intact, sensation grossly intact.  HEENT: Normocephalic, atraumatic  Skin: Warm and dry, no rashes. Cardiac: Regular rate and rhythm, no murmurs rubs or gallops, no lower extremity edema.  Respiratory: Clear to auscultation bilaterally. Not using accessory muscles, speaking in full sentences. Shoulder: Shoulder with fairly normal range of motion. Nontender over the shoulder joint itself. Strength is 5 out of 5 at the shoulder elbow and wrist. She has a very weak liftoff test on the left compared to the right. Negative empty can test bilaterally.   Impression and Recommendations:   Subscapularis strain-given exercises to do for rotator cuff and band to do home therapy. Recommend Aleve twice a day for 5-7 days. Make sure to take with food and water. This will help with inflammation in the tendon. Return if not better in 3 weeks.

## 2016-07-08 NOTE — Patient Instructions (Signed)
Recommend Aleve twice a day for 5-7 days. Make sure to take with food and water. This will help with inflammation in the tendon. Return if not better in 3 weeks.

## 2016-08-24 ENCOUNTER — Ambulatory Visit (INDEPENDENT_AMBULATORY_CARE_PROVIDER_SITE_OTHER): Admitting: Family Medicine

## 2016-08-24 ENCOUNTER — Encounter: Payer: Self-pay | Admitting: Family Medicine

## 2016-08-24 VITALS — BP 137/56 | HR 69 | Wt 182.0 lb

## 2016-08-24 DIAGNOSIS — E119 Type 2 diabetes mellitus without complications: Secondary | ICD-10-CM

## 2016-08-24 DIAGNOSIS — N183 Chronic kidney disease, stage 3 unspecified: Secondary | ICD-10-CM

## 2016-08-24 DIAGNOSIS — I10 Essential (primary) hypertension: Secondary | ICD-10-CM

## 2016-08-24 LAB — BASIC METABOLIC PANEL WITH GFR
BUN: 15 mg/dL (ref 7–25)
CALCIUM: 9.2 mg/dL (ref 8.6–10.4)
CO2: 24 mmol/L (ref 20–31)
Chloride: 104 mmol/L (ref 98–110)
Creat: 0.95 mg/dL (ref 0.50–0.99)
GFR, Est African American: 74 mL/min (ref >=60)
GFR, Est Non African American: 64 mL/min (ref >=60)
Glucose, Bld: 108 mg/dL — ABNORMAL HIGH (ref 65–99)
Potassium: 4.4 mmol/L (ref 3.5–5.3)
SODIUM: 141 mmol/L (ref 135–146)

## 2016-08-24 LAB — POCT GLYCOSYLATED HEMOGLOBIN (HGB A1C): Hemoglobin A1C: 6.1

## 2016-08-24 NOTE — Progress Notes (Signed)
Subjective:    CC:   HPI:  Diabetes - no hypoglycemic events. No wounds or sores that are not healing well. No increased thirst or urination. Checking glucose at home. Taking medications as prescribed without any side effects.  Hypertension- Pt denies chest pain, SOB, dizziness, or heart palpitations.  Taking meds as directed w/o problems.  Denies medication side effects.    CKD - No recent changes. She did have a slight bump in renal function in March. Had normal US of the kidneys.     Past medical history, Surgical history, Family history not pertinant except as noted below, Social history, Allergies, and medications have been entered into the medical record, reviewed, and corrections made.   Review of Systems: No fevers, chills, night sweats, weight loss, chest pain, or shortness of breath.   Objective:    General: Well Developed, well nourished, and in no acute distress.  Neuro: Alert and oriented x3, extra-ocular muscles intact, sensation grossly intact.  HEENT: Normocephalic, atraumatic  Skin: Warm and dry, no rashes. Cardiac: Regular rate and rhythm, no murmurs rubs or gallops, no lower extremity edema.  Respiratory: Clear to auscultation bilaterally. Not using accessory muscles, speaking in full sentences.   Impression and Recommendations:   DM- Well controlled. Continue current regimen. Follow up in  Controlled. F/U in 6 mo. She is on a statin.    HTN - Well controlled. Continue current regimen. Follow up in  6 mo.   CKD -  Due to recheck.

## 2016-09-26 ENCOUNTER — Other Ambulatory Visit: Payer: Self-pay | Admitting: Family Medicine

## 2016-10-01 ENCOUNTER — Other Ambulatory Visit: Payer: Self-pay | Admitting: Family Medicine

## 2016-10-05 ENCOUNTER — Other Ambulatory Visit: Payer: Self-pay | Admitting: Family Medicine

## 2016-10-17 ENCOUNTER — Other Ambulatory Visit: Payer: Self-pay | Admitting: Family Medicine

## 2016-12-27 ENCOUNTER — Other Ambulatory Visit: Payer: Self-pay | Admitting: Family Medicine

## 2017-01-15 ENCOUNTER — Other Ambulatory Visit: Payer: Self-pay | Admitting: Family Medicine

## 2017-02-02 ENCOUNTER — Other Ambulatory Visit: Payer: Self-pay | Admitting: Family Medicine

## 2017-02-21 ENCOUNTER — Ambulatory Visit (INDEPENDENT_AMBULATORY_CARE_PROVIDER_SITE_OTHER): Admitting: Family Medicine

## 2017-02-21 VITALS — BP 132/104 | HR 70 | Wt 197.0 lb

## 2017-02-21 DIAGNOSIS — Z6836 Body mass index (BMI) 36.0-36.9, adult: Secondary | ICD-10-CM

## 2017-02-21 DIAGNOSIS — E119 Type 2 diabetes mellitus without complications: Secondary | ICD-10-CM | POA: Diagnosis not present

## 2017-02-21 DIAGNOSIS — I10 Essential (primary) hypertension: Secondary | ICD-10-CM

## 2017-02-21 DIAGNOSIS — M25511 Pain in right shoulder: Secondary | ICD-10-CM | POA: Diagnosis not present

## 2017-02-21 DIAGNOSIS — E038 Other specified hypothyroidism: Secondary | ICD-10-CM | POA: Diagnosis not present

## 2017-02-21 LAB — LIPID PANEL W/REFLEX DIRECT LDL
CHOL/HDL RATIO: 3.2 ratio (ref <5.0)
Cholesterol: 167 mg/dL (ref <200)
HDL: 53 mg/dL (ref >50)
LDL-CHOLESTEROL: 87 mg/dL
Non-HDL Cholesterol (Calc): 114 mg/dL (ref <130)
TRIGLYCERIDES: 174 mg/dL — AB (ref <150)

## 2017-02-21 LAB — COMPLETE METABOLIC PANEL WITH GFR
ALBUMIN: 4.1 g/dL (ref 3.6–5.1)
ALK PHOS: 90 U/L (ref 33–130)
ALT: 31 U/L — ABNORMAL HIGH (ref 6–29)
AST: 22 U/L (ref 10–35)
BUN: 10 mg/dL (ref 7–25)
CHLORIDE: 104 mmol/L (ref 98–110)
CO2: 27 mmol/L (ref 20–31)
Calcium: 9.2 mg/dL (ref 8.6–10.4)
Creat: 0.88 mg/dL (ref 0.50–0.99)
GFR, EST NON AFRICAN AMERICAN: 70 mL/min (ref >=60)
GFR, Est African American: 81 mL/min (ref >=60)
GLUCOSE: 118 mg/dL — AB (ref 65–99)
POTASSIUM: 5 mmol/L (ref 3.5–5.3)
SODIUM: 143 mmol/L (ref 135–146)
Total Bilirubin: 0.3 mg/dL (ref 0.2–1.2)
Total Protein: 6.8 g/dL (ref 6.1–8.1)

## 2017-02-21 LAB — POCT GLYCOSYLATED HEMOGLOBIN (HGB A1C): Hemoglobin A1C: 6.3

## 2017-02-21 LAB — TSH: TSH: 1.06 m[IU]/L

## 2017-02-21 NOTE — Progress Notes (Signed)
Subjective:    CC: DM, HTN  HPI:  Diabetes - no hypoglycemic events. No wounds or sores that are not healing well. No increased thirst or urination. Checking glucose at home. Taking medications as prescribed without any side effects.  Hypertension- Pt denies chest pain, SOB, dizziness, or heart palpitations.  Taking meds as directed w/o problems.  Denies medication side effects.    Still having a lot of right shoulder pain. She says she has noticed it's painful when she reaches out her arm during similar class. And she also notices that it's painful in particular when she throws a ball overhand to her grandchild. Initially diagnosed her with a subscapularis injury back in August. She says now it's actually bothering her almost every day.   Past medical history, Surgical history, Family history not pertinant except as noted below, Social history, Allergies, and medications have been entered into the medical record, reviewed, and corrections made.   Review of Systems: No fevers, chills, night sweats, weight loss, chest pain, or shortness of breath.   Objective:    General: Well Developed, well nourished, and in no acute distress.  Neuro: Alert and oriented x3, extra-ocular muscles intact, sensation grossly intact.  HEENT: Normocephalic, atraumatic  Skin: Warm and dry, no rashes. Cardiac: Regular rate and rhythm, no murmurs rubs or gallops, no lower extremity edema.  Respiratory: Clear to auscultation bilaterally. Not using accessory muscles, speaking in full sentences. Musculoskeletal-normal range of motion of right shoulder. Negative ENT can test bilaterally. She does have decreased range of motion when she rotates internally and reaches to her low back. She also has decreased external rotation to only about 45 compared to the opposite shoulder.   Impression and Recommendations:   HTN - BP borderline elevated today. Recommend follow-up in 23 weeks for repeat blood pressure check. She  also notes that she's gained a fair amount of weight since I last saw her and says she plans on getting back on track with exercise and diet to get that under control.  DM- Well controlled. Continue current regimen. Follow up in  3-4 months.     Right shoulder pain- possible subscapularis injury. Recommend referral to our sports medicine providers for further evaluation treatment since she said she did not improve with conservative care over the last 6 months.  Obesity/BMI 36-says she's gained some weight over the winter says she plans on getting back on track.

## 2017-02-21 NOTE — Patient Instructions (Signed)
Schedule an appt with one of our sports med docs for your right shoulder

## 2017-04-01 ENCOUNTER — Ambulatory Visit (INDEPENDENT_AMBULATORY_CARE_PROVIDER_SITE_OTHER)

## 2017-04-01 ENCOUNTER — Ambulatory Visit (INDEPENDENT_AMBULATORY_CARE_PROVIDER_SITE_OTHER): Admitting: Family Medicine

## 2017-04-01 VITALS — BP 147/60 | HR 83 | Wt 197.0 lb

## 2017-04-01 DIAGNOSIS — M19011 Primary osteoarthritis, right shoulder: Secondary | ICD-10-CM | POA: Diagnosis not present

## 2017-04-01 DIAGNOSIS — M7581 Other shoulder lesions, right shoulder: Secondary | ICD-10-CM

## 2017-04-01 DIAGNOSIS — M25511 Pain in right shoulder: Secondary | ICD-10-CM

## 2017-04-01 MED ORDER — DICLOFENAC SODIUM 1 % TD GEL: 2.0000 g | Freq: Four times a day (QID) | TRANSDERMAL | 11 refills | Status: DC

## 2017-04-01 NOTE — Patient Instructions (Signed)
Thank you for coming in today. Attend PT.  Apply voltaren gel to the top of the shoulder up to 4x daily for pain as needed. Recheck in 4-6 weeks.  Return sooner if needed.    Secondary Shoulder Impingement Syndrome Shoulder impingement syndrome is a condition that causes pain when connective tissues (tendons) surrounding the shoulder joint become pinched. These tendons are part of the group of muscles and tissues that help to stabilize the shoulder (rotator cuff). There are two types of impingement syndrome: primary and secondary. Secondary impingement syndrome occurs when movement of the shoulder joint is abnormal. This can happen if there is too much movement (laxity), too little movement (stiffness), or abnormal movement. What are the causes? This condition may be caused by:  Shoulder blade muscles that are weak or uncoordinated (scapular dyskinesis).  Glenohumeral instability. This is too much movement of the upper arm bone (humerus). This can result from:  Having loose joints.  An injury that happened during repeated overhead arm movements, such as throwing.  A hard, direct hit (blow) to the shoulder. This is rare. What increases the risk? You may be more likely to develop this condition if you have injured your shoulder in the past or if you are an athlete who participates in:  Sports that involve throwing, such as baseball.  Tennis.  Swimming.  Volleyball. What are the signs or symptoms? The main symptom of this condition is pain on the front or side of the shoulder. Pain may:  Get worse when lifting or raising the arm.  Get worse at night.  Wake you up from sleeping.  Feel sharp when the shoulder is moved, and then fade to an ache. Other signs and symptoms may include:  Tenderness.  Stiffness.  Inability to raise the arm above shoulder level or behind the body.  Weakness. How is this diagnosed? This condition may be diagnosed based on:  Your  symptoms.  Your medical history.  A physical exam.  Imaging tests, such as:  X-rays.  MRI.  Ultrasound. How is this treated? Treatment for this condition may include:  Resting your shoulder and avoiding all activities that cause pain or put stress on the shoulder.  Icing your shoulder.  NSAIDs to help reduce pain and swelling.  One or more injections of medicines to numb the area and reduce inflammation.  Physical therapy.  Surgery. This may be needed if nonsurgical treatments do not help. Surgery may involve stabilizing your shoulder and repairing your rotator cuff, as needed. Follow these instructions at home: Managing pain, stiffness, and swelling   If directed, put ice on the injured area.  Put ice in a plastic bag.  Place a towel between your skin and the bag.  Leave the ice on for 20 minutes, 2-3 times a day. Activity   Rest and return to your normal activities as told by your health care provider. Ask your health care provider what activities are safe for you.  Do exercises as told by your health care provider. General instructions   Do not use any tobacco products, including cigarettes, chewing tobacco, or e-cigarettes. Tobacco can delay healing. If you need help quitting, ask your health care provider.  Ask your health care provider when it is safe for you to drive.  Take over-the-counter and prescription medicines only as told by your health care provider.  Keep all follow-up visits as told by your health care provider. This is important. How is this prevented?  Give your body time to  rest between periods of activity.  Maintain physical fitness, including strength in your shoulder muscles and back muscles. Contact a health care provider if:  Your symptoms have not improved after 2-3 months of treatment.  Your symptoms are getting worse. This information is not intended to replace advice given to you by your health care provider. Make sure you  discuss any questions you have with your health care provider. Document Released: 11/15/2005 Document Revised: 07/22/2016 Document Reviewed: 10/18/2015 Elsevier Interactive Patient Education  2017 [place].   Secondary Shoulder Impingement Syndrome Rehab Ask your health care provider which exercises are safe for you. Do exercises exactly as told by your health care provider and adjust them as directed. It is normal to feel mild stretching, pulling, tightness, or discomfort as you do these exercises, but you should stop right away if you feel sudden pain or your pain gets worse. Do not begin these exercises until told by your health care provider. Stretching and range of motion exercise This exercise warms up your muscles and joints and improves the movement and flexibility of your neck and shoulder. This exercise also helps to relieve pain and stiffness. Exercise A: Cervical side bend   1. Using good posture, sit on a stable chair, or stand up. 2. Without moving your shoulders, slowly tilt your left / right ear toward your left / right shoulder until you feel a stretch in your neck muscles. You should be looking straight ahead. 3. Hold for __________ seconds. 4. Slowly return to the starting position. 5. Repeat on your left / right side. Repeat __________ times. Complete this exercise __________ times a day. Strengthening exercises These exercises build strength and endurance in your shoulder. Endurance is the ability to use your muscles for a long time, even after they get tired. Exercise B: Scapular protraction, supine   1. Lie on your back on a firm surface. Hold a __________ weight in your left / right hand. 2. Raise your left / right arm straight into the air so your hand is directly above your shoulder joint. 3. Push the weight into the air so your shoulder lifts off of the surface that you are lying on. Do not move your head, neck, or back. 4. Hold for __________  seconds. 5. Slowly return to the starting position. Let your muscles relax completely before you repeat this exercise. Repeat __________ times. Complete this exercise __________ times a day. Exercise C: Scapular retraction   1. Sit in a stable chair without armrests, or stand. 2. Secure an exercise band to a stable object in front of you so the band is at shoulder height. 3. Hold one end of the exercise band in each hand. Your palms should face down. 4. Squeeze your shoulder blades together and move your elbows slightly behind you. Do not shrug your shoulders while you do this. 5. Hold for __________ seconds. 6. Slowly return to the starting position. Repeat __________ times. Complete this exercise __________ times a day. Exercise D: Shoulder extension with scapular retraction   1. Sit in a stable chair without armrests, or stand. 2. Secure an exercise band to a stable object in front of you where it is above shoulder height. 3. Hold one end of the exercise band in each hand. 4. Straighten your elbows and lift your hands up to shoulder height. 5. Squeeze your shoulder blades together and pull your hands down to the sides of your thighs. Stop when your hands are straight down by your sides.  Do not let your hands go behind your body. 6. Hold for __________ seconds. 7. Slowly return to the starting position. Repeat __________ times. Complete this exercise __________ times a day. Exercise E: Shoulder abduction  1. Sit in a stable chair without armrests, or stand. 2. If directed, hold a __________ weight in your left / right hand. 3. Start with your arms straight down. Turn your left / right hand so your palm faces in, toward your body. 4. Slowly lift your left / right hand out to your side. Do not lift your hand above shoulder height.  Keep your arms straight.  Avoid shrugging your shoulder while you do this movement. Keep your shoulder blade tucked down toward the middle of your  back. 5. Hold for __________ seconds. 6. Slowly lower your arm, and return to the starting position. Repeat __________ times. Complete this exercise __________ times a day. This information is not intended to replace advice given to you by your health care provider. Make sure you discuss any questions you have with your health care provider. Document Released: 11/15/2005 Document Revised: 07/22/2016 Document Reviewed: 10/18/2015 Elsevier Interactive Patient Education  2017 [employer].

## 2017-04-01 NOTE — Progress Notes (Signed)
   Subjective:    I'm seeing this patient as a consultation for:  METHENEY,CATHERINE, MD   CC: Right Shoulder Pain  HPI: Emma Craig a 6-12 month history of pain in the right shoulder. The pain occurs at the superior aspect of her shoulder and along the lateral upper arm. Her pain is worse with overhead motion reaching back at bedtime and with throwing. She denies any radiating pain or neck pain. No fevers or chills or injury. She has tried some tumor and Aleve which have helped. She has a pertinent past medical history for diabetes and hypothyroidism both of which are well-controlled.  Past medical history, Surgical history, Family history not pertinant except as noted below, Social history, Allergies, and medications have been entered into the medical record, reviewed, and no changes needed.   Review of Systems: No headache, visual changes, nausea, vomiting, diarrhea, constipation, dizziness, abdominal pain, skin rash, fevers, chills, night sweats, weight loss, swollen lymph nodes, body aches, joint swelling, muscle aches, chest pain, shortness of breath, mood changes, visual or auditory hallucinations.   Objective:    Vitals:   04/01/17 0815  BP: (!) 147/60  Pulse: 83   General: Well Developed, well nourished, and in no acute distress.  Neuro/Psych: Alert and oriented x3, extra-ocular muscles intact, able to move all 4 extremities, sensation grossly intact. Skin: Warm and dry, no rashes noted.  Respiratory: Not using accessory muscles, speaking in full sentences, trachea midline.  Cardiovascular: Pulses palpable, no extremity edema. Abdomen: Does not appear distended. MSK:  C-spine: Nontender to midline. Normal neck motion. Right shoulder normal-appearing. Mildly tender palpation overlying the acromioclavicular joint. Range of motion: Normal external motion Internal rotation to the lumbar spine Abduction full but painful arc Positive crossover arm compression test. Negative  Hawkins test positive Neer's test. Negative empty can test. Negative Yergason's and speeds test. Intact strength.    No results found for this or any previous visit (from the past 24 hour(s)). Dg Shoulder Right  Result Date: 04/01/2017 CLINICAL DATA:  Chronic right shoulder pain without known injury. EXAM: RIGHT SHOULDER - 2+ VIEW COMPARISON:  None. FINDINGS: There is no evidence of fracture or dislocation. There is no evidence of arthropathy or other focal bone abnormality. Soft tissues are unremarkable. IMPRESSION: Normal right shoulder. Electronically Signed   By: Marijo Conception, M.D.   On: 04/01/2017 08:36    Impression and Recommendations:    Assessment and Plan: 66 y.o. female with Right shoulder pain. Likely component of acromioclavicular degenerative changes based on compression test, and rotator cuff tendinopathy. We had a lengthy discussion about options. Plan for trial of physical therapy and diclofenac gel. Recheck in 4-6 weeks.  Discussed warning signs or symptoms. Please see discharge instructions. Patient expresses understanding.

## 2017-04-05 LAB — HM DIABETES EYE EXAM

## 2017-04-06 ENCOUNTER — Encounter: Payer: Self-pay | Admitting: Rehabilitative and Restorative Service Providers"

## 2017-04-06 ENCOUNTER — Ambulatory Visit (INDEPENDENT_AMBULATORY_CARE_PROVIDER_SITE_OTHER): Admitting: Rehabilitative and Restorative Service Providers"

## 2017-04-06 DIAGNOSIS — M25511 Pain in right shoulder: Secondary | ICD-10-CM | POA: Diagnosis not present

## 2017-04-06 DIAGNOSIS — R293 Abnormal posture: Secondary | ICD-10-CM | POA: Diagnosis not present

## 2017-04-06 DIAGNOSIS — G8929 Other chronic pain: Secondary | ICD-10-CM

## 2017-04-06 DIAGNOSIS — M6281 Muscle weakness (generalized): Secondary | ICD-10-CM | POA: Diagnosis not present

## 2017-04-06 NOTE — Patient Instructions (Signed)
Axial Extension (Chin Tuck)    Pull chin in and lengthen back of neck. Hold __3-5__ seconds while counting out loud. Repeat __10__ times. Do _2-3___ sessions per day.  Scapular Retraction (Standing)    With arms at sides, pinch shoulder blades together. Hold 10 seconds Repeat __10__ times per set.  Do __several__ sessions per day.  Flexion (Passive)    Sitting upright, slide forearm forward along table, bending from the waist until a stretch is felt. Hold _5___ seconds. Repeat __10__ times. Do _2___ sessions per day.  Copyright  VHI. All rights reserved.    TENS UNIT: This is helpful for muscle pain and spasm.   Search and Purchase a TENS 7000 2nd edition at www.tenspros.com. $36 + shipping.   Can also find on [place] for $19-26    TENS unit instructions: Do not shower or bathe with the unit on Turn the unit off before removing electrodes or batteries If the electrodes lose stickiness add a drop of water to the electrodes after they are disconnected from the unit and place on plastic sheet. If you continued to have difficulty, call the TENS unit company to purchase more electrodes. Do not apply lotion on the skin area prior to use. Make sure the skin is clean and dry as this will help prolong the life of the electrodes. After use, always check skin for unusual red areas, rash or other skin difficulties. If there are any skin problems, does not apply electrodes to the same area. Never remove the electrodes from the unit by pulling the wires. Do not use the TENS unit or electrodes other than as directed. Do not change electrode placement without consultating your therapist or physician. Keep 2 fingers with between each electrode. Wear time ratio is 2:1, on to off times.    For example on for 30 minutes off for 15 minutes and then on for 30 minutes off for 15 minutes

## 2017-04-06 NOTE — Therapy (Signed)
[place], [place], [address] Phone: [phone]   Fax:  [phone]  Physical Therapy Evaluation  Patient Details  Name: Emma Craig MRN: [id number] Date of Birth: [birth year] Referring Provider: Dr Lynne Leader   Encounter Date: 04/06/2017      PT End of Session - 04/06/17 0935    Visit Number 1   Number of Visits 12   Date for PT Re-Evaluation 05/18/17   PT Start Time 0935   PT Stop Time 1030   PT Time Calculation (min) 55 min   Activity Tolerance Patient tolerated treatment well      Past Medical History:  Diagnosis Date  . Chronic kidney disease, stage 2, mildly decreased GFR   . Diabetes mellitus   . Heavy menses   . Hyperlipidemia   . Hypertension   . Obesity     Past Surgical History:  Procedure Laterality Date  . CATARACT EXTRACTION  1-09   right   . CATARACT EXTRACTION  09/2011   left   . CHOLECYSTECTOMY     laproscopic  . rt elbow screws placed  10-82    There were no vitals filed for this visit.       Subjective Assessment - 04/06/17 0938    Subjective Patient reports that she has been having Rt shoudler pain for the past year. She had an xray which showed tendonitis. Has Rt shoudler pain with lying on the Rt and pain with lifting arm or reaching back. Symtpoms have changed in the past few months.    Pertinent History Rt shoulder pain ~ 10 years ago; Rt elbow fracture with ORIF 1982; HTN; borderline AODM   How long can you sit comfortably? not at all - pain all the time    Diagnostic tests xrays    Patient Stated Goals get rid of pain; use the arm normally    Currently in Pain? Yes   Pain Score 4    Pain Location Shoulder   Pain Orientation Right   Pain Descriptors / Indicators Dull;Sharp;Nagging;Aching  sharp when bring arm back    Pain Type Chronic pain   Pain Radiating Towards up nto the Rt side of the neck    Pain Onset More than a month ago   Pain Frequency  Intermittent   Aggravating Factors  lying on the Rt side; reaching up; reaching back; pulling; pushing   Pain Relieving Factors avoiding acitivities that irritate shoudler             [facility] PT Assessment - 04/06/17 0001      Assessment   Medical Diagnosis Rt shoudler pain; dysfunction    Referring Provider Dr Lynne Leader    Onset Date/Surgical Date 03/29/16   Hand Dominance Right   Next MD Visit 05/30/17   Prior Therapy none for shoulder      Precautions   Precautions None     Balance Screen   Has the patient fallen in the past 6 months No   Has the patient had a decrease in activity level because of a fear of falling?  No   Is the patient reluctant to leave their home because of a fear of falling?  No     Home Environment   Additional Comments single level home level entry      Prior Function   Level of Independence Independent   Vocation Retired   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  Leisure household chores; cooking; Administrator, Civil Service; zumba      Observation/Other Assessments   Focus on Therapeutic Outcomes (FOTO)  47% limitation      Sensation   Additional Comments intermittent numbness or tingling into the Rt hand when lying on the Rt side for ~30 minutes - resolves when she moves to change positions      Posture/Postural Control   Posture Comments head forward; shoudlers rounded and elevated; head of the humerus anterior in orientation; increased thoracic kyphosis; UE's in IR at sides; scapulae abducted and rotated along the thoracic wall      AROM   Right/Left Shoulder --  Assessed in standing; ER/IR at 90 deg shd abd    Right Shoulder Extension 53 Degrees   Right Shoulder Flexion 116 Degrees   Right Shoulder ABduction 119 Degrees   Right Shoulder Internal Rotation 14 Degrees   Right Shoulder External Rotation 43 Degrees   Left Shoulder Extension 48 Degrees   Left Shoulder Flexion 135 Degrees   Left Shoulder ABduction 141  Degrees   Left Shoulder Internal Rotation 42 Degrees   Left Shoulder External Rotation 90 Degrees   Cervical Flexion 58   Cervical Extension 37   Cervical - Right Side Bend 33   Cervical - Left Side Bend 37   Cervical - Right Rotation 70   Cervical - Left Rotation 73     Strength   Right/Left Shoulder --  discomfort with resisted Rt shd fles/abd/ER/IR    Right Shoulder Flexion 4+/5   Right Shoulder ABduction 4+/5   Right Shoulder External Rotation 4+/5   Left Shoulder Flexion 5/5   Left Shoulder ABduction 5/5     Palpation   Palpation comment tightness Rt pecs; upper trap; leveator; teres; deltoid; biceps tendon area                    [facility] Adult PT Treatment/Exercise - 04/06/17 0001      Exercises   Exercises Neck;Shoulder     Neck Exercises: Standing   Neck Retraction 5 reps;3 secs   Neck Retraction Limitations req tactile cues      Neck Exercises: Supine   Other Supine Exercise axial ext, 3 sec hold x 10     Shoulder Exercises: Standing   Other Standing Exercises scap squeeze with back against pool noodle x 10 sec x 10 reps.      Shoulder Exercises: Stretch   Table Stretch - Flexion 5 reps;10 seconds     Modalities   Modalities Electrical Stimulation;Moist Heat     Moist Heat Therapy   Number Minutes Moist Heat 15 Minutes   Moist Heat Location Shoulder  Rt     Electrical Stimulation   Electrical Stimulation Location Rt shoulder   Electrical Stimulation Action IFC   Electrical Stimulation Parameters to tolerance    Electrical Stimulation Goals Pain                PT Education - 04/06/17 1028    Education provided Yes   Education Details HEP,  plan of care   Person(s) Educated Patient   Methods Explanation;Handout;Verbal cues   Comprehension Returned demonstration;Verbalized understanding             PT Long Term Goals - 04/06/17 1008      PT LONG TERM GOAL #1   Title Improve posture and alignment with patient to  demonstrate improved upright posture with improve position of scapulae along the thoracic wall 05/18/17   Time  6   Period Weeks   Status New     PT LONG TERM GOAL #2   Title Increase AROM Rt shoudler to equal or greater than AROM Lt shoudler 05/18/17   Time 6   Period Weeks   Status New     PT LONG TERM GOAL #3   Title 5/5 strength without pain with resistive testing Rt shoudler 05/18/17   Time 6   Period Weeks   Status New     PT LONG TERM GOAL #4   Title Return to normal functional activities with Rt UE without pain 05/18/17   Time 6   Period Weeks   Status New     PT LONG TERM GOAL #5   Title Independent in HEP 05/18/17   Time 6   Period Weeks   Status New     PT LONG TERM GOAL #6   Title Improve FOTO to </= 33% limitation 05/18/17   Time 6   Period Weeks   Status New               Plan - 04/06/17 1003    Clinical Impression Statement Jayli is seen for low complexity evaluation of Rt shoudler dysfunction. She has a one year history of Rt shoudler pain. She presents with poor posture and alignmnet; abnormal movement patterns including scapular dyskinesis; limited AROM shoulder and cervical spine; decreased strength and functional actifity tolerance Rt UE; difficulty sleeping due to pain. Verlena will benefit form PT to address problems identified and return to normal functional activities with Rt UE.    Rehab Potential Good   PT Frequency 2x / week   PT Duration 6 weeks   PT Treatment/Interventions Patient/family education;ADLs/Self Care Home Management;Neuromuscular re-education;Cryotherapy;Electrical Stimulation;Iontophoresis 4mg /ml Dexamethasone;Moist Heat;Ultrasound;Dry needling;Manual techniques   PT Next Visit Plan postural correction; stretch pecs/ant chest; neuromuscular re-education posterior shoulder girdle; manual work Rt shoulder girdle; modalities as indicated    Consulted and Agree with Plan of Care Patient      Patient will benefit from skilled  therapeutic intervention in order to improve the following deficits and impairments:  Improper body mechanics, Postural dysfunction, Decreased range of motion, Decreased mobility, Decreased strength, Impaired UE functional use, Decreased activity tolerance  Visit Diagnosis: Chronic right shoulder pain - Plan: PT plan of care cert/re-cert  Abnormal posture - Plan: PT plan of care cert/re-cert  Muscle weakness (generalized) - Plan: PT plan of care cert/re-cert     Problem List Patient Active Problem List   Diagnosis Date Noted  . DJD of right AC (acromioclavicular) joint 04/01/2017  . Rotator cuff tendonitis, right 04/01/2017  . Fatty liver 03/22/2016  . CKD (chronic kidney disease) stage 3, GFR 30-59 ml/min 03/16/2016  . FATIGUE 12/16/2009  . Hypothyroidism 11/01/2008  . Obesity 08/27/2008  . SLEEP DISORDER 01/17/2008  . Diabetes mellitus without complication ([place]) 42/87/6811  . HYPERLIPIDEMIA 09/14/2006  . Essential hypertension 09/14/2006    Cannie Muckle Nilda Simmer PT, MPH  04/06/2017, 12:09 PM  [facility] [place], [place], [address] Phone: [phone]   Fax:  [phone]  Name: Kathrene Sinopoli MRN: [id number] Date of Birth: July 06, 1951

## 2017-04-08 ENCOUNTER — Ambulatory Visit (INDEPENDENT_AMBULATORY_CARE_PROVIDER_SITE_OTHER): Admitting: Rehabilitative and Restorative Service Providers"

## 2017-04-08 ENCOUNTER — Encounter: Payer: Self-pay | Admitting: Rehabilitative and Restorative Service Providers"

## 2017-04-08 DIAGNOSIS — R293 Abnormal posture: Secondary | ICD-10-CM

## 2017-04-08 DIAGNOSIS — M6281 Muscle weakness (generalized): Secondary | ICD-10-CM

## 2017-04-08 DIAGNOSIS — M25511 Pain in right shoulder: Secondary | ICD-10-CM | POA: Diagnosis not present

## 2017-04-08 DIAGNOSIS — G8929 Other chronic pain: Secondary | ICD-10-CM

## 2017-04-08 NOTE — Therapy (Signed)
[place], [place], [address] Phone: [phone]   Fax:  [phone]  Physical Therapy Treatment  Patient Details  Name: Emma Craig MRN: [id number] Date of Birth: 1951/08/13 Referring Provider: Dr Lynne Leader   Encounter Date: 04/08/2017      PT End of Session - 04/08/17 1022    Visit Number 2   Number of Visits 12   Date for PT Re-Evaluation 05/18/17   PT Start Time 1017   PT Stop Time 1110   PT Time Calculation (min) 53 min   Activity Tolerance Patient tolerated treatment well      Past Medical History:  Diagnosis Date  . Chronic kidney disease, stage 2, mildly decreased GFR   . Diabetes mellitus   . Heavy menses   . Hyperlipidemia   . Hypertension   . Obesity     Past Surgical History:  Procedure Laterality Date  . CATARACT EXTRACTION  1-09   right   . CATARACT EXTRACTION  09/2011   left   . CHOLECYSTECTOMY     laproscopic  . rt elbow screws placed  10-82    There were no vitals filed for this visit.      Subjective Assessment - 04/08/17 1022    Subjective Working on exercise and posture at home. Shoulder is some feeling better.     Currently in Pain? Yes   Pain Score 4    Pain Location Shoulder   Pain Orientation Right   Pain Descriptors / Indicators Dull;Sharp;Nagging;Aching   Pain Type Chronic pain   Pain Radiating Towards up into the Rt side of her neck    Pain Onset More than a month ago   Pain Frequency Intermittent                         [facility] Adult PT Treatment/Exercise - 04/08/17 0001      Therapeutic Activites    Therapeutic Activities --  instructed in myofacial release w/ ball      Neck Exercises: Standing   Neck Retraction 5 reps;3 secs   Neck Retraction Limitations req tactile cues      Neck Exercises: Supine   Other Supine Exercise AAROM shd flexion x1 10 sec hold      Shoulder Exercises: Standing   Other Standing Exercises  scap squeeze with back against pool noodle x 10 sec x 10 reps.      Shoulder Exercises: Pulleys   Flexion --  5 min     Shoulder Exercises: Stretch   Table Stretch - Flexion 5 reps;10 seconds     Moist Heat Therapy   Number Minutes Moist Heat 20 Minutes   Moist Heat Location Shoulder  Rt     Electrical Stimulation   Electrical Stimulation Location Rt shoulder   Electrical Stimulation Action IFC   Electrical Stimulation Parameters to tolerance   Electrical Stimulation Goals Pain;Tone     Manual Therapy   Manual therapy comments pt supine    Joint Mobilization Rt GH joint    Soft tissue mobilization Rt shoulder girdle - pecs; upper trap; leveator; deltiod; teres; biceps    Myofascial Release anterior chest/biceps   Scapular Mobilization RT   Passive ROM PROM Rt shoudler flexion; scaption; ER in scaption                 PT Education - 04/08/17 1053    Education provided Yes   Education Details HEP  TENS   Person(s) Educated Patient   Methods Explanation;Demonstration;Tactile cues;Verbal cues;Handout   Comprehension Verbalized understanding;Returned demonstration;Verbal cues required;Tactile cues required             PT Long Term Goals - 04/06/17 1008      PT LONG TERM GOAL #1   Title Improve posture and alignment with patient to demonstrate improved upright posture with improve position of scapulae along the thoracic wall 05/18/17   Time 6   Period Weeks   Status New     PT LONG TERM GOAL #2   Title Increase AROM Rt shoudler to equal or greater than AROM Lt shoudler 05/18/17   Time 6   Period Weeks   Status New     PT LONG TERM GOAL #3   Title 5/5 strength without pain with resistive testing Rt shoudler 05/18/17   Time 6   Period Weeks   Status New     PT LONG TERM GOAL #4   Title Return to normal functional activities with Rt UE without pain 05/18/17   Time 6   Period Weeks   Status New     PT LONG TERM GOAL #5   Title Independent in HEP 05/18/17    Time 6   Period Weeks   Status New     PT LONG TERM GOAL #6   Title Improve FOTO to </= 33% limitation 05/18/17   Time 6   Period Weeks   Status New               Plan - 04/08/17 1030    Clinical Impression Statement Good response to initial treatment and home program. Tolerated exercise with minimal increase in pain. Good response to manual work and modalities. Will benefit from continued manual work through [place] shoulder girdle    Rehab Potential Good   PT Frequency 2x / week   PT Duration 6 weeks   PT Treatment/Interventions Patient/family education;ADLs/Self Care Home Management;Neuromuscular re-education;Cryotherapy;Electrical Stimulation;Iontophoresis 4mg /ml Dexamethasone;Moist Heat;Ultrasound;Dry needling;Manual techniques   PT Next Visit Plan postural correction; stretch pecs/ant chest; neuromuscular re-education posterior shoulder girdle; manual work Rt shoulder girdle; modalities as indicated    Consulted and Agree with Plan of Care Patient      Patient will benefit from skilled therapeutic intervention in order to improve the following deficits and impairments:  Improper body mechanics, Postural dysfunction, Decreased range of motion, Decreased mobility, Decreased strength, Impaired UE functional use, Decreased activity tolerance  Visit Diagnosis: Chronic right shoulder pain  Abnormal posture  Muscle weakness (generalized)     Problem List Patient Active Problem List   Diagnosis Date Noted  . DJD of right AC (acromioclavicular) joint 04/01/2017  . Rotator cuff tendonitis, right 04/01/2017  . Fatty liver 03/22/2016  . CKD (chronic kidney disease) stage 3, GFR 30-59 ml/min 03/16/2016  . FATIGUE 12/16/2009  . Hypothyroidism 11/01/2008  . Obesity 08/27/2008  . SLEEP DISORDER 01/17/2008  . Diabetes mellitus without complication ([place]) 85/46/2703  . HYPERLIPIDEMIA 09/14/2006  . Essential hypertension 09/14/2006    Aarohi Redditt Nilda Simmer PT, MPH  04/08/2017, 10:55  AM  [facility] [place], [place], [address] Phone: [phone]   Fax:  [phone]  Name: Emma Craig MRN: [id number] Date of Birth: 01/11/1951

## 2017-04-08 NOTE — Patient Instructions (Addendum)
Scapula Adduction With Pectoralis Stretch: Low - Standing   Shoulders at 45 hands even with shoulders, keeping weight through legs, shift weight forward until you feel pull or stretch through the front of your chest. Hold _30__ seconds. Do _3__ times, _2-4__ times per day.   Scapula Adduction With Pectoralis Stretch: Mid-Range - Standing   Shoulders at 90 elbows even with shoulders, keeping weight through legs, shift weight forward until you feel pull or strength through the front of your chest. Hold __30_ seconds. Do _3__ times, __2-4_ times per day.   Scapula Adduction With Pectoralis Stretch: High - Standing   Shoulders at 120 hands up high on the doorway, keeping weight on feet, shift weight forward until you feel pull or stretch through the front of your chest. Hold _30__ seconds. Do _3__ times, _2-3__ times per day.  Self massage using a 4 inch plastic ball   TENS UNIT: This is helpful for muscle pain and spasm.   Search and Purchase a TENS 7000 2nd edition at www.tenspros.com. It should be less than $30.     TENS unit instructions: Do not shower or bathe with the unit on Turn the unit off before removing electrodes or batteries If the electrodes lose stickiness add a drop of water to the electrodes after they are disconnected from the unit and place on plastic sheet. If you continued to have difficulty, call the TENS unit company to purchase more electrodes. Do not apply lotion on the skin area prior to use. Make sure the skin is clean and dry as this will help prolong the life of the electrodes. After use, always check skin for unusual red areas, rash or other skin difficulties. If there are any skin problems, does not apply electrodes to the same area. Never remove the electrodes from the unit by pulling the wires. Do not use the TENS unit or electrodes other than as directed. Do not change electrode placement without consultating your therapist or physician. Keep 2  fingers with between each electrode.

## 2017-04-11 ENCOUNTER — Ambulatory Visit (INDEPENDENT_AMBULATORY_CARE_PROVIDER_SITE_OTHER): Admitting: Physical Therapy

## 2017-04-11 DIAGNOSIS — R293 Abnormal posture: Secondary | ICD-10-CM

## 2017-04-11 DIAGNOSIS — G8929 Other chronic pain: Secondary | ICD-10-CM

## 2017-04-11 DIAGNOSIS — M6281 Muscle weakness (generalized): Secondary | ICD-10-CM | POA: Diagnosis not present

## 2017-04-11 DIAGNOSIS — M25511 Pain in right shoulder: Secondary | ICD-10-CM

## 2017-04-11 NOTE — Therapy (Signed)
[place], [place], [address] Phone: [phone]   Fax:  [phone]  Physical Therapy Treatment  Patient Details  Name: Emma Craig MRN: [id number] Date of Birth: 1951/09/11 Referring Provider: Dr. Lynne Leader  Encounter Date: 04/11/2017      PT End of Session - 04/11/17 0807    Visit Number 3   Number of Visits 12   Date for PT Re-Evaluation 05/18/17   PT Start Time 0802   PT Stop Time 2505   PT Time Calculation (min) 55 min   Activity Tolerance Patient tolerated treatment well   Behavior During Therapy [facility] for tasks assessed/performed      Past Medical History:  Diagnosis Date  . Chronic kidney disease, stage 2, mildly decreased GFR   . Diabetes mellitus   . Heavy menses   . Hyperlipidemia   . Hypertension   . Obesity     Past Surgical History:  Procedure Laterality Date  . CATARACT EXTRACTION  1-09   right   . CATARACT EXTRACTION  09/2011   left   . CHOLECYSTECTOMY     laproscopic  . rt elbow screws placed  10-82    There were no vitals filed for this visit.      Subjective Assessment - 04/11/17 0803    Subjective Pt reports she is sore from last visit.  She has noticed she can pull arm back (extension) without the grabbing pain.  She can sleep on her Rt side for a few hours.    Pertinent History Rt shoulder pain ~ 10 years ago; Rt elbow fracture with ORIF 1982; HTN; borderline AODM   Currently in Pain? Yes   Pain Score 4    Pain Location Shoulder   Pain Orientation Right   Pain Descriptors / Indicators Constant;Dull   Aggravating Factors  lying on Rt side. Reaching for something   Pain Relieving Factors avoiding activities that irritate shoulder.             [facility] PT Assessment - 04/11/17 0001      Assessment   Medical Diagnosis Rt shoulder pain; dysfunction    Referring Provider Dr. Lynne Leader   Onset Date/Surgical Date 03/29/16   Hand Dominance Right   Next  MD Visit 05/30/17     AROM   Right Shoulder Flexion 135 Degrees  with pain, in standing   Right Shoulder ABduction 125 Degrees  with pain, in standing   Right Shoulder Internal Rotation 50 Degrees  supine, shoulder abdct~70 deg   Right Shoulder External Rotation 45 Degrees  supine, arm abdct ~70 deg; after stretches. initially 22 deg   Left Shoulder Flexion 145 Degrees         [facility] Adult PT Treatment/Exercise - 04/11/17 0001      Shoulder Exercises: Supine   External Rotation AAROM;Both;10 reps  cane   Flexion AAROM;Both;10 reps  cane   Other Supine Exercises Rt arm snow angels to tolerance (~90deg) x 10 reps.   Elbow press (both hands behind head) x 5 sec hold x 10 reps     Shoulder Exercises: Standing   Internal Rotation AAROM;Both;10 reps  cane, behind back.    Other Standing Exercises scap squeeze with back against pool noodle x 10 sec x 10 reps.      Shoulder Exercises: Pulleys   Flexion --  10 reps, 10 sec    ABduction --  10 reps, 10 sec      Moist Heat  Therapy   Number Minutes Moist Heat 15 Minutes   Moist Heat Location Shoulder  Rt     Electrical Stimulation   Electrical Stimulation Location Rt shoulder    Electrical Stimulation Action IFC   Electrical Stimulation Parameters to tolerance    Electrical Stimulation Goals Pain     Manual Therapy   Manual therapy comments pt supine    Joint Mobilization Rt GH joint    Myofascial Release Rt pec release    Passive ROM Rt shoulder abdct/  ER                 PT Education - 04/11/17 0853    Education provided Yes   Education Details HEP: elbow press             PT Long Term Goals - 04/11/17 0809      PT LONG TERM GOAL #1   Title Improve posture and alignment with patient to demonstrate improved upright posture with improve position of scapulae along the thoracic wall 05/18/17   Time 6   Period Weeks   Status On-going     PT LONG TERM GOAL #2   Title Increase AROM Rt shoulder to equal or  greater than AROM Lt shoudler 05/18/17   Time 6   Period Weeks   Status On-going     PT LONG TERM GOAL #3   Title 5/5 strength without pain with resistive testing Rt shoulder 05/18/17   Time 6   Period Weeks   Status On-going     PT LONG TERM GOAL #4   Title Return to normal functional activities with Rt UE without pain 05/18/17   Time 6   Period Weeks   Status On-going     PT LONG TERM GOAL #5   Title Independent in HEP 05/18/17   Time 6   Period Weeks   Status On-going     PT LONG TERM GOAL #6   Title Improve FOTO to </= 33% limitation 05/18/17   Time 6   Period Weeks   Status On-going               Plan - 04/11/17 1307    Clinical Impression Statement Pt's Rt shoulder ROM improving.  Pt tolerated all exercises well with minimal increase in pain.  Pain reduced with use of estim/MHP at end of session.  Progressing towards goals.    Rehab Potential Good   PT Frequency 2x / week   PT Duration 6 weeks   PT Treatment/Interventions Patient/family education;ADLs/Self Care Home Management;Neuromuscular re-education;Cryotherapy;Electrical Stimulation;Iontophoresis 4mg /ml Dexamethasone;Moist Heat;Ultrasound;Dry needling;Manual techniques   PT Next Visit Plan postural correction; stretch pecs/ant chest; neuromuscular re-education posterior shoulder girdle; manual work Rt shoulder girdle; modalities as indicated    Consulted and Agree with Plan of Care Patient      Patient will benefit from skilled therapeutic intervention in order to improve the following deficits and impairments:  Improper body mechanics, Postural dysfunction, Decreased range of motion, Decreased mobility, Decreased strength, Impaired UE functional use, Decreased activity tolerance  Visit Diagnosis: Chronic right shoulder pain  Abnormal posture  Muscle weakness (generalized)     Problem List Patient Active Problem List   Diagnosis Date Noted  . DJD of right AC (acromioclavicular) joint 04/01/2017   . Rotator cuff tendonitis, right 04/01/2017  . Fatty liver 03/22/2016  . CKD (chronic kidney disease) stage 3, GFR 30-59 ml/min 03/16/2016  . FATIGUE 12/16/2009  . Hypothyroidism 11/01/2008  . Obesity 08/27/2008  . SLEEP  DISORDER 01/17/2008  . Diabetes mellitus without complication ([place]) 22/24/1146  . HYPERLIPIDEMIA 09/14/2006  . Essential hypertension 09/14/2006   Kerin Perna, PTA 04/11/17 1:11 PM  [place], [place], [address] Phone: [phone]   Fax:  [phone]  Name: Emma Craig MRN: [id number] Date of Birth: 1951/06/15

## 2017-04-11 NOTE — Patient Instructions (Signed)
Elbow Press    Interlace fingers; bring hands underneath head. Press elbows down. Hold __5_ seconds. Relax arms. Repeat _10__ times.   [facility] at [facility] [place], [place] [address]  [phone] (office) [phone] (fax)

## 2017-04-14 ENCOUNTER — Ambulatory Visit (INDEPENDENT_AMBULATORY_CARE_PROVIDER_SITE_OTHER): Admitting: Rehabilitative and Restorative Service Providers"

## 2017-04-14 ENCOUNTER — Encounter: Payer: Self-pay | Admitting: Rehabilitative and Restorative Service Providers"

## 2017-04-14 DIAGNOSIS — G8929 Other chronic pain: Secondary | ICD-10-CM

## 2017-04-14 DIAGNOSIS — M25511 Pain in right shoulder: Secondary | ICD-10-CM | POA: Diagnosis not present

## 2017-04-14 DIAGNOSIS — R293 Abnormal posture: Secondary | ICD-10-CM

## 2017-04-14 DIAGNOSIS — M6281 Muscle weakness (generalized): Secondary | ICD-10-CM | POA: Diagnosis not present

## 2017-04-14 NOTE — Therapy (Signed)
[place], [place], [address] Phone: [phone]   Fax:  [phone]  Physical Therapy Treatment  Patient Details  Name: Emma Craig MRN: [id number] Date of Birth: 05-Jan-1951 Referring Provider: Dr. Lynne Leader  Encounter Date: 04/14/2017      PT End of Session - 04/14/17 0808    Visit Number 4   Number of Visits 12   Date for PT Re-Evaluation 05/18/17   PT Start Time 0808   PT Stop Time 0904   PT Time Calculation (min) 56 min   Activity Tolerance Patient tolerated treatment well      Past Medical History:  Diagnosis Date  . Chronic kidney disease, stage 2, mildly decreased GFR   . Diabetes mellitus   . Heavy menses   . Hyperlipidemia   . Hypertension   . Obesity     Past Surgical History:  Procedure Laterality Date  . CATARACT EXTRACTION  1-09   right   . CATARACT EXTRACTION  09/2011   left   . CHOLECYSTECTOMY     laproscopic  . rt elbow screws placed  10-82    There were no vitals filed for this visit.      Subjective Assessment - 04/14/17 0810    Subjective Shoulder is improving - still has some pain but can tell it is improving.    Currently in Pain? Yes   Pain Score 4    Pain Location Shoulder   Pain Orientation Right   Pain Descriptors / Indicators Dull   Pain Type Chronic pain   Pain Radiating Towards up into the Rt side of her neck but not as much    Pain Onset More than a month ago   Pain Frequency Constant   Aggravating Factors  lying on Rt side more than an hour; reaching    Pain Relieving Factors avoiding activities that irritate the shoulder                          [facility] Adult PT Treatment/Exercise - 04/14/17 0001      Shoulder Exercises: Standing   Extension Strengthening;Both;10 reps;Theraband   Theraband Level (Shoulder Extension) Level 2 (Red)   Extension Limitations active shd ext bilat w/cane x 10    Row Strengthening;Both;10  reps;Theraband   Theraband Level (Shoulder Row) Level 2 (Red)   Retraction Strengthening;Both;10 reps;Theraband   Theraband Level (Shoulder Retraction) Level 1 (Yellow)   Other Standing Exercises scap squeeze with back against pool noodle x 10 sec x 10 reps.    Other Standing Exercises W's with shallow knee bends x 10 x 2 sets     Shoulder Exercises: Pulleys   Flexion --  10 reps, 10 sec    ABduction --  10 reps, 10 sec      Shoulder Exercises: Therapy Ball   Flexion 5 reps  rolling ball out of table    Other Therapy Ball Exercises pressing down on large ball scapular depression 5 sec x 10      Shoulder Exercises: Stretch   Corner Stretch --  3 way doorway stretch 30 sec x 2 ea min stretch to tolerance   Internal Rotation Stretch 3 reps  20 sec strap up back in standing    Internal Rotation Stretch Limitations IR w/cane sliding cane up back      Moist Heat Therapy   Number Minutes Moist Heat 20 Minutes   Moist Heat Location Shoulder  Rt     Acupuncturist Location Rt shoulder    Electrical Stimulation Action IFC   Electrical Stimulation Parameters to tolerance   Electrical Stimulation Goals Pain;Tone     Manual Therapy   Manual therapy comments pt supine    Joint Mobilization Rt GH joint    Soft tissue mobilization Rt shoulder girdle - pecs; upper trap; leveator; deltiod; teres; biceps    Myofascial Release Rt pec release    Scapular Mobilization RT   Passive ROM Rt shoulder abdct/  ER                 PT Education - 04/14/17 0843    Education provided Yes   Education Details HEP   Person(s) Educated Patient   Methods Explanation;Demonstration;Tactile cues;Verbal cues;Handout   Comprehension Verbalized understanding;Returned demonstration;Verbal cues required;Tactile cues required             PT Long Term Goals - 04/11/17 0809      PT LONG TERM GOAL #1   Title Improve posture and alignment with patient to  demonstrate improved upright posture with improve position of scapulae along the thoracic wall 05/18/17   Time 6   Period Weeks   Status On-going     PT LONG TERM GOAL #2   Title Increase AROM Rt shoulder to equal or greater than AROM Lt shoudler 05/18/17   Time 6   Period Weeks   Status On-going     PT LONG TERM GOAL #3   Title 5/5 strength without pain with resistive testing Rt shoulder 05/18/17   Time 6   Period Weeks   Status On-going     PT LONG TERM GOAL #4   Title Return to normal functional activities with Rt UE without pain 05/18/17   Time 6   Period Weeks   Status On-going     PT LONG TERM GOAL #5   Title Independent in HEP 05/18/17   Time 6   Period Weeks   Status On-going     PT LONG TERM GOAL #6   Title Improve FOTO to </= 33% limitation 05/18/17   Time 6   Period Weeks   Status On-going               Plan - 04/14/17 6195    Clinical Impression Statement Continued porgress with decreased stiffness; improved ROM and function. Gradually progressing toward stated goals of therapy.    Rehab Potential Good   PT Frequency 2x / week   PT Duration 6 weeks   PT Treatment/Interventions Patient/family education;ADLs/Self Care Home Management;Neuromuscular re-education;Cryotherapy;Electrical Stimulation;Iontophoresis 4mg /ml Dexamethasone;Moist Heat;Ultrasound;Dry needling;Manual techniques   PT Next Visit Plan postural correction; stretch pecs/ant chest; neuromuscular re-education posterior shoulder girdle; manual work Rt shoulder girdle; modalities as indicated    Consulted and Agree with Plan of Care Patient      Patient will benefit from skilled therapeutic intervention in order to improve the following deficits and impairments:  Improper body mechanics, Postural dysfunction, Decreased range of motion, Decreased mobility, Decreased strength, Impaired UE functional use, Decreased activity tolerance  Visit Diagnosis: Chronic right shoulder pain  Abnormal  posture  Muscle weakness (generalized)     Problem List Patient Active Problem List   Diagnosis Date Noted  . DJD of right AC (acromioclavicular) joint 04/01/2017  . Rotator cuff tendonitis, right 04/01/2017  . Fatty liver 03/22/2016  . CKD (chronic kidney disease) stage 3, GFR 30-59 ml/min 03/16/2016  . FATIGUE 12/16/2009  .  Hypothyroidism 11/01/2008  . Obesity 08/27/2008  . SLEEP DISORDER 01/17/2008  . Diabetes mellitus without complication ([place]) 29/47/6546  . HYPERLIPIDEMIA 09/14/2006  . Essential hypertension 09/14/2006    Torrey Horseman Nilda Simmer PT, MPH  04/14/2017, 8:58 AM  [facility] [place], [place], [address] Phone: [phone]   Fax:  [phone]  Name: Emma Craig MRN: [id number] Date of Birth: [birth year]

## 2017-04-14 NOTE — Patient Instructions (Addendum)
Resisted External Rotation: in Neutral - Bilateral   PALMS UP Sit or stand, tubing in both hands, elbows at sides, bent to 90, forearms forward. Pinch shoulder blades together and rotate forearms out. Keep elbows at sides. Repeat __10__ times per set. Do _2-3___ sets per session. Do _2-3___ sessions per day.   Low Row: Standing   Face anchor, feet shoulder width apart. Palms up, pull arms back, squeezing shoulder blades together. Repeat 10__ times per set. Do 2-3__ sets per session. Do 2-3__ sessions per day. Anchor Height: Waist   Strengthening: Resisted Extension   Hold tubing in right hand, arm forward. Pull arm back, elbow straight. Repeat _10___ times per set. Do 2-3____ sets per session. Do 2-3____ sessions per day.    Internal Rotator Cuff Stretch, Standing    Stand holding strap or towel behind body, one arm above head, other arm bent behind back. With upper hand, pull gently upward. Hold _20-30__ seconds. Repeat _3__ times per session. Do _2__ sessions per day.

## 2017-04-18 ENCOUNTER — Ambulatory Visit (INDEPENDENT_AMBULATORY_CARE_PROVIDER_SITE_OTHER): Admitting: Physical Therapy

## 2017-04-18 DIAGNOSIS — R293 Abnormal posture: Secondary | ICD-10-CM | POA: Diagnosis not present

## 2017-04-18 DIAGNOSIS — G8929 Other chronic pain: Secondary | ICD-10-CM

## 2017-04-18 DIAGNOSIS — M25511 Pain in right shoulder: Secondary | ICD-10-CM

## 2017-04-18 DIAGNOSIS — M6281 Muscle weakness (generalized): Secondary | ICD-10-CM | POA: Diagnosis not present

## 2017-04-18 NOTE — Therapy (Signed)
[place], [place], [address] Phone: [phone]   Fax:  [phone]  Physical Therapy Treatment  Patient Details  Name: Emma Craig MRN: [id number] Date of Birth: [birth year] Referring Provider: Dr. Georgina Snell  Encounter Date: 04/18/2017      PT End of Session - 04/18/17 0721    Visit Number 5   Number of Visits 12   Date for PT Re-Evaluation 05/18/17   PT Start Time 0716   PT Stop Time 0809   PT Time Calculation (min) 53 min   Activity Tolerance Patient tolerated treatment well   Behavior During Therapy [facility] for tasks assessed/performed      Past Medical History:  Diagnosis Date  . Chronic kidney disease, stage 2, mildly decreased GFR   . Diabetes mellitus   . Heavy menses   . Hyperlipidemia   . Hypertension   . Obesity     Past Surgical History:  Procedure Laterality Date  . CATARACT EXTRACTION  1-09   right   . CATARACT EXTRACTION  09/2011   left   . CHOLECYSTECTOMY     laproscopic  . rt elbow screws placed  10-82    There were no vitals filed for this visit.      Subjective Assessment - 04/18/17 0719    Subjective Pt reports her shoulder is improving.  She was able to raise arms higher in [place] last week.    Currently in Pain? Yes   Pain Score 3    Pain Location Shoulder   Pain Orientation Right   Pain Descriptors / Indicators Dull;Aching   Aggravating Factors  Reaching, laying on Rt side more than 2 hrs    Pain Relieving Factors avoiding activities that irritate the shoulder.             [facility] PT Assessment - 04/18/17 0001      Assessment   Medical Diagnosis Rt shoulder pain; dysfunction    Referring Provider Dr. Georgina Snell   Onset Date/Surgical Date 03/29/16   Hand Dominance Right   Next MD Visit 05/30/17   Prior Therapy none for shoulder      AROM   Right Shoulder Extension 45 Degrees   Right Shoulder Flexion 141 Degrees  with pain at end range; standing   Right  Shoulder ABduction 137 Degrees   Right Shoulder External Rotation 52 Degrees  after stretching; 36 deg prior to stretches (supine)   Left Shoulder Flexion 145 Degrees         [facility] Adult PT Treatment/Exercise - 04/18/17 0001      Self-Care   Self-Care Other Self-Care Comments   Other Self-Care Comments  Pt educated on safety, application, and use of TENS unit.  Pt verbalized understanding and returned demo.      Shoulder Exercises: Supine   External Rotation AAROM;Both;10 reps  cane; pain at end range   Other Supine Exercises Rt arm snow angels to tolerance (~90deg) x 10 reps.   Elbow press (both hands behind head) x 5 sec hold x 10 reps     Shoulder Exercises: Standing   External Rotation Strengthening;Both;10 reps;Theraband  2 sets   Theraband Level (Shoulder External Rotation) Level 1 (Yellow)   Extension Strengthening;Both;10 reps;Theraband  2 sets   Theraband Level (Shoulder Extension) Level 2 (Red)   Row Strengthening;Both;10 reps;Theraband  2 sets   Theraband Level (Shoulder Row) Level 2 (Red)  tactile cues for posture and technique   Other Standing Exercises W's with shallow  knee bends x 10     Shoulder Exercises: Pulleys   Flexion --  10 reps, 10 sec    ABduction --  10 reps, 10 sec      Shoulder Exercises: Therapy Ball   Other Therapy Ball Exercises pressing down on large ball scapular depression 5 sec x 10      Shoulder Exercises: ROM/Strengthening   UBE (Upper Arm Bike) L1: 45 sec each direction      Moist Heat Therapy   Number Minutes Moist Heat 15 Minutes   Moist Heat Location Shoulder  Rt     Electrical Stimulation   Electrical Stimulation Location Rt shoulder    Electrical Stimulation Action IFC   Electrical Stimulation Parameters to tolerance   Electrical Stimulation Goals Pain         PT Long Term Goals - 04/18/17 0723      PT LONG TERM GOAL #1   Title Improve posture and alignment with patient to demonstrate improved upright posture with  improve position of scapulae along the thoracic wall 05/18/17   Time 6   Period Weeks   Status On-going     PT LONG TERM GOAL #2   Title Increase AROM Rt shoulder to equal or greater than AROM Lt shoudler 05/18/17   Time 6   Period Weeks   Status On-going     PT LONG TERM GOAL #3   Title 5/5 strength without pain with resistive testing Rt shoulder 05/18/17   Time 6   Period Weeks   Status On-going     PT LONG TERM GOAL #4   Title Return to normal functional activities with Rt UE without pain 05/18/17   Time 6   Period Weeks   Status On-going     PT LONG TERM GOAL #5   Title Independent in HEP 05/18/17   Time 6   Period Weeks   Status On-going     PT LONG TERM GOAL #6   Title Improve FOTO to </= 33% limitation 05/18/17   Time 6   Period Weeks   Status On-going               Plan - 04/18/17 0746    Clinical Impression Statement Pt demonstrated improved Rt shoulder flexion/abdct; continues with limted ER.   she verbalized understanding of precautions and use of TENS unit.  Progressing towards goals.  Pt will benefit from continued PT intervention to maximize functional mobility.    Rehab Potential Good   PT Frequency 2x / week   PT Duration 6 weeks   PT Treatment/Interventions Patient/family education;ADLs/Self Care Home Management;Neuromuscular re-education;Cryotherapy;Electrical Stimulation;Iontophoresis 4mg /ml Dexamethasone;Moist Heat;Ultrasound;Dry needling;Manual techniques   PT Next Visit Plan Continue progressive Rt shoulder ROM/strenghtening, neuro-re-ed of post shoulder girdle, manual work to Rt shoulder.    Consulted and Agree with Plan of Care Patient      Patient will benefit from skilled therapeutic intervention in order to improve the following deficits and impairments:  Improper body mechanics, Postural dysfunction, Decreased range of motion, Decreased mobility, Decreased strength, Impaired UE functional use, Decreased activity tolerance  Visit  Diagnosis: Chronic right shoulder pain  Abnormal posture  Muscle weakness (generalized)     Problem List Patient Active Problem List   Diagnosis Date Noted  . DJD of right AC (acromioclavicular) joint 04/01/2017  . Rotator cuff tendonitis, right 04/01/2017  . Fatty liver 03/22/2016  . CKD (chronic kidney disease) stage 3, GFR 30-59 ml/min 03/16/2016  . FATIGUE 12/16/2009  . Hypothyroidism  11/01/2008  . Obesity 08/27/2008  . SLEEP DISORDER 01/17/2008  . Diabetes mellitus without complication ([place]) 84/73/0856  . HYPERLIPIDEMIA 09/14/2006  . Essential hypertension 09/14/2006   Kerin Perna, PTA 04/18/17 9:09 AM  [place], [place], [address] Phone: [phone]   Fax:  [phone]  Name: Emma Craig MRN: [id number] Date of Birth: September 04, 1951

## 2017-04-20 ENCOUNTER — Encounter: Payer: Self-pay | Admitting: Rehabilitative and Restorative Service Providers"

## 2017-04-20 ENCOUNTER — Ambulatory Visit (INDEPENDENT_AMBULATORY_CARE_PROVIDER_SITE_OTHER): Admitting: Rehabilitative and Restorative Service Providers"

## 2017-04-20 DIAGNOSIS — M25511 Pain in right shoulder: Secondary | ICD-10-CM | POA: Diagnosis not present

## 2017-04-20 DIAGNOSIS — G8929 Other chronic pain: Secondary | ICD-10-CM

## 2017-04-20 DIAGNOSIS — R293 Abnormal posture: Secondary | ICD-10-CM | POA: Diagnosis not present

## 2017-04-20 DIAGNOSIS — M6281 Muscle weakness (generalized): Secondary | ICD-10-CM

## 2017-04-20 NOTE — Patient Instructions (Signed)
ELBOW: Biceps - Standing    Standing in doorway, place one hand on wall, elbow straight. step forward keeping weight on legs not arm. Hold __30_ seconds. _3__ reps per set, __2-3 times per day

## 2017-04-20 NOTE — Therapy (Signed)
[place], [place], [address] Phone: [phone]   Fax:  [phone]  Physical Therapy Treatment  Patient Details  Name: Emma Craig MRN: [id number] Date of Birth: June 11, 1951 Referring Provider: Dr. Georgina Snell  Encounter Date: 04/20/2017      PT End of Session - 04/20/17 0718    Visit Number 6   Number of Visits 12   Date for PT Re-Evaluation 05/18/17   PT Start Time 0715   PT Stop Time 0809   PT Time Calculation (min) 54 min   Activity Tolerance Patient tolerated treatment well      Past Medical History:  Diagnosis Date  . Chronic kidney disease, stage 2, mildly decreased GFR   . Diabetes mellitus   . Heavy menses   . Hyperlipidemia   . Hypertension   . Obesity     Past Surgical History:  Procedure Laterality Date  . CATARACT EXTRACTION  1-09   right   . CATARACT EXTRACTION  09/2011   left   . CHOLECYSTECTOMY     laproscopic  . rt elbow screws placed  10-82    There were no vitals filed for this visit.      Subjective Assessment - 04/20/17 0719    Subjective Continued improvement overall. Has increased pain in the anterior Rt shoulder this morning - not sure why.    Currently in Pain? Yes   Pain Score 4    Pain Location Shoulder   Pain Orientation Right   Pain Descriptors / Indicators Dull;Aching   Pain Type Chronic pain   Pain Onset More than a month ago   Pain Frequency Constant  constant with movement                         [facility] Adult PT Treatment/Exercise - 04/20/17 0001      Shoulder Exercises: Supine   Other Supine Exercises prolonged snow angel stretch 2 min bilat arms at ~70-75 deg      Shoulder Exercises: Standing   External Rotation Strengthening;Both;10 reps;Theraband  2 sets   Theraband Level (Shoulder External Rotation) Level 1 (Yellow)   Extension Strengthening;Both;Theraband;20 reps;10 reps  2 sets   Theraband Level (Shoulder  Extension) Level 3 (Green)   Row Strengthening;Both;Theraband;10 reps  2 sets   Theraband Level (Shoulder Row) Level 3 (Green)   Retraction Strengthening;Both;Theraband;20 reps   Theraband Level (Shoulder Retraction) Level 1 (Yellow)   Other Standing Exercises W's with shallow knee bends x 10     Shoulder Exercises: Pulleys   Flexion --  10 reps, 10 sec    ABduction --  10 reps, 10 sec      Shoulder Exercises: Stretch   Corner Stretch --  3 way doorway stretch 30 sec x 3 reps each    Other Shoulder Stretches biceps stretch at table 30 sec x 3      Moist Heat Therapy   Number Minutes Moist Heat 20 Minutes   Moist Heat Location Shoulder  Rt     Electrical Stimulation   Electrical Stimulation Location Rt shoulder    Electrical Stimulation Action IFC   Electrical Stimulation Parameters to tolerance   Electrical Stimulation Goals Pain;Tone     Manual Therapy   Manual therapy comments pt supine    Joint Mobilization Rt GH joint - mobs clavical/first rib    Soft tissue mobilization Rt shoulder girdle - pecs; upper trap; leveator; deltiod; teres; biceps; working through  the clavicular area into first rib and lateral cervical musculature    Myofascial Release Rt pec release    Scapular Mobilization RT   Passive ROM Rt shoulder abd/flex/ ER                 PT Education - 04/20/17 0739    Education provided Yes   Education Details HEP    Person(s) Educated Patient   Methods Explanation;Demonstration;Tactile cues;Verbal cues;Handout   Comprehension Verbalized understanding;Returned demonstration;Verbal cues required;Tactile cues required             PT Long Term Goals - 04/18/17 0723      PT LONG TERM GOAL #1   Title Improve posture and alignment with patient to demonstrate improved upright posture with improve position of scapulae along the thoracic wall 05/18/17   Time 6   Period Weeks   Status On-going     PT LONG TERM GOAL #2   Title Increase AROM Rt  shoulder to equal or greater than AROM Lt shoudler 05/18/17   Time 6   Period Weeks   Status On-going     PT LONG TERM GOAL #3   Title 5/5 strength without pain with resistive testing Rt shoulder 05/18/17   Time 6   Period Weeks   Status On-going     PT LONG TERM GOAL #4   Title Return to normal functional activities with Rt UE without pain 05/18/17   Time 6   Period Weeks   Status On-going     PT LONG TERM GOAL #5   Title Independent in HEP 05/18/17   Time 6   Period Weeks   Status On-going     PT LONG TERM GOAL #6   Title Improve FOTO to </= 33% limitation 05/18/17   Time 6   Period Weeks   Status On-going               Plan - 04/20/17 0720    Clinical Impression Statement Continued progress with Rt shoulder rehab. She has improved mobility and functioin but continues to have pain and tightness through the anterior shoulder. She continues to have muscular tightness through the pecs and biceps as well as the area of the clavical and first/second ribs.    Rehab Potential Good   PT Frequency 2x / week   PT Duration 6 weeks   PT Treatment/Interventions Patient/family education;ADLs/Self Care Home Management;Neuromuscular re-education;Cryotherapy;Electrical Stimulation;Iontophoresis 4mg /ml Dexamethasone;Moist Heat;Ultrasound;Dry needling;Manual techniques   PT Next Visit Plan Continue progressive Rt shoulder ROM/strenghtening, neuro-re-ed of post shoulder girdle, manual work to Rt shoulder.    Consulted and Agree with Plan of Care Patient      Patient will benefit from skilled therapeutic intervention in order to improve the following deficits and impairments:  Improper body mechanics, Postural dysfunction, Decreased range of motion, Decreased mobility, Decreased strength, Impaired UE functional use, Decreased activity tolerance  Visit Diagnosis: Chronic right shoulder pain  Abnormal posture  Muscle weakness (generalized)     Problem List Patient Active Problem  List   Diagnosis Date Noted  . DJD of right AC (acromioclavicular) joint 04/01/2017  . Rotator cuff tendonitis, right 04/01/2017  . Fatty liver 03/22/2016  . CKD (chronic kidney disease) stage 3, GFR 30-59 ml/min 03/16/2016  . FATIGUE 12/16/2009  . Hypothyroidism 11/01/2008  . Obesity 08/27/2008  . SLEEP DISORDER 01/17/2008  . Diabetes mellitus without complication ([place]) 56/21/3086  . HYPERLIPIDEMIA 09/14/2006  . Essential hypertension 09/14/2006    Celyn Nilda Simmer PT, MPH  04/20/2017, 7:58 AM  [facility] [place], [place], [address] Phone: [phone]   Fax:  [phone]  Name: Terria Deschepper MRN: [id number] Date of Birth: 1951/08/01

## 2017-04-26 ENCOUNTER — Ambulatory Visit (INDEPENDENT_AMBULATORY_CARE_PROVIDER_SITE_OTHER): Admitting: Rehabilitative and Restorative Service Providers"

## 2017-04-26 DIAGNOSIS — M25511 Pain in right shoulder: Secondary | ICD-10-CM

## 2017-04-26 DIAGNOSIS — R293 Abnormal posture: Secondary | ICD-10-CM

## 2017-04-26 DIAGNOSIS — M6281 Muscle weakness (generalized): Secondary | ICD-10-CM | POA: Diagnosis not present

## 2017-04-26 DIAGNOSIS — G8929 Other chronic pain: Secondary | ICD-10-CM

## 2017-04-26 NOTE — Therapy (Signed)
[place], [place], [address] Phone: [phone]   Fax:  [phone]  Physical Therapy Treatment  Patient Details  Name: Emma Craig MRN: [id number] Date of Birth: 02/25/1951 Referring Provider: Dr Georgina Snell   Encounter Date: 04/26/2017      PT End of Session - 04/26/17 0804    Visit Number 7   Number of Visits 12   Date for PT Re-Evaluation 05/18/17   PT Start Time 0800   PT Stop Time 0855   PT Time Calculation (min) 55 min   Activity Tolerance Patient tolerated treatment well      Past Medical History:  Diagnosis Date  . Chronic kidney disease, stage 2, mildly decreased GFR   . Diabetes mellitus   . Heavy menses   . Hyperlipidemia   . Hypertension   . Obesity     Past Surgical History:  Procedure Laterality Date  . CATARACT EXTRACTION  1-09   right   . CATARACT EXTRACTION  09/2011   left   . CHOLECYSTECTOMY     laproscopic  . rt elbow screws placed  10-82    There were no vitals filed for this visit.      Subjective Assessment - 04/26/17 0807    Subjective Patient reports that she continues to improve. Seh was able to lie onto the Rt side some last night. It hurt but felt good to be on that Rt side.    Currently in Pain? Yes   Pain Score 2    Pain Location Shoulder   Pain Orientation Right   Pain Descriptors / Indicators Dull;Aching   Pain Type Chronic pain   Pain Onset More than a month ago            [facility] PT Assessment - 04/26/17 0001      Assessment   Medical Diagnosis Rt shoulder pain; dysfunction    Referring Provider Dr Georgina Snell    Onset Date/Surgical Date 03/29/16   Hand Dominance Right   Next MD Visit 05/30/17   Prior Therapy none for shoulder      AROM   Right Shoulder Extension 52 Degrees   Right Shoulder Flexion 150 Degrees   Right Shoulder ABduction 151 Degrees   Right Shoulder External Rotation 67 Degrees                     [facility] Adult  PT Treatment/Exercise - 04/26/17 0001      Shoulder Exercises: Supine   Other Supine Exercises prolonged snow angel stretch 2 min bilat arms at ~70-75 deg    Other Supine Exercises hands behind head - stretch ~ 1 min      Shoulder Exercises: Standing   External Rotation Strengthening;Both;10 reps;Theraband  2 sets   Theraband Level (Shoulder External Rotation) Level 1 (Yellow)   Extension Strengthening;Both;Theraband;20 reps;10 reps  2 sets   Theraband Level (Shoulder Extension) Level 3 (Green)   Row Strengthening;Both;Theraband;10 reps  2 sets   Theraband Level (Shoulder Row) Level 3 (Green)   Retraction Strengthening;Both;Theraband;20 reps   Theraband Level (Shoulder Retraction) Level 1 (Yellow)   Other Standing Exercises W's with shallow knee bends x 10     Shoulder Exercises: Stretch   Corner Stretch --  3 way doorway stretch 30 sec x 3 reps each    Other Shoulder Stretches biceps stretch at table 30 sec x 3      Shoulder Exercises: Body Blade   Flexion 30 seconds;3 reps  Moist Heat Therapy   Number Minutes Moist Heat 20 Minutes   Moist Heat Location Shoulder  Rt     Electrical Stimulation   Electrical Stimulation Location Rt shoulder    Electrical Stimulation Action IFC   Electrical Stimulation Parameters to tolerance   Electrical Stimulation Goals Pain;Tone     Manual Therapy   Manual therapy comments pt supine    Joint Mobilization Rt GH joint - mobs clavical/first rib    Soft tissue mobilization Rt shoulder girdle - pecs; upper trap; leveator; deltiod; teres; biceps; working through the clavicular area into first rib and lateral cervical musculature    Myofascial Release Rt pec release    Scapular Mobilization RT   Passive ROM Rt shoulder abd/flex/ ER                      PT Long Term Goals - 04/26/17 0804      PT LONG TERM GOAL #1   Title Improve posture and alignment with patient to demonstrate improved upright posture with improve  position of scapulae along the thoracic wall 05/18/17   Time 6   Period Weeks   Status On-going     PT LONG TERM GOAL #2   Title Increase AROM Rt shoulder to equal or greater than AROM Lt shoudler 05/18/17   Time 6   Period Weeks   Status On-going     PT LONG TERM GOAL #3   Title 5/5 strength without pain with resistive testing Rt shoulder 05/18/17   Time 6   Period Weeks   Status On-going     PT LONG TERM GOAL #4   Title Return to normal functional activities with Rt UE without pain 05/18/17   Time 6   Period Weeks   Status On-going     PT LONG TERM GOAL #5   Title Independent in HEP 05/18/17   Time 6   Period Weeks   Status On-going     PT LONG TERM GOAL #6   Title Improve FOTO to </= 33% limitation 05/18/17   Time 6   Period Weeks   Status On-going               Plan - 04/26/17 0805    Clinical Impression Statement Continued improvement. Patient reports less pain and demonstrates improved ROM. She continues to demonstrate rounded posture with increased thoracic kyphosis. Posterior shoudler girdle strengthening continues. Progressing well toward stated goals of therapy.    Rehab Potential Good   PT Frequency 2x / week   PT Duration 6 weeks   PT Treatment/Interventions Patient/family education;ADLs/Self Care Home Management;Neuromuscular re-education;Cryotherapy;Electrical Stimulation;Iontophoresis 4mg /ml Dexamethasone;Moist Heat;Ultrasound;Dry needling;Manual techniques   PT Next Visit Plan Continue progressive Rt shoulder ROM/strenghtening, neuro-re-ed of post shoulder girdle, manual work to Rt shoulder.    Consulted and Agree with Plan of Care Patient      Patient will benefit from skilled therapeutic intervention in order to improve the following deficits and impairments:  Improper body mechanics, Postural dysfunction, Decreased range of motion, Decreased mobility, Decreased strength, Impaired UE functional use, Decreased activity tolerance  Visit  Diagnosis: Chronic right shoulder pain  Abnormal posture  Muscle weakness (generalized)     Problem List Patient Active Problem List   Diagnosis Date Noted  . DJD of right AC (acromioclavicular) joint 04/01/2017  . Rotator cuff tendonitis, right 04/01/2017  . Fatty liver 03/22/2016  . CKD (chronic kidney disease) stage 3, GFR 30-59 ml/min 03/16/2016  . FATIGUE 12/16/2009  .  Hypothyroidism 11/01/2008  . Obesity 08/27/2008  . SLEEP DISORDER 01/17/2008  . Diabetes mellitus without complication ([place]) 61/60/7371  . HYPERLIPIDEMIA 09/14/2006  . Essential hypertension 09/14/2006    Kerri Asche Nilda Simmer PT, MPH  04/26/2017, 8:46 AM  [facility] [place], [place], [address] Phone: [phone]   Fax:  [phone]  Name: Emma Craig MRN: [id number] Date of Birth: Jan 31, 1951

## 2017-04-28 ENCOUNTER — Encounter: Payer: Self-pay | Admitting: Rehabilitative and Restorative Service Providers"

## 2017-04-28 ENCOUNTER — Ambulatory Visit (INDEPENDENT_AMBULATORY_CARE_PROVIDER_SITE_OTHER): Admitting: Rehabilitative and Restorative Service Providers"

## 2017-04-28 DIAGNOSIS — R293 Abnormal posture: Secondary | ICD-10-CM

## 2017-04-28 DIAGNOSIS — M25511 Pain in right shoulder: Secondary | ICD-10-CM | POA: Diagnosis not present

## 2017-04-28 DIAGNOSIS — M6281 Muscle weakness (generalized): Secondary | ICD-10-CM

## 2017-04-28 DIAGNOSIS — G8929 Other chronic pain: Secondary | ICD-10-CM

## 2017-04-28 NOTE — Therapy (Signed)
[place], [place], [address] Phone: [phone]   Fax:  [phone]  Physical Therapy Treatment  Patient Details  Name: Emma Craig MRN: [id number] Date of Birth: [birth year] Referring Provider: Dr Georgina Snell   Encounter Date: 04/28/2017      PT End of Session - 04/28/17 0808    Visit Number 8   Number of Visits 12   Date for PT Re-Evaluation 05/18/17   PT Start Time 0802   PT Stop Time 5993   PT Time Calculation (min) 55 min   Activity Tolerance Patient tolerated treatment well      Past Medical History:  Diagnosis Date  . Chronic kidney disease, stage 2, mildly decreased GFR   . Diabetes mellitus   . Heavy menses   . Hyperlipidemia   . Hypertension   . Obesity     Past Surgical History:  Procedure Laterality Date  . CATARACT EXTRACTION  1-09   right   . CATARACT EXTRACTION  09/2011   left   . CHOLECYSTECTOMY     laproscopic  . rt elbow screws placed  10-82    There were no vitals filed for this visit.      Subjective Assessment - 04/28/17 0809    Subjective Patient reports that she continues to improve. Playing in the swimming pool with her grandson yesterday.    Currently in Pain? Yes   Pain Score 2    Pain Location Shoulder   Pain Orientation Right   Pain Descriptors / Indicators Dull;Aching                         [facility] Adult PT Treatment/Exercise - 04/28/17 0001      Shoulder Exercises: Standing   External Rotation Strengthening;Both;10 reps;Theraband  2 sets   Theraband Level (Shoulder External Rotation) Level 1 (Yellow)   Extension Strengthening;Both;Theraband;20 reps;10 reps  2 sets   Theraband Level (Shoulder Extension) Level 3 (Green)   Extension Limitations active extension w/ cane x 10 reps    Row Strengthening;Both;Theraband;10 reps  2 sets   Theraband Level (Shoulder Row) Level 3 (Green)   Row Limitations step back for single arm row  yellow TB x 10 each side    Retraction Strengthening;Both;Theraband;20 reps   Theraband Level (Shoulder Retraction) Level 1 (Yellow)   Other Standing Exercises W's with shallow knee bends x 10     Shoulder Exercises: Pulleys   Flexion --  10 sec x 10    ABduction --  10 sec x 10      Shoulder Exercises: Therapy Ball   Other Therapy Ball Exercises pressing down on large ball scapular depression 5 sec x 10    Other Therapy Ball Exercises rythmic stabilization hand on large green ball 30-45 sec x 3      Shoulder Exercises: Isometric Strengthening   External Rotation Theraband  10 reps 1-2 sec    Theraband Level (External Rotation) Level 2 (Red)     Shoulder Exercises: IT sales professional --  3 way doorway stretch 30 sec x 3 reps each    Internal Rotation Stretch 5 reps  10 sec hold with strap   Internal Rotation Stretch Limitations slide hands up back w/cane - hands close together x 10    Other Shoulder Stretches biceps stretch at table 30 sec x 3      Moist Heat Therapy   Number Minutes Moist Heat 20 Minutes  Moist Heat Location Shoulder  Rt     Electrical Stimulation   Electrical Stimulation Location Rt shoulder    Electrical Stimulation Action IFC   Electrical Stimulation Parameters to tolerance   Electrical Stimulation Goals Pain;Tone     Manual Therapy   Manual therapy comments pt supine    Joint Mobilization Rt GH joint - mobs clavical/first rib    Soft tissue mobilization Rt shoulder girdle - pecs; upper trap; leveator; deltiod; teres; biceps; working through the clavicular area into first rib and lateral cervical musculature    Myofascial Release Rt pec release    Scapular Mobilization RT   Passive ROM Rt shoulder abd/flex/ ER                      PT Long Term Goals - 04/26/17 0804      PT LONG TERM GOAL #1   Title Improve posture and alignment with patient to demonstrate improved upright posture with improve position of scapulae along the  thoracic wall 05/18/17   Time 6   Period Weeks   Status On-going     PT LONG TERM GOAL #2   Title Increase AROM Rt shoulder to equal or greater than AROM Lt shoudler 05/18/17   Time 6   Period Weeks   Status On-going     PT LONG TERM GOAL #3   Title 5/5 strength without pain with resistive testing Rt shoulder 05/18/17   Time 6   Period Weeks   Status On-going     PT LONG TERM GOAL #4   Title Return to normal functional activities with Rt UE without pain 05/18/17   Time 6   Period Weeks   Status On-going     PT LONG TERM GOAL #5   Title Independent in HEP 05/18/17   Time 6   Period Weeks   Status On-going     PT LONG TERM GOAL #6   Title Improve FOTO to </= 33% limitation 05/18/17   Time 6   Period Weeks   Status On-going               Plan - 04/28/17 0810    Clinical Impression Statement Greadual imporovement continues. Patient demonstrates increasing ROM and reports improved function. Progressing well toward stated goals of therapy.    Rehab Potential Good   PT Frequency 2x / week   PT Duration 6 weeks   PT Treatment/Interventions Patient/family education;ADLs/Self Care Home Management;Neuromuscular re-education;Cryotherapy;Electrical Stimulation;Iontophoresis 4mg /ml Dexamethasone;Moist Heat;Ultrasound;Dry needling;Manual techniques   PT Next Visit Plan Continue progressive Rt shoulder ROM/strenghtening, neuro-re-ed of post shoulder girdle, manual work to Rt shoulder.    Consulted and Agree with Plan of Care Patient      Patient will benefit from skilled therapeutic intervention in order to improve the following deficits and impairments:  Improper body mechanics, Postural dysfunction, Decreased range of motion, Decreased mobility, Decreased strength, Impaired UE functional use, Decreased activity tolerance  Visit Diagnosis: Chronic right shoulder pain  Abnormal posture  Muscle weakness (generalized)     Problem List Patient Active Problem List    Diagnosis Date Noted  . DJD of right AC (acromioclavicular) joint 04/01/2017  . Rotator cuff tendonitis, right 04/01/2017  . Fatty liver 03/22/2016  . CKD (chronic kidney disease) stage 3, GFR 30-59 ml/min 03/16/2016  . FATIGUE 12/16/2009  . Hypothyroidism 11/01/2008  . Obesity 08/27/2008  . SLEEP DISORDER 01/17/2008  . Diabetes mellitus without complication ([place]) 51/76/1607  . HYPERLIPIDEMIA 09/14/2006  .  Essential hypertension 09/14/2006    Latreshia Beauchaine Nilda Simmer PT, MPH  04/28/2017, 8:47 AM  [facility] [place], [place], [address] Phone: [phone]   Fax:  [phone]  Name: Emma Craig MRN: [id number] Date of Birth: [birth year]

## 2017-05-02 ENCOUNTER — Ambulatory Visit (INDEPENDENT_AMBULATORY_CARE_PROVIDER_SITE_OTHER): Admitting: Rehabilitative and Restorative Service Providers"

## 2017-05-02 ENCOUNTER — Encounter: Payer: Self-pay | Admitting: Rehabilitative and Restorative Service Providers"

## 2017-05-02 DIAGNOSIS — M6281 Muscle weakness (generalized): Secondary | ICD-10-CM | POA: Diagnosis not present

## 2017-05-02 DIAGNOSIS — G8929 Other chronic pain: Secondary | ICD-10-CM

## 2017-05-02 DIAGNOSIS — R293 Abnormal posture: Secondary | ICD-10-CM | POA: Diagnosis not present

## 2017-05-02 DIAGNOSIS — M25511 Pain in right shoulder: Secondary | ICD-10-CM | POA: Diagnosis not present

## 2017-05-02 NOTE — Patient Instructions (Signed)
Lower Trunk Rotation Stretch    Keeping back flat and feet together, rotate knees to left side. Hold __30-60__ seconds. Repeat __2-3__ times per set.  Do __2__ sessions per day.   Scapular Retraction: Rowing (Eccentric) - Arms - 45 Degrees (Resistance Band)    Hold end of band in each hand. Pull back until elbows are even with trunk. Keep elbows out from sides at 45, thumbs up. Slowly release for 3-5 seconds. Use _____yellow or red___ resistance band. _10__ reps per set, _2-3__ sets per day

## 2017-05-02 NOTE — Therapy (Signed)
[place], [place], [address] Phone: [phone]   Fax:  [phone]  Physical Therapy Treatment  Patient Details  Name: Emma Craig MRN: [id number] Date of Birth: 06/22/1951 Referring Provider: Dr Georgina Snell   Encounter Date: 05/02/2017      PT End of Session - 05/02/17 0805    Visit Number 9   Number of Visits 12   Date for PT Re-Evaluation 05/18/17   PT Start Time 0800   PT Stop Time 0856   PT Time Calculation (min) 56 min   Activity Tolerance Patient tolerated treatment well      Past Medical History:  Diagnosis Date  . Chronic kidney disease, stage 2, mildly decreased GFR   . Diabetes mellitus   . Heavy menses   . Hyperlipidemia   . Hypertension   . Obesity     Past Surgical History:  Procedure Laterality Date  . CATARACT EXTRACTION  1-09   right   . CATARACT EXTRACTION  09/2011   left   . CHOLECYSTECTOMY     laproscopic  . rt elbow screws placed  10-82    There were no vitals filed for this visit.      Subjective Assessment - 05/02/17 0805    Subjective Patient reports that she continues to improve. Tossed a pair of socks overhead to her husband with no pain. Sleeping on the Rt side more now. Not at 100% yet but making progress. Working on her exercises at home.    Currently in Pain? Yes   Pain Score 2    Pain Location Shoulder   Pain Orientation Right   Pain Descriptors / Indicators Dull   Pain Type Chronic pain   Pain Onset More than a month ago   Pain Frequency Intermittent   Aggravating Factors  reaching; lying on the Rt side 3-4 hours; holding arm out to front   Pain Relieving Factors avoiding activities that irritate shoulder                         [facility] Adult PT Treatment/Exercise - 05/02/17 0001      Shoulder Exercises: Supine   Other Supine Exercises prolonged snow angel stretch 2 min bilat arms at ~70-75 deg    Other Supine Exercises trunk  rotation in snow angel position 30 sec x 3      Shoulder Exercises: Standing   Extension Strengthening;Both;Theraband;20 reps;10 reps  2 sets   Theraband Level (Shoulder Extension) Level 3 (Green)   Extension Limitations active extension w/ cane x 10 reps    Row Strengthening;Both;Theraband;10 reps  2 sets   Theraband Level (Shoulder Row) Level 3 (Green)   Row Weight (lbs) row at ~35 deg abd red TB x 10    Row Limitations step back for single arm row red TB x 10 each side    Retraction Strengthening;Both;Theraband;20 reps   Theraband Level (Shoulder Retraction) Level 1 (Yellow)   Other Standing Exercises W's with shallow knee bends x 10     Shoulder Exercises: Pulleys   Flexion --  10 sec x 10    ABduction --  10 sec x 10      Shoulder Exercises: Therapy Ball   Other Therapy Ball Exercises pressing down on large ball scapular depression 5 sec x 10    Other Therapy Ball Exercises rythmic stabilization hand on large green ball 30-45 sec x 3      Shoulder Exercises:  IT sales professional --  3 way doorway stretch 30 sec x 3 reps each    Internal Rotation Stretch 3 reps  standing with strap    Other Shoulder Stretches biceps stretch at table 30 sec x 3      Shoulder Exercises: Body Blade   Flexion 30 seconds;3 reps     Moist Heat Therapy   Number Minutes Moist Heat 20 Minutes   Moist Heat Location Shoulder  Rt     Electrical Stimulation   Electrical Stimulation Location Rt shoulder    Electrical Stimulation Action IFC   Electrical Stimulation Parameters to tolerance   Electrical Stimulation Goals Pain;Tone     Manual Therapy   Manual therapy comments pt supine    Joint Mobilization Rt GH joint - mobs clavical/first rib    Soft tissue mobilization Rt shoulder girdle - pecs; upper trap; leveator; deltiod; teres; biceps; working through the clavicular area into first rib and lateral cervical musculature    Myofascial Release Rt pec release    Scapular Mobilization RT    Passive ROM Rt shoulder abd/flex/ ER                 PT Education - 05/02/17 0853    Education provided Yes   Education Details HEP    Person(s) Educated Patient   Methods Explanation;Demonstration;Tactile cues;Verbal cues;Handout   Comprehension Verbalized understanding;Returned demonstration;Verbal cues required;Tactile cues required             PT Long Term Goals - 05/02/17 0809      PT LONG TERM GOAL #1   Title Improve posture and alignment with patient to demonstrate improved upright posture with improve position of scapulae along the thoracic wall 05/18/17   Time 6   Period Weeks   Status On-going     PT LONG TERM GOAL #2   Title Increase AROM Rt shoulder to equal or greater than AROM Lt shoudler 05/18/17   Time 6   Period Weeks   Status On-going     PT LONG TERM GOAL #3   Title 5/5 strength without pain with resistive testing Rt shoulder 05/18/17   Time 6   Period Weeks   Status On-going     PT LONG TERM GOAL #4   Title Return to normal functional activities with Rt UE without pain 05/18/17   Time 6   Period Weeks   Status On-going     PT LONG TERM GOAL #5   Title Independent in HEP 05/18/17   Time 6   Period Weeks   Status On-going     PT LONG TERM GOAL #6   Title Improve FOTO to </= 33% limitation 05/18/17   Time 6   Period Weeks   Status On-going               Plan - 05/02/17 6389    Clinical Impression Statement Cotninued improvement. Increased ROM and strength; decreased pain. Progresing well toward stated goals of therapy.    Rehab Potential Good   PT Frequency 2x / week   PT Duration 6 weeks   PT Treatment/Interventions Patient/family education;ADLs/Self Care Home Management;Neuromuscular re-education;Cryotherapy;Electrical Stimulation;Iontophoresis 4mg /ml Dexamethasone;Moist Heat;Ultrasound;Dry needling;Manual techniques   PT Next Visit Plan Continue progressive Rt shoulder ROM/strenghtening, neuro-re-ed of post shoulder  girdle, manual work to Rt shoulder.    Consulted and Agree with Plan of Care Patient      Patient will benefit from skilled therapeutic intervention in order to improve the following  deficits and impairments:  Improper body mechanics, Postural dysfunction, Decreased range of motion, Decreased mobility, Decreased strength, Impaired UE functional use, Decreased activity tolerance  Visit Diagnosis: Chronic right shoulder pain  Abnormal posture  Muscle weakness (generalized)     Problem List Patient Active Problem List   Diagnosis Date Noted  . DJD of right AC (acromioclavicular) joint 04/01/2017  . Rotator cuff tendonitis, right 04/01/2017  . Fatty liver 03/22/2016  . CKD (chronic kidney disease) stage 3, GFR 30-59 ml/min 03/16/2016  . FATIGUE 12/16/2009  . Hypothyroidism 11/01/2008  . Obesity 08/27/2008  . SLEEP DISORDER 01/17/2008  . Diabetes mellitus without complication ([place]) 17/91/5056  . HYPERLIPIDEMIA 09/14/2006  . Essential hypertension 09/14/2006    Gianfranco Araki Nilda Simmer PT, MPH  05/02/2017, 8:54 AM  [facility] [place], [place], [address] Phone: [phone]   Fax:  [phone]  Name: Marylin Lathon MRN: [id number] Date of Birth: 1951-11-02

## 2017-05-05 ENCOUNTER — Ambulatory Visit (INDEPENDENT_AMBULATORY_CARE_PROVIDER_SITE_OTHER): Admitting: Rehabilitative and Restorative Service Providers"

## 2017-05-05 ENCOUNTER — Encounter: Payer: Self-pay | Admitting: Rehabilitative and Restorative Service Providers"

## 2017-05-05 DIAGNOSIS — M25511 Pain in right shoulder: Secondary | ICD-10-CM

## 2017-05-05 DIAGNOSIS — M6281 Muscle weakness (generalized): Secondary | ICD-10-CM

## 2017-05-05 DIAGNOSIS — G8929 Other chronic pain: Secondary | ICD-10-CM

## 2017-05-05 DIAGNOSIS — R293 Abnormal posture: Secondary | ICD-10-CM

## 2017-05-05 NOTE — Therapy (Signed)
[place], [place], [address] Phone: [phone]   Fax:  [phone]  Physical Therapy Treatment  Patient Details  Name: Emma Craig MRN: [id number] Date of Birth: 09-Dec-1950 Referring Provider: Dr Georgina Snell  Encounter Date: 05/05/2017      PT End of Session - 05/05/17 0805    Visit Number 10   Number of Visits 12   Date for PT Re-Evaluation 05/18/17   PT Start Time 0801   PT Stop Time 4196   PT Time Calculation (min) 56 min   Activity Tolerance Patient tolerated treatment well      Past Medical History:  Diagnosis Date  . Chronic kidney disease, stage 2, mildly decreased GFR   . Diabetes mellitus   . Heavy menses   . Hyperlipidemia   . Hypertension   . Obesity     Past Surgical History:  Procedure Laterality Date  . CATARACT EXTRACTION  1-09   right   . CATARACT EXTRACTION  09/2011   left   . CHOLECYSTECTOMY     laproscopic  . rt elbow screws placed  10-82    There were no vitals filed for this visit.      Subjective Assessment - 05/05/17 0806    Subjective At the swimming pool with her grandson yesterday throwing a ball which made her shoulder "really sore". Some better today.    Currently in Pain? Yes   Pain Score 2    Pain Location Shoulder   Pain Orientation Right   Pain Descriptors / Indicators Dull   Pain Type Chronic pain   Pain Onset More than a month ago   Pain Frequency Intermittent            [facility] PT Assessment - 05/05/17 0001      Assessment   Medical Diagnosis Rt shoulder pain; dysfunction    Referring Provider Dr Georgina Snell   Onset Date/Surgical Date 03/29/16   Hand Dominance Right   Next MD Visit 05/30/17   Prior Therapy none for shoulder      Observation/Other Assessments   Focus on Therapeutic Outcomes (FOTO)  38% limitation      AROM   Right Shoulder Extension 60 Degrees   Right Shoulder Flexion 150 Degrees  "feels it" at end range   Right  Shoulder ABduction 155 Degrees   Right Shoulder External Rotation 78 Degrees     Strength   Right Shoulder Flexion 5/5   Right Shoulder ABduction 5/5   Right Shoulder External Rotation --  5-/5     Palpation   Palpation comment decreasing tightness Rt pecs; upper trap; leveator; teres; deltoid; biceps tendon area                      [facility] Adult PT Treatment/Exercise - 05/05/17 0001      Shoulder Exercises: Standing   External Rotation Strengthening;Both;10 reps;Theraband   Theraband Level (Shoulder External Rotation) Level 1 (Yellow)   Extension Strengthening;Both;Theraband;20 reps;10 reps  2 sets   Theraband Level (Shoulder Extension) Level 3 (Green)   Row Strengthening;Both;Theraband;10 reps  2 sets   Theraband Level (Shoulder Row) Level 3 (Green)   Row Weight (lbs) row at ~45 deg abd red TB x 10    Row Limitations step back for single arm row red TB x 10 each side    Retraction Strengthening;Both;Theraband;20 reps   Theraband Level (Shoulder Retraction) Level 1 (Yellow)   Other Standing Exercises scap squeeze with back  against pool noodle x 10 sec x 10 reps.    Other Standing Exercises W's with shallow knee bends x 10     Shoulder Exercises: Therapy Ball   Other Therapy Ball Exercises pressing down on large ball scapular depression 5 sec x 10    Other Therapy Ball Exercises rythmic stabilization hand on large green ball 30-45 sec x 3      Shoulder Exercises: ROM/Strengthening   UBE (Upper Arm Bike) L2 2 min fwd/1 back 1 fwd      Shoulder Exercises: IT sales professional --  3 way doorway stretch 30 sec x 3 reps each    Internal Rotation Stretch 3 reps  standing with strap    Other Shoulder Stretches biceps stretch at table 30 sec x 3    Other Shoulder Stretches horizontal abduction stretch at 90 deg abd hand on wall 30 sec x 3      Moist Heat Therapy   Number Minutes Moist Heat 20 Minutes   Moist Heat Location Shoulder  Rt     Electrical  Stimulation   Electrical Stimulation Location Rt shoulder    Electrical Stimulation Action IFC   Electrical Stimulation Parameters to tolerance   Electrical Stimulation Goals Pain;Tone     Manual Therapy   Manual therapy comments pt supine    Joint Mobilization Rt GH joint - mobs clavical/first rib    Soft tissue mobilization Rt shoulder girdle - pecs; upper trap; leveator; deltiod; teres; biceps; working through the clavicular area into first rib and lateral cervical musculature    Myofascial Release Rt pecs/biceps   Scapular Mobilization RT   Passive ROM Rt shoulder abd/flex/ ER/extension/horizontal abduction                       PT Long Term Goals - 05/02/17 0809      PT LONG TERM GOAL #1   Title Improve posture and alignment with patient to demonstrate improved upright posture with improve position of scapulae along the thoracic wall 05/18/17   Time 6   Period Weeks   Status On-going     PT LONG TERM GOAL #2   Title Increase AROM Rt shoulder to equal or greater than AROM Lt shoudler 05/18/17   Time 6   Period Weeks   Status On-going     PT LONG TERM GOAL #3   Title 5/5 strength without pain with resistive testing Rt shoulder 05/18/17   Time 6   Period Weeks   Status On-going     PT LONG TERM GOAL #4   Title Return to normal functional activities with Rt UE without pain 05/18/17   Time 6   Period Weeks   Status On-going     PT LONG TERM GOAL #5   Title Independent in HEP 05/18/17   Time 6   Period Weeks   Status On-going     PT LONG TERM GOAL #6   Title Improve FOTO to </= 33% limitation 05/18/17   Time 6   Period Weeks   Status On-going               Plan - 05/05/17 0902    Clinical Impression Statement Excellent progress with increasing ROM and strength Rt UE; improved functional activity reported and decreased pain. Good improvement in FOTO score noted as well. Progressing well toward stated goals of therapy. Will benefit from continued  treatment to reach maximum rehab poteitial.    Rehab Potential  Good   PT Frequency 2x / week   PT Duration 6 weeks   PT Treatment/Interventions Patient/family education;ADLs/Self Care Home Management;Neuromuscular re-education;Cryotherapy;Electrical Stimulation;Iontophoresis 4mg /ml Dexamethasone;Moist Heat;Ultrasound;Dry needling;Manual techniques   PT Next Visit Plan Continue progressive Rt shoulder ROM/strenghtening, neuro-re-ed of post shoulder girdle, manual work to Rt shoulder. Trial of ionto anterior shoudler   Consulted and Agree with Plan of Care Patient      Patient will benefit from skilled therapeutic intervention in order to improve the following deficits and impairments:  Improper body mechanics, Postural dysfunction, Decreased range of motion, Decreased mobility, Decreased strength, Impaired UE functional use, Decreased activity tolerance  Visit Diagnosis: Chronic right shoulder pain  Abnormal posture  Muscle weakness (generalized)       [facility] PT PB G-CODES - 05-20-17 0904    Functional Assessment Tool Used  Evaluation; FOTO; clinical assessment    Functional Limitations Carrying, moving and handling objects   Carrying, Moving and Handling Objects Current Status (J5701) At least 20 percent but less than 40 percent impaired, limited or restricted   Carrying, Moving and Handling Objects Goal Status (X7939) At least 20 percent but less than 40 percent impaired, limited or restricted      Problem List Patient Active Problem List   Diagnosis Date Noted  . DJD of right AC (acromioclavicular) joint 04/01/2017  . Rotator cuff tendonitis, right 04/01/2017  . Fatty liver 03/22/2016  . CKD (chronic kidney disease) stage 3, GFR 30-59 ml/min 03/16/2016  . FATIGUE 12/16/2009  . Hypothyroidism 11/01/2008  . Obesity 08/27/2008  . SLEEP DISORDER 01/17/2008  . Diabetes mellitus without complication ([place]) 03/00/9233  . HYPERLIPIDEMIA 09/14/2006  . Essential hypertension  09/14/2006    Hedaya Latendresse Nilda Simmer PT, MPH  2017-05-20, 9:06 AM  [facility] [place], [place], [address] Phone: [phone]   Fax:  [phone]  Name: Emma Craig MRN: [id number] Date of Birth: 10-Jun-1951

## 2017-05-09 ENCOUNTER — Ambulatory Visit (INDEPENDENT_AMBULATORY_CARE_PROVIDER_SITE_OTHER): Admitting: Rehabilitative and Restorative Service Providers"

## 2017-05-09 ENCOUNTER — Encounter: Payer: Self-pay | Admitting: Rehabilitative and Restorative Service Providers"

## 2017-05-09 DIAGNOSIS — M25511 Pain in right shoulder: Secondary | ICD-10-CM | POA: Diagnosis not present

## 2017-05-09 DIAGNOSIS — M6281 Muscle weakness (generalized): Secondary | ICD-10-CM | POA: Diagnosis not present

## 2017-05-09 DIAGNOSIS — R293 Abnormal posture: Secondary | ICD-10-CM | POA: Diagnosis not present

## 2017-05-09 DIAGNOSIS — G8929 Other chronic pain: Secondary | ICD-10-CM

## 2017-05-09 NOTE — Therapy (Signed)
[place], [place], [address] Phone: [phone]   Fax:  [phone]  Physical Therapy Treatment  Patient Details  Name: Emma Craig MRN: [id number] Date of Birth: August 21, 1951 Referring Provider: Dr Georgina Snell  Encounter Date: 05/09/2017      PT End of Session - 05/09/17 0803    Visit Number 11   Number of Visits 12   Date for PT Re-Evaluation 05/18/17   PT Start Time 0801   PT Stop Time 2947   PT Time Calculation (min) 56 min   Activity Tolerance Patient tolerated treatment well      Past Medical History:  Diagnosis Date  . Chronic kidney disease, stage 2, mildly decreased GFR   . Diabetes mellitus   . Heavy menses   . Hyperlipidemia   . Hypertension   . Obesity     Past Surgical History:  Procedure Laterality Date  . CATARACT EXTRACTION  1-09   right   . CATARACT EXTRACTION  09/2011   left   . CHOLECYSTECTOMY     laproscopic  . rt elbow screws placed  10-82    There were no vitals filed for this visit.      Subjective Assessment - 05/09/17 0803    Subjective Shoulder feels "good". Can still feel it a little bit but it's not painful. She is using Rt UE for most activities at home.    Currently in Pain? No/denies                         [facility] Adult PT Treatment/Exercise - 05/09/17 0001      Shoulder Exercises: Sidelying   Other Sidelying Exercises sleeper stretch 30 sec x 3      Shoulder Exercises: Standing   Extension Strengthening;Both;Theraband;20 reps;10 reps  2 sets   Theraband Level (Shoulder Extension) Level 4 (Blue)   Row Strengthening;Both;Theraband;10 reps  2 sets   Theraband Level (Shoulder Row) Level 4 (Blue)   Row Weight (lbs) row at ~45 deg abd green TB x 20    Row Limitations step back for single arm row green TB x 20 each side    Retraction Strengthening;Both;Theraband;20 reps   Other Standing Exercises W's with shallow knee bends x 10     Shoulder Exercises: Pulleys   Flexion --  10 reps 10 sec hold    ABduction --  10 sec x 10 reps      Shoulder Exercises: Therapy Ball   Other Therapy Ball Exercises pressing down on large ball scapular depression 5 sec x 10    Other Therapy Ball Exercises rythmic stabilization hand on large green ball 30-45 sec x 3      Shoulder Exercises: ROM/Strengthening   UBE (Upper Arm Bike) L2 2 min fwd/1 back 1 fwd      Shoulder Exercises: IT sales professional --  3 way doorway stretch 30 sec x 3 reps each    Internal Rotation Stretch 3 reps  standing with strap    Other Shoulder Stretches biceps stretch at table 30 sec x 3    Other Shoulder Stretches horizontal abduction stretch at 90 deg abd hand on wall 30 sec x 3      Moist Heat Therapy   Number Minutes Moist Heat 20 Minutes   Moist Heat Location Shoulder  Rt     Electrical Stimulation   Electrical Stimulation Location Rt shoulder    Electrical Stimulation Action IFC  Electrical Stimulation Parameters to tolerance   Electrical Stimulation Goals Pain;Tone     Iontophoresis   Type of Iontophoresis Dexamethasone   Location anterior Rt shoulder    Dose 120 mAmp    Time 8 hr     Manual Therapy   Manual therapy comments pt supine    Joint Mobilization Rt GH joint - mobs clavical/first rib    Soft tissue mobilization Rt shoulder girdle - pecs; upper trap; leveator; deltiod; teres; biceps; working through the clavicular area into first rib and lateral cervical musculature    Myofascial Release Rt pecs/biceps   Scapular Mobilization RT   Passive ROM Rt shoulder abd/flex/ ER/extension/horizontal abduction                  PT Education - 05/09/17 0837    Education provided Yes   Education Details HEP    Person(s) Educated Patient   Methods Explanation;Demonstration;Tactile cues;Verbal cues;Handout   Comprehension Verbalized understanding;Returned demonstration;Verbal cues required;Tactile cues required              PT Long Term Goals - 05/09/17 0804      PT LONG TERM GOAL #1   Title Improve posture and alignment with patient to demonstrate improved upright posture with improve position of scapulae along the thoracic wall 05/18/17   Time 6   Period Weeks   Status Partially Met     PT LONG TERM GOAL #2   Title Increase AROM Rt shoulder to equal or greater than AROM Lt shoudler 05/18/17   Time 6   Period Weeks   Status On-going     PT LONG TERM GOAL #3   Title 5/5 strength without pain with resistive testing Rt shoulder 05/18/17   Time 6   Period Weeks   Status Partially Met     PT LONG TERM GOAL #4   Title Return to normal functional activities with Rt UE without pain 05/18/17   Time 6   Period Weeks   Status On-going     PT LONG TERM GOAL #5   Title Independent in HEP 05/18/17   Time 6   Period Weeks   Status On-going     PT LONG TERM GOAL #6   Title Improve FOTO to </= 33% limitation 05/18/17   Time 6   Period Weeks   Status On-going               Plan - 05/09/17 0805    Clinical Impression Statement Progressing well with shoulder rehab with increasing ROM and strength and decreased pain. Functional activity level is improving.    Rehab Potential Good   PT Frequency 2x / week   PT Duration 6 weeks   PT Treatment/Interventions Patient/family education;ADLs/Self Care Home Management;Neuromuscular re-education;Cryotherapy;Electrical Stimulation;Iontophoresis '4mg'$ /ml Dexamethasone;Moist Heat;Ultrasound;Dry needling;Manual techniques   PT Next Visit Plan Continue progressive Rt shoulder ROM/strenghtening, neuro-re-ed of post shoulder girdle, manual work to Rt shoulder. trial of ionto anterior shoudler as indicated       Patient will benefit from skilled therapeutic intervention in order to improve the following deficits and impairments:  Improper body mechanics, Postural dysfunction, Decreased range of motion, Decreased mobility, Decreased strength, Impaired UE functional use,  Decreased activity tolerance  Visit Diagnosis: Chronic right shoulder pain  Abnormal posture  Muscle weakness (generalized)     Problem List Patient Active Problem List   Diagnosis Date Noted  . DJD of right AC (acromioclavicular) joint 04/01/2017  . Rotator cuff tendonitis, right 04/01/2017  . Fatty  liver 03/22/2016  . CKD (chronic kidney disease) stage 3, GFR 30-59 ml/min 03/16/2016  . FATIGUE 12/16/2009  . Hypothyroidism 11/01/2008  . Obesity 08/27/2008  . SLEEP DISORDER 01/17/2008  . Diabetes mellitus without complication ([place]) 66/81/5947  . HYPERLIPIDEMIA 09/14/2006  . Essential hypertension 09/14/2006    Emma Craig Nilda Simmer PT, MPH  05/09/2017, 8:57 AM  [facility] [place], [place], [address] Phone: [phone]   Fax:  [phone]  Name: Emma Craig MRN: [id number] Date of Birth: November 23, 1951

## 2017-05-09 NOTE — Patient Instructions (Signed)
Posterior Capsule Sleeper Stretch, Side-Lying   Place noodle under arm just above elbow  Lie on side, pillow under head, neck in neutral, underside arm in 90-90 of shoulder and elbow flexion with scapula fixed to table. Use other hand to press back of underside arm forward and downward. Keep elbow angle. Hold _30__ seconds.  Repeat _3__ times per session. Do _1__ sessions per day.

## 2017-05-12 ENCOUNTER — Encounter: Payer: Self-pay | Admitting: Rehabilitative and Restorative Service Providers"

## 2017-05-12 ENCOUNTER — Ambulatory Visit (INDEPENDENT_AMBULATORY_CARE_PROVIDER_SITE_OTHER): Admitting: Rehabilitative and Restorative Service Providers"

## 2017-05-12 DIAGNOSIS — M6281 Muscle weakness (generalized): Secondary | ICD-10-CM | POA: Diagnosis not present

## 2017-05-12 DIAGNOSIS — M25511 Pain in right shoulder: Secondary | ICD-10-CM | POA: Diagnosis not present

## 2017-05-12 DIAGNOSIS — G8929 Other chronic pain: Secondary | ICD-10-CM

## 2017-05-12 DIAGNOSIS — R293 Abnormal posture: Secondary | ICD-10-CM | POA: Diagnosis not present

## 2017-05-12 NOTE — Therapy (Addendum)
[place], [place], [address] Phone: [phone]   Fax:  [phone]  Physical Therapy Treatment  Patient Details  Name: Emma Craig MRN: [id number] Date of Birth: 1951/02/13 Referring Provider: Dr Georgina Snell   Encounter Date: 05/12/2017      PT End of Session - 05/12/17 0806    Visit Number 12   Number of Visits 12   Date for PT Re-Evaluation 05/18/17   PT Start Time 0805   PT Stop Time 0902   PT Time Calculation (min) 57 min   Activity Tolerance Patient tolerated treatment well      Past Medical History:  Diagnosis Date  . Chronic kidney disease, stage 2, mildly decreased GFR   . Diabetes mellitus   . Heavy menses   . Hyperlipidemia   . Hypertension   . Obesity     Past Surgical History:  Procedure Laterality Date  . CATARACT EXTRACTION  1-09   right   . CATARACT EXTRACTION  09/2011   left   . CHOLECYSTECTOMY     laproscopic  . rt elbow screws placed  10-82    There were no vitals filed for this visit.      Subjective Assessment - 05/12/17 0807    Subjective Shoulder is feeling "very good". Went to the pool yesterday and had no problems. Can raise her hand up further behind her back. Heading for vacation next week. Feels she will do fine now.    Currently in Pain? No/denies            [facility] PT Assessment - 05/12/17 0001      Assessment   Medical Diagnosis Rt shoulder pain; dysfunction    Referring Provider Dr Georgina Snell    Onset Date/Surgical Date 03/29/16   Hand Dominance Right   Next MD Visit 05/30/17   Prior Therapy none for shoulder      Observation/Other Assessments   Focus on Therapeutic Outcomes (FOTO)  31% limitation      AROM   Right Shoulder Flexion 153 Degrees   Right Shoulder ABduction 155 Degrees   Right Shoulder External Rotation 80 Degrees     Strength   Right Shoulder Flexion 5/5   Right Shoulder ABduction 5/5   Right Shoulder External Rotation 5/5      Palpation   Palpation comment decreasing tightness Rt pecs; upper trap; leveator; teres; deltoid; biceps tendon area                      [facility] Adult PT Treatment/Exercise - 05/12/17 0001      Shoulder Exercises: Sidelying   Other Sidelying Exercises sleeper stretch 30 sec x 3      Shoulder Exercises: Standing   Extension Strengthening;Both;Theraband;20 reps;10 reps  2 sets   Theraband Level (Shoulder Extension) Level 4 (Blue)   Row Strengthening;Both;Theraband;10 reps  2 sets   Theraband Level (Shoulder Row) Level 4 (Blue)   Row Weight (lbs) row at ~45 deg abd green TB x 20    Row Limitations step back for single arm row green TB x 20 each side    Retraction Strengthening;Both;Theraband;20 reps     Shoulder Exercises: Pulleys   Flexion --  10 reps 10 sec hold    ABduction --  10 sec x 10 reps      Shoulder Exercises: ROM/Strengthening   UBE (Upper Arm Bike) L2 2 min fwd/1 back 1 fwd      Shoulder Exercises: Stretch  Corner Stretch --  3 way doorway stretch 30 sec x 3 reps each    Internal Rotation Stretch 3 reps  standing with strap    Other Shoulder Stretches biceps stretch at table 30 sec x 3    Other Shoulder Stretches horizontal abduction stretch at 90 deg abd hand on wall 30 sec x 3      Moist Heat Therapy   Number Minutes Moist Heat 20 Minutes   Moist Heat Location Shoulder  Rt     Electrical Stimulation   Electrical Stimulation Location Rt shoulder    Electrical Stimulation Action IFC   Electrical Stimulation Parameters to tolerance   Electrical Stimulation Goals Pain;Tone     Manual Therapy   Manual therapy comments pt supine    Joint Mobilization Rt GH joint - mobs clavical/first rib    Soft tissue mobilization Rt shoulder girdle - pecs; upper trap; leveator; deltiod; teres; biceps; working through the clavicular area into first rib and lateral cervical musculature    Myofascial Release Rt pecs/biceps   Scapular Mobilization RT    Passive ROM Rt shoulder abd/flex/ ER/extension/horizontal abduction                       PT Long Term Goals - 05/12/17 0813      PT LONG TERM GOAL #1   Title Improve posture and alignment with patient to demonstrate improved upright posture with improve position of scapulae along the thoracic wall 05/18/17   Time 6   Period Weeks   Status Achieved     PT LONG TERM GOAL #2   Title Increase AROM Rt shoulder to equal or greater than AROM Lt shoudler 05/18/17   Time 6   Period Weeks   Status Achieved     PT LONG TERM GOAL #3   Title 5/5 strength without pain with resistive testing Rt shoulder 05/18/17   Time 6   Period Weeks   Status Achieved     PT LONG TERM GOAL #4   Title Return to normal functional activities with Rt UE without pain 05/18/17   Time 6   Period Weeks   Status Achieved     PT LONG TERM GOAL #5   Title Independent in HEP 05/18/17   Time 6   Period Weeks   Status Achieved     PT LONG TERM GOAL #6   Title Improve FOTO to </= 33% limitation 05/18/17   Time 6   Period Weeks   Status Achieved               Plan - 05/12/17 0817    Clinical Impression Statement Excellent progress with shoulder rehab. Patient is pleased with her progress and current level of function. She will be on vacation next week. She will call to schedule additional appointments when she returns from vacation if she feels she needs further treatment. Goals of theray partially accomplished.    Rehab Potential Good   PT Frequency 2x / week   PT Duration 6 weeks   PT Treatment/Interventions Patient/family education;ADLs/Self Care Home Management;Neuromuscular re-education;Cryotherapy;Electrical Stimulation;Iontophoresis 74m/ml Dexamethasone;Moist Heat;Ultrasound;Dry needling;Manual techniques   PT Next Visit Plan Patient will continue wit hindependent HEP and call for appointments after she returns from vacation if needed.    Consulted and Agree with Plan of Care Patient       Patient will benefit from skilled therapeutic intervention in order to improve the following deficits and impairments:  Improper body mechanics, Postural  dysfunction, Decreased range of motion, Decreased mobility, Decreased strength, Impaired UE functional use, Decreased activity tolerance  Visit Diagnosis: Chronic right shoulder pain  Abnormal posture  Muscle weakness (generalized)     Problem List Patient Active Problem List   Diagnosis Date Noted  . DJD of right AC (acromioclavicular) joint 04/01/2017  . Rotator cuff tendonitis, right 04/01/2017  . Fatty liver 03/22/2016  . CKD (chronic kidney disease) stage 3, GFR 30-59 ml/min 03/16/2016  . FATIGUE 12/16/2009  . Hypothyroidism 11/01/2008  . Obesity 08/27/2008  . SLEEP DISORDER 01/17/2008  . Diabetes mellitus without complication ([place]) 83/33/8329  . HYPERLIPIDEMIA 09/14/2006  . Essential hypertension 09/14/2006    Emma Craig PT, MPH  05/12/2017, 8:52 AM  [facility] [address] [place], [place], [address] Phone: [phone]   Fax:  [phone]  Name: Emma Craig MRN: [id number] Date of Birth: [birth year]  PHYSICAL THERAPY DISCHARGE SUMMARY  Visits from Start of Care: 12  Current functional level related to goals / functional outcomes: See last progress note for discharge status   Remaining deficits: Should continue with HEP for continued improvement    Education / Equipment: HEP  Plan: Patient agrees to discharge.  Patient goals were met. Patient is being discharged due to meeting the stated rehab goals.  ?????    Emma Craig P. Helene Kelp PT, MPH 06/09/17 12:59 PM

## 2017-05-19 ENCOUNTER — Other Ambulatory Visit: Payer: Self-pay | Admitting: Family Medicine

## 2017-05-24 ENCOUNTER — Encounter: Payer: Self-pay | Admitting: Family Medicine

## 2017-05-24 ENCOUNTER — Ambulatory Visit (INDEPENDENT_AMBULATORY_CARE_PROVIDER_SITE_OTHER): Admitting: Family Medicine

## 2017-05-24 ENCOUNTER — Encounter: Admitting: Physical Therapy

## 2017-05-24 VITALS — BP 149/70 | HR 84 | Ht 62.0 in | Wt 194.0 lb

## 2017-05-24 DIAGNOSIS — E119 Type 2 diabetes mellitus without complications: Secondary | ICD-10-CM

## 2017-05-24 DIAGNOSIS — I10 Essential (primary) hypertension: Secondary | ICD-10-CM | POA: Diagnosis not present

## 2017-05-24 LAB — POCT GLYCOSYLATED HEMOGLOBIN (HGB A1C): HEMOGLOBIN A1C: 6.3

## 2017-05-24 NOTE — Progress Notes (Addendum)
Subjective:    CC: DM, HTN  HPI:  Diabetes - no hypoglycemic events. No wounds or sores that are not healing well. No increased thirst or urination. Checking glucose at home. Taking medications as prescribed without any side effects.She reports she did have her eye exam done this year.  Hypertension- Pt denies chest pain, SOB, dizziness, or heart palpitations.  Taking meds as directed w/o problems.  Denies medication side effects.  She did forget to take her meds this AM.     Past medical history, Surgical history, Family history not pertinant except as noted below, Social history, Allergies, and medications have been entered into the medical record, reviewed, and corrections made.   Review of Systems: No fevers, chills, night sweats, weight loss, chest pain, or shortness of breath.   Objective:    General: Well Developed, well nourished, and in no acute distress.  Neuro: Alert and oriented x3, extra-ocular muscles intact, sensation grossly intact.  HEENT: Normocephalic, atraumatic  Skin: Warm and dry, no rashes. Cardiac: Regular rate and rhythm, no murmurs rubs or gallops, no lower extremity edema.  Respiratory: Clear to auscultation bilaterally. Not using accessory muscles, speaking in full sentences.   Impression and Recommendations:    DM- Well-controlled. Hemoglobin A1c of 6.3 today. Continue work on [employer] and regular exercise and weight loss. Will call for eye exam report.  HTN  - uncontrolled today but she forgot to take her meds today.  Recheck at follow-up.  Follow up in one month for nurse visit for BP check.  She is starting weight watchers.    Reminded her to schedule a mammogram.  She declined today.

## 2017-05-26 ENCOUNTER — Encounter: Admitting: Rehabilitative and Restorative Service Providers"

## 2017-05-30 ENCOUNTER — Ambulatory Visit (INDEPENDENT_AMBULATORY_CARE_PROVIDER_SITE_OTHER): Admitting: Family Medicine

## 2017-05-30 ENCOUNTER — Ambulatory Visit: Admitting: Family Medicine

## 2017-05-30 VITALS — BP 142/80 | HR 68 | Wt 195.0 lb

## 2017-05-30 DIAGNOSIS — M7581 Other shoulder lesions, right shoulder: Secondary | ICD-10-CM | POA: Diagnosis not present

## 2017-05-30 DIAGNOSIS — M19011 Primary osteoarthritis, right shoulder: Secondary | ICD-10-CM

## 2017-05-30 NOTE — Progress Notes (Signed)
   Emma Craig is a 66 y.o. female who presents to [facility] today for follow-up shoulder pain. Patient was seen 04/01/2017 for right shoulder pain. In the interim she's had multiple visits to physical therapy and feels almost completely better. She's delighted with how well she is doing.   Past Medical History:  Diagnosis Date  . Chronic kidney disease, stage 2, mildly decreased GFR   . Diabetes mellitus   . Heavy menses   . Hyperlipidemia   . Hypertension   . Obesity    Past Surgical History:  Procedure Laterality Date  . CATARACT EXTRACTION  1-09   right   . CATARACT EXTRACTION  09/2011   left   . CHOLECYSTECTOMY     laproscopic  . rt elbow screws placed  10-82   Social History  Substance Use Topics  . Smoking status: Never Smoker  . Smokeless tobacco: Never Used  . Alcohol use Yes     Comment: rare     ROS:  As above   Medications: Current Outpatient Prescriptions  Medication Sig Dispense Refill  . AMBULATORY NON FORMULARY MEDICATION Tumeric    . amLODipine-atorvastatin (CADUET) 10-80 MG tablet TAKE 1 TABLET DAILY 90 tablet 2  . aspirin 81 MG EC tablet Take 81 mg by mouth daily.      Marland Kitchen BENICAR 40 MG tablet TAKE 1 TABLET DAILY 90 tablet 2  . Cholecalciferol (VITAMIN D-3) 1000 UNITS CAPS Take 1 tablet by mouth daily.    . diclofenac sodium (VOLTAREN) 1 % GEL Apply 2 g topically 4 (four) times daily. To affected joint. 100 g 11  . fish oil-omega-3 fatty acids 1000 MG capsule Take 1 g by mouth daily.      Marland Kitchen levothyroxine (SYNTHROID, LEVOTHROID) 75 MCG tablet Take 1 tablet (75 mcg total) by mouth daily before breakfast. 90 tablet 2  . TOPROL XL 100 MG 24 hr tablet TAKE 1 TABLET DAILY IMMEDIATELY FOLLOWING A MEAL 90 tablet 1   No current facility-administered medications for this visit.    Allergies  Allergen Reactions  . Synthroid [Levothyroxine] Other (See Comments)    Tongue swelling b/c of filler.     Exam:  BP  (!) 142/80   Pulse 68   Wt 195 lb (88.5 kg)   LMP  (LMP Unknown)   BMI 35.67 kg/m  General: Well Developed, well nourished, and in no acute distress.  Neuro/Psych: Alert and oriented x3, extra-ocular muscles intact, able to move all 4 extremities, sensation grossly intact. Skin: Warm and dry, no rashes noted.  Respiratory: Not using accessory muscles, speaking in full sentences, trachea midline.  Cardiovascular: Pulses palpable, no extremity edema. Abdomen: Does not appear distended. MSK: Right shoulder normal-appearing nontender full range of motion. Normal strength.    No results found for this or any previous visit (from the past 48 hour(s)). No results found.    Assessment and Plan: 66 y.o. female with significant improvement in right shoulder pain. Plan to continue home physical therapy-type activities.  Health maintenance updated. Patient declined referral for mammogram or pneumonia vaccines. She notes that she got an eye exam about a month ago.    No orders of the defined types were placed in this encounter.  No orders of the defined types were placed in this encounter.   Discussed warning signs or symptoms. Please see discharge instructions. Patient expresses understanding.

## 2017-05-30 NOTE — Patient Instructions (Signed)
Thank you for coming in today. Continue the home exercises.  Recheck with me for this issue or other orthopedic issues any time.

## 2017-06-25 ENCOUNTER — Other Ambulatory Visit: Payer: Self-pay | Admitting: Family Medicine

## 2017-06-28 ENCOUNTER — Other Ambulatory Visit: Payer: Self-pay | Admitting: Family Medicine

## 2017-07-02 ENCOUNTER — Other Ambulatory Visit: Payer: Self-pay | Admitting: Family Medicine

## 2017-08-02 ENCOUNTER — Other Ambulatory Visit: Payer: Self-pay | Admitting: Family Medicine

## 2017-08-22 ENCOUNTER — Other Ambulatory Visit: Payer: Self-pay | Admitting: Family Medicine

## 2017-09-23 ENCOUNTER — Ambulatory Visit: Admitting: Family Medicine

## 2017-09-26 ENCOUNTER — Other Ambulatory Visit: Payer: Self-pay | Admitting: Family Medicine

## 2017-09-27 ENCOUNTER — Ambulatory Visit (INDEPENDENT_AMBULATORY_CARE_PROVIDER_SITE_OTHER): Admitting: Family Medicine

## 2017-09-27 ENCOUNTER — Encounter: Payer: Self-pay | Admitting: Family Medicine

## 2017-09-27 VITALS — BP 143/69 | HR 73 | Ht 62.0 in | Wt 193.0 lb

## 2017-09-27 DIAGNOSIS — R635 Abnormal weight gain: Secondary | ICD-10-CM

## 2017-09-27 DIAGNOSIS — Z6835 Body mass index (BMI) 35.0-35.9, adult: Secondary | ICD-10-CM | POA: Diagnosis not present

## 2017-09-27 DIAGNOSIS — I10 Essential (primary) hypertension: Secondary | ICD-10-CM | POA: Diagnosis not present

## 2017-09-27 DIAGNOSIS — E119 Type 2 diabetes mellitus without complications: Secondary | ICD-10-CM

## 2017-09-27 LAB — POCT GLYCOSYLATED HEMOGLOBIN (HGB A1C): HEMOGLOBIN A1C: 6.4

## 2017-09-27 LAB — BASIC METABOLIC PANEL WITH GFR
BUN: 18 mg/dL (ref 7–25)
CALCIUM: 9.6 mg/dL (ref 8.6–10.4)
CO2: 28 mmol/L (ref 20–32)
CREATININE: 0.95 mg/dL (ref 0.50–0.99)
Chloride: 103 mmol/L (ref 98–110)
GFR, EST AFRICAN AMERICAN: 73 mL/min/{1.73_m2} (ref >=60)
GFR, EST NON AFRICAN AMERICAN: 63 mL/min/{1.73_m2} (ref >=60)
Glucose, Bld: 116 mg/dL — ABNORMAL HIGH (ref 65–99)
POTASSIUM: 4.4 mmol/L (ref 3.5–5.3)
Sodium: 142 mmol/L (ref 135–146)

## 2017-09-27 NOTE — Progress Notes (Signed)
Subjective:    CC: DM, HTN  HPI:  Diabetes - no hypoglycemic events. No wounds or sores that are not healing well. No increased thirst or urination. Checking glucose at home. Taking medications as prescribed without any side effects.  She has had her eye exam. Will call for report.    Hypertension- Pt denies chest pain, SOB, dizziness, or heart palpitations.  Taking meds as directed w/o problems.  Denies medication side effects.    BMI 35 -she is still trying to be very active and try to work on her weight.  She says she is just very difficult.  She has been walking regularly, doing Zumba, and clogging.  She has not been swimming recently but plans to get back into it.  She is really been trying to work on her diet she really just struggles to eat a lot of vegetables that she really does not like them very much.  Past medical history, Surgical history, Family history not pertinant except as noted below, Social history, Allergies, and medications have been entered into the medical record, reviewed, and corrections made.   Review of Systems: No fevers, chills, night sweats, weight loss, chest pain, or shortness of breath.   Objective:    General: Well Developed, well nourished, and in no acute distress.  Neuro: Alert and oriented x3, extra-ocular muscles intact, sensation grossly intact.  HEENT: Normocephalic, atraumatic  Skin: Warm and dry, no rashes. Cardiac: Regular rate and rhythm, no murmurs rubs or gallops, no lower extremity edema.  Respiratory: Clear to auscultation bilaterally. Not using accessory muscles, speaking in full sentences.   Impression and Recommendations:    HTN  - Well controlled. Continue current regimen. Follow up in  6 months.   DM - she is on an ARB and statin.  A1C of 6.4.  Well controlled. Continue current regimen. Follow up in 4 months. Will call for eye exam.   Abnormal weight gain/BMI 35-we discussed some strategies around diet and getting more vegetables  in.  We also discussed continuing with regular exercise.  She is down 2 pounds from when I last saw her.  Just continue to work on it.

## 2017-09-27 NOTE — Progress Notes (Signed)
All labs are normal. 

## 2017-11-21 ENCOUNTER — Other Ambulatory Visit: Payer: Self-pay | Admitting: Family Medicine

## 2017-12-27 ENCOUNTER — Encounter: Payer: Self-pay | Admitting: Family Medicine

## 2018-01-25 ENCOUNTER — Encounter: Payer: Self-pay | Admitting: Family Medicine

## 2018-01-25 ENCOUNTER — Ambulatory Visit (INDEPENDENT_AMBULATORY_CARE_PROVIDER_SITE_OTHER): Admitting: Family Medicine

## 2018-01-25 VITALS — BP 134/70 | HR 75 | Ht 62.0 in | Wt 191.0 lb

## 2018-01-25 DIAGNOSIS — E119 Type 2 diabetes mellitus without complications: Secondary | ICD-10-CM

## 2018-01-25 DIAGNOSIS — L814 Other melanin hyperpigmentation: Secondary | ICD-10-CM

## 2018-01-25 DIAGNOSIS — K760 Fatty (change of) liver, not elsewhere classified: Secondary | ICD-10-CM

## 2018-01-25 DIAGNOSIS — I10 Essential (primary) hypertension: Secondary | ICD-10-CM

## 2018-01-25 DIAGNOSIS — E038 Other specified hypothyroidism: Secondary | ICD-10-CM | POA: Diagnosis not present

## 2018-01-25 LAB — CBC
HCT: 38.3 % (ref 35.0–45.0)
HEMOGLOBIN: 12.9 g/dL (ref 11.7–15.5)
MCH: 29.7 pg (ref 27.0–33.0)
MCHC: 33.7 g/dL (ref 32.0–36.0)
MCV: 88.2 fL (ref 80.0–100.0)
MPV: 10.4 fL (ref 7.5–12.5)
PLATELETS: 363 10*3/uL (ref 140–400)
RBC: 4.34 10*6/uL (ref 3.80–5.10)
RDW: 12.8 % (ref 11.0–15.0)
WBC: 7.5 10*3/uL (ref 3.8–10.8)

## 2018-01-25 LAB — TSH: TSH: 0.85 m[IU]/L (ref 0.40–4.50)

## 2018-01-25 LAB — LIPID PANEL
CHOL/HDL RATIO: 3.5 (calc) (ref <5.0)
Cholesterol: 160 mg/dL (ref <200)
HDL: 46 mg/dL — ABNORMAL LOW (ref >50)
LDL Cholesterol (Calc): 87 mg/dL (calc)
NON-HDL CHOLESTEROL (CALC): 114 mg/dL (ref <130)
TRIGLYCERIDES: 170 mg/dL — AB (ref <150)

## 2018-01-25 LAB — COMPLETE METABOLIC PANEL WITH GFR
AG Ratio: 1.5 (calc) (ref 1.0–2.5)
ALBUMIN MSPROF: 4.1 g/dL (ref 3.6–5.1)
ALT: 34 U/L — AB (ref 6–29)
AST: 26 U/L (ref 10–35)
Alkaline phosphatase (APISO): 91 U/L (ref 33–130)
BUN: 17 mg/dL (ref 7–25)
CALCIUM: 9.3 mg/dL (ref 8.6–10.4)
CO2: 30 mmol/L (ref 20–32)
CREATININE: 0.99 mg/dL (ref 0.50–0.99)
Chloride: 103 mmol/L (ref 98–110)
GFR, EST AFRICAN AMERICAN: 70 mL/min/{1.73_m2} (ref >=60)
GFR, EST NON AFRICAN AMERICAN: 60 mL/min/{1.73_m2} (ref >=60)
GLUCOSE: 112 mg/dL — AB (ref 65–99)
Globulin: 2.8 g/dL (calc) (ref 1.9–3.7)
Potassium: 4.8 mmol/L (ref 3.5–5.3)
Sodium: 142 mmol/L (ref 135–146)
TOTAL PROTEIN: 6.9 g/dL (ref 6.1–8.1)
Total Bilirubin: 0.3 mg/dL (ref 0.2–1.2)

## 2018-01-25 LAB — POCT GLYCOSYLATED HEMOGLOBIN (HGB A1C): HEMOGLOBIN A1C: 6.2

## 2018-01-25 MED ORDER — METOPROLOL SUCCINATE ER 100 MG PO TB24: 100.0000 mg | ORAL_TABLET | Freq: Every day | ORAL | 3 refills | Status: DC

## 2018-01-25 MED ORDER — OLMESARTAN MEDOXOMIL 40 MG PO TABS: 40.0000 mg | ORAL_TABLET | Freq: Every day | ORAL | 3 refills | Status: DC

## 2018-01-25 MED ORDER — LEVOTHYROXINE SODIUM 75 MCG PO TABS: 75.0000 ug | ORAL_TABLET | Freq: Every day | ORAL | 3 refills | Status: DC

## 2018-01-25 NOTE — Progress Notes (Signed)
Subjective:    CC: DM, BP, thyroid check.   HPI:  Diabetes - no hypoglycemic events. No wounds or sores that are not healing well. No increased thirst or urination. Checking glucose at home. Taking medications as prescribed without any side effects.  Hypertension- Pt denies chest pain, SOB, dizziness, or heart palpitations.  Taking meds as directed w/o problems.  Denies medication side effects.    Hypothyroid - taking medications.  No recent skin changes or hair loss.  No changes in energy.  She is taking her medications regularly.  She did have a few spots on her forearms that she wanted me to look at today.  Fatty liver - overall she is doing well.  Her weight is down 2 pounds.  She has been gradually losing weight since this past summer which is fantastic.  Past medical history, Surgical history, Family history not pertinant except as noted below, Social history, Allergies, and medications have been entered into the medical record, reviewed, and corrections made.   Review of Systems: No fevers, chills, night sweats, weight loss, chest pain, or shortness of breath.   Objective:    General: Well Developed, well nourished, and in no acute distress.  Neuro: Alert and oriented x3, extra-ocular muscles intact, sensation grossly intact.  HEENT: Normocephalic, atraumatic  Skin: Warm and dry, no rashes.  A few scattered solar lentigo on her forearms.  Cardiac: Regular rate and rhythm, no murmurs rubs or gallops, no lower extremity edema.  Respiratory: Clear to auscultation bilaterally. Not using accessory muscles, speaking in full sentences.   Impression and Recommendations:    DM -  Well controlled. Continue current regimen. Follow up in  4 months. A1C of 6.2.    HTN - Well controlled. Continue current regimen. Follow up in  4 months.    Hypothyroid - due to recheck TSH.    Solar lentigo-discussed diagnosis and explained that this is from long-term sun damage.  Benign.  F/U Fatty  liver - due to recheck liver enzymes.

## 2018-02-27 ENCOUNTER — Ambulatory Visit (INDEPENDENT_AMBULATORY_CARE_PROVIDER_SITE_OTHER): Admitting: Family Medicine

## 2018-02-27 ENCOUNTER — Encounter: Payer: Self-pay | Admitting: Family Medicine

## 2018-02-27 VITALS — BP 141/63 | HR 78 | Temp 98.0°F | Ht 62.01 in | Wt 194.0 lb

## 2018-02-27 DIAGNOSIS — J40 Bronchitis, not specified as acute or chronic: Secondary | ICD-10-CM

## 2018-02-27 DIAGNOSIS — J329 Chronic sinusitis, unspecified: Secondary | ICD-10-CM

## 2018-02-27 MED ORDER — AMOXICILLIN-POT CLAVULANATE 875-125 MG PO TABS
1.0000 | ORAL_TABLET | Freq: Two times a day (BID) | ORAL | 0 refills | Status: DC
Start: 2018-02-27 — End: 2018-05-24

## 2018-02-27 NOTE — Patient Instructions (Signed)
Upper Respiratory Infection, Adult Most upper respiratory infections (URIs) are caused by a virus. A URI affects the nose, throat, and upper air passages. The most common type of URI is often called "the common cold." Follow these instructions at home:  Take medicines only as told by your doctor.  Gargle warm saltwater or take cough drops to comfort your throat as told by your doctor.  Use a warm mist humidifier or inhale steam from a shower to increase air moisture. This may make it easier to breathe.  Drink enough fluid to keep your pee (urine) clear or pale yellow.  Eat soups and other clear broths.  Have a healthy diet.  Rest as needed.  Go back to work when your fever is gone or your doctor says it is okay. ? You may need to stay home longer to avoid giving your URI to others. ? You can also wear a face mask and wash your hands often to prevent spread of the virus.  Use your inhaler more if you have asthma.  Do not use any tobacco products, including cigarettes, chewing tobacco, or electronic cigarettes. If you need help quitting, ask your doctor. Contact a doctor if:  You are getting worse, not better.  Your symptoms are not helped by medicine.  You have chills.  You are getting more short of breath.  You have brown or red mucus.  You have yellow or brown discharge from your nose.  You have pain in your face, especially when you bend forward.  You have a fever.  You have puffy (swollen) neck glands.  You have pain while swallowing.  You have white areas in the back of your throat. Get help right away if:  You have very bad or constant: ? Headache. ? Ear pain. ? Pain in your forehead, behind your eyes, and over your cheekbones (sinus pain). ? Chest pain.  You have long-lasting (chronic) lung disease and any of the following: ? Wheezing. ? Long-lasting cough. ? Coughing up blood. ? A change in your usual mucus.  You have a stiff neck.  You have  changes in your: ? Vision. ? Hearing. ? Thinking. ? Mood. This information is not intended to replace advice given to you by your health care provider. Make sure you discuss any questions you have with your health care provider. Document Released: 05/03/2008 Document Revised: 07/18/2016 Document Reviewed: 02/20/2014 Elsevier Interactive Patient Education  2018 Elsevier Inc.  

## 2018-02-27 NOTE — Progress Notes (Signed)
   Subjective:    Patient ID: Emma Craig, female    DOB: 1951/09/16, 67 y.o.   MRN: [id number]  HPI 67 year old female comes in today complaining of cough and nasal congestion for 2 weeks.  She says she still mostly has a clear runny nose but in the morning she is coughing up a thick yellow mucus.  Most of the cough during the day is dry but it is very persistent.  She has not felt good in about 2 weeks.  It started while she was at [employer].  No fevers chills or sweats.  No ear pain.  She really only had a sore throat for 1 day.  She is also noticed a little intermittent wheezing in her chest.  Facial pain or pressure. Taking some OTC cold meds.    Review of Systems     Objective:   Physical Exam  Constitutional: She is oriented to person, place, and time. She appears well-developed and well-nourished.  HENT:  Head: Normocephalic and atraumatic.  Right Ear: External ear normal.  Left Ear: External ear normal.  Nose: Nose normal.  Mouth/Throat: Oropharynx is clear and moist.  TMs and canals are clear.   Eyes: Pupils are equal, round, and reactive to light. Conjunctivae and EOM are normal.  Neck: Neck supple. No thyromegaly present.  Cardiovascular: Normal rate, regular rhythm and normal heart sounds.  Pulmonary/Chest: Effort normal and breath sounds normal. She has no wheezes.  Lymphadenopathy:    She has no cervical adenopathy.  Neurological: She is alert and oriented to person, place, and time.  Skin: Skin is warm and dry.  Psychiatric: She has a normal mood and affect.       Assessment & Plan:  Sinobronchitis -she is been sick for 2 weeks and is not improving then the plan will be to treat with Augmentin.  If she is not significantly better in 1 week then please let Emma know.

## 2018-05-24 ENCOUNTER — Ambulatory Visit (INDEPENDENT_AMBULATORY_CARE_PROVIDER_SITE_OTHER): Admitting: Family Medicine

## 2018-05-24 ENCOUNTER — Encounter: Payer: Self-pay | Admitting: Family Medicine

## 2018-05-24 VITALS — BP 138/78 | HR 78 | Ht 62.0 in | Wt 191.0 lb

## 2018-05-24 DIAGNOSIS — E119 Type 2 diabetes mellitus without complications: Secondary | ICD-10-CM | POA: Diagnosis not present

## 2018-05-24 DIAGNOSIS — Z23 Encounter for immunization: Secondary | ICD-10-CM | POA: Diagnosis not present

## 2018-05-24 DIAGNOSIS — I10 Essential (primary) hypertension: Secondary | ICD-10-CM | POA: Diagnosis not present

## 2018-05-24 LAB — POCT GLYCOSYLATED HEMOGLOBIN (HGB A1C): HEMOGLOBIN A1C: 6.3 % — AB (ref 4.0–5.6)

## 2018-05-24 MED ORDER — OLMESARTAN MEDOXOMIL 40 MG PO TABS: 40.0000 mg | ORAL_TABLET | Freq: Every day | ORAL | 3 refills | Status: DC

## 2018-05-24 NOTE — Progress Notes (Signed)
Subjective:    CC: DM ck  HPI:  Diabetes - no hypoglycemic events. No wounds or sores that are not healing well. No increased thirst or urination. Checking glucose at home. Taking medications as prescribed without any side effects.  Hypertension- Pt denies chest pain, SOB, dizziness, or heart palpitations.  Taking meds as directed w/o problems.  Denies medication side effects.     Past medical history, Surgical history, Family history not pertinant except as noted below, Social history, Allergies, and medications have been entered into the medical record, reviewed, and corrections made.   Review of Systems: No fevers, chills, night sweats, weight loss, chest pain, or shortness of breath.   Objective:    General: Well Developed, well nourished, and in no acute distress.  Neuro: Alert and oriented x3, extra-ocular muscles intact, sensation grossly intact.  HEENT: Normocephalic, atraumatic  Skin: Warm and dry, no rashes. Cardiac: Regular rate and rhythm, no murmurs rubs or gallops, no lower extremity edema.  Respiratory: Clear to auscultation bilaterally. Not using accessory muscles, speaking in full sentences.   Impression and Recommendations:    HTN - Well controlled. Continue current regimen. Follow up in  4 months.    DM -well controlled.  Heme globin A1c of 6.3 today which looks fantastic.  She is actually scheduled for eye exam in August.  Last one was in May 2018.  She did check with her insurance and would like to get her first Shingrix vaccine today.  Repeat due in 2 to 6 months.

## 2018-07-27 ENCOUNTER — Ambulatory Visit (INDEPENDENT_AMBULATORY_CARE_PROVIDER_SITE_OTHER): Payer: Medicare Other | Admitting: Family Medicine

## 2018-07-27 ENCOUNTER — Encounter: Payer: Self-pay | Admitting: Family Medicine

## 2018-07-27 VITALS — BP 136/78 | HR 85 | Ht 62.0 in | Wt 192.0 lb

## 2018-07-27 DIAGNOSIS — Z0000 Encounter for general adult medical examination without abnormal findings: Secondary | ICD-10-CM

## 2018-07-27 DIAGNOSIS — Z780 Asymptomatic menopausal state: Secondary | ICD-10-CM

## 2018-07-27 DIAGNOSIS — Z13820 Encounter for screening for osteoporosis: Secondary | ICD-10-CM

## 2018-07-27 DIAGNOSIS — Z1231 Encounter for screening mammogram for malignant neoplasm of breast: Secondary | ICD-10-CM

## 2018-07-27 DIAGNOSIS — Z23 Encounter for immunization: Secondary | ICD-10-CM | POA: Diagnosis not present

## 2018-07-27 LAB — BASIC METABOLIC PANEL WITH GFR
BUN: 19 mg/dL (ref 7–25)
CHLORIDE: 102 mmol/L (ref 98–110)
CO2: 28 mmol/L (ref 20–32)
Calcium: 9.8 mg/dL (ref 8.6–10.4)
Creat: 0.88 mg/dL (ref 0.50–0.99)
GFR, Est African American: 80 mL/min/{1.73_m2} (ref >=60)
GFR, Est Non African American: 69 mL/min/{1.73_m2} (ref >=60)
GLUCOSE: 115 mg/dL — AB (ref 65–99)
Potassium: 5.2 mmol/L (ref 3.5–5.3)
Sodium: 140 mmol/L (ref 135–146)

## 2018-07-27 NOTE — Addendum Note (Signed)
Addended by: Beatrice Lecher D on: 07/27/2018 09:14 AM   Modules accepted: Level of Service

## 2018-07-27 NOTE — Patient Instructions (Signed)
  Health Maintenance  Topic Date Due  . MAMMOGRAM  10/30/2016  . DEXA SCAN  07/16/2018  . INFLUENZA VACCINE  09/28/2018 (Originally 06/29/2018)  . OPHTHALMOLOGY EXAM  09/29/2018 (Originally 04/05/2018)  . PNA vac Low Risk Adult (1 of 2 - PCV13) 07/28/2019 (Originally 07/16/2018)  . HEMOGLOBIN A1C  11/23/2018  . TETANUS/TDAP  02/15/2019  . PAP SMEAR  02/25/2019  . FOOT EXAM  05/25/2019  . COLONOSCOPY  05/20/2021  . Hepatitis C Screening  Completed  . HIV Screening  Completed     You are doing wonderful.  Just continue to stay active and exercise regularly. If you could bring [place] a copy of your advanced directives that would be fantastic. We have ordered your mammogram and bone density but so please schedule that at your convenience. Recommend [facility] Dr. Jacelyn Grip group over on [address] for your eye care.

## 2018-07-27 NOTE — Progress Notes (Signed)
Subjective:   Emma Craig is a 67 y.o. female who presents for Medicare Annual (Subsequent) preventive examination.  She did want to see if there are any recommendations for an eye doctor here locally.  She is willing to switch practices.   Review of Systems:  Comprehensive of review of systems is negative today.       Objective:     Vitals: BP 136/78   Pulse 85   Ht 5\' 2"  (1.575 m)   Wt 192 lb (87.1 kg)   LMP  (LMP Unknown)   SpO2 98%   BMI 35.12 kg/m   Body mass index is 35.12 kg/m.  Advanced Directives 07/27/2018 04/06/2017 03/12/2014  Does Patient Have a Medical Advance Directive? Yes No Patient does not have advance directive;Patient would like information  Type of Advance Directive [place] in Chart? No - copy requested - -  Would patient like information on creating a medical advance directive? - No - Patient declined Advance directive packet given    Tobacco Social History   Tobacco Use  Smoking Status Never Smoker  Smokeless Tobacco Never Used     Counseling given: Not Answered   Clinical Intake:    Physical Exam  Constitutional: She is oriented to person, place, and time. She appears well-developed and well-nourished.  HENT:  Head: Normocephalic and atraumatic.  Right Ear: External ear normal.  Left Ear: External ear normal.  Nose: Nose normal.  Mouth/Throat: Oropharynx is clear and moist.  TMs and canals are clear.   Eyes: Pupils are equal, round, and reactive to light. Conjunctivae and EOM are normal.  Neck: Neck supple. No thyromegaly present.  Cardiovascular: Normal rate, regular rhythm and normal heart sounds.  Pulmonary/Chest: Effort normal and breath sounds normal. She has no wheezes.  Lymphadenopathy:    She has no cervical adenopathy.  Neurological: She is alert and oriented to person, place, and time.  Skin: Skin is warm and dry.  Psychiatric: She has a normal mood and affect.                       Past Medical History:  Diagnosis Date  . Chronic kidney disease, stage 2, mildly decreased GFR   . Diabetes mellitus   . Heavy menses   . Hyperlipidemia   . Hypertension   . Obesity    Past Surgical History:  Procedure Laterality Date  . CATARACT EXTRACTION  1-09   right   . CATARACT EXTRACTION  09/2011   left   . CHOLECYSTECTOMY     laproscopic  . rt elbow screws placed  10-82   Family History  Problem Relation Age of Onset  . Cancer Father 85       colon and prostate CA  . Cancer Mother 12       carcinoid tumor/with mets to brain  . Hypertension Sister   . Hyperlipidemia Sister   . Hypertension Brother   . Hypertension Brother   . Hypertension Sister   . Hyperlipidemia Sister   . Hypertension Sister   . Hyperlipidemia Sister   . Hypertension Sister   . Hyperlipidemia Sister   . Lymphoma Sister        non-hodgkins    Social History   Socioeconomic History  . Marital status: Married    Spouse name: Not on file  . Number of children: Not on file  . Years of education: Not on  file  . Highest education level: Not on file  Occupational History  . Not on file  Social Needs  . Financial resource strain: Not on file  . Food insecurity:    Worry: Not on file    Inability: Not on file  . Transportation needs:    Medical: Not on file    Non-medical: Not on file  Tobacco Use  . Smoking status: Never Smoker  . Smokeless tobacco: Never Used  Substance and Sexual Activity  . Alcohol use: Yes    Comment: rare  . Drug use: Not on file  . 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  Lifestyle  . Physical activity:    Days per week: Not on file    Minutes per session: Not on file  . Stress: Not on file  Relationships  . Social connections:    Talks on phone: Not on file    Gets together: Not on file    Attends  religious service: Not on file    Active member of club or organization: Not on file    Attends meetings of clubs or organizations: Not on file    Relationship status: Not on file  Other Topics Concern  . Not on file  Social History Narrative   She cloggs for fun.      Outpatient Encounter Medications as of 07/27/2018  Medication Sig  . amLODipine-atorvastatin (CADUET) 10-80 MG tablet TAKE 1 TABLET DAILY  . aspirin 81 MG EC tablet Take 81 mg by mouth daily.    . Cholecalciferol (VITAMIN D-3) 1000 UNITS CAPS Take 1 tablet by mouth daily.  . fish oil-omega-3 fatty acids 1000 MG capsule Take 1 g by mouth daily.    Marland Kitchen levothyroxine (SYNTHROID, LEVOTHROID) 75 MCG tablet Take 1 tablet (75 mcg total) by mouth daily before breakfast.  . metoprolol succinate (TOPROL-XL) 100 MG 24 hr tablet Take 1 tablet (100 mg total) by mouth daily. Take with or immediately following a meal.  . olmesartan (BENICAR) 40 MG tablet Take 1 tablet (40 mg total) by mouth daily.   No facility-administered encounter medications on file as of 07/27/2018.     Activities of Daily Living In your present state of health, do you have any difficulty performing the following activities: 07/27/2018  Hearing? N  Vision? N  Difficulty concentrating or making decisions? N  Walking or climbing stairs? N  Dressing or bathing? N  Doing errands, shopping? N  Some recent data might be hidden    Patient Care Team: Hali Marry, MD as PCP - General (Family Medicine)    Assessment:   This is a routine wellness examination for Monzerat.  Exercise Activities and Dietary recommendations Current Exercise Habits: The patient does not participate in regular exercise at present, Exercise limited by: None identified  Goals   None     Fall Risk Fall Risk  07/27/2018 01/25/2018  Falls in the past year? No Yes   She fell while on vacation and tripped over a laundry basket.  She did have a bruise on her right hip but says it  eventually healed and went away and she is not having any persistent problems.    Depression Screen PHQ 2/9 Scores 07/27/2018 01/25/2018 05/24/2017 08/24/2013  PHQ - 2 Score 0 0 0 0     Cognitive Function     6CIT  Screen 07/27/2018  What Year? 0 points  What month? 0 points  What time? 0 points  Count back from 20 0 points  Months in reverse 0 points  Repeat phrase 6 points  Total Score 6    Immunization History  Administered Date(s) Administered  . Td 02/14/2009  . Zoster 01/10/2015  . Zoster Recombinat (Shingrix) 05/24/2018, 07/27/2018    Qualifies for Shingles Vaccine?completed series today.   Screening Tests Health Maintenance  Topic Date Due  . MAMMOGRAM  10/30/2016  . DEXA SCAN  07/16/2018  . INFLUENZA VACCINE  09/28/2018 (Originally 06/29/2018)  . OPHTHALMOLOGY EXAM  09/29/2018 (Originally 04/05/2018)  . PNA vac Low Risk Adult (1 of 2 - PCV13) 07/28/2019 (Originally 07/16/2018)  . HEMOGLOBIN A1C  11/23/2018  . TETANUS/TDAP  02/15/2019  . PAP SMEAR  02/25/2019  . FOOT EXAM  05/25/2019  . COLONOSCOPY  05/20/2021  . Hepatitis C Screening  Completed  . HIV Screening  Completed    Cancer Screenings: Lung: Low Dose CT Chest recommended if Age 15-80 years, 30 pack-year currently smoking OR have quit w/in 15years. Patient does not qualify. Breast:  Up to date on Mammogram? No   Up to date of Bone Density/Dexa? Yes Colorectal: Up to date.    Additional Screenings: Hepatitis C Screening:      Plan:   Welcome Medicare wellness exam I have personally reviewed and noted the following in the patient's chart:   . Medical and social history . Use of alcohol, tobacco or illicit drugs  . Current medications and supplements . Functional ability and status . Nutritional status . Physical activity . Advanced directives -she has a copy of these at home and we asked her to bring in a copy for [place] to have here and scanned into her chart. . List of other  physicians . Hospitalizations, surgeries, and ER visits in previous 12 months . Vitals . Screenings to include cognitive, depression, and falls . Referrals and appointments . Given second shingles vaccine today. . Says she wants to wait until October for her flu vaccine. Phillip Heal and bone density ordered. . EKG today shows normal sinus rhythm with rate of 76 bpm.  No abnormal ST-T wave changes.  In addition, I have reviewed and discussed with patient certain preventive protocols, quality metrics, and best practice recommendations. A written personalized care plan for preventive services as well as general preventive health recommendations were provided to patient.     Beatrice Lecher, MD  07/27/2018

## 2018-07-28 NOTE — Progress Notes (Signed)
All labs are normal. 

## 2018-08-09 ENCOUNTER — Ambulatory Visit (INDEPENDENT_AMBULATORY_CARE_PROVIDER_SITE_OTHER): Payer: Medicare Other

## 2018-08-09 ENCOUNTER — Encounter: Payer: Self-pay | Admitting: Family Medicine

## 2018-08-09 DIAGNOSIS — Z13820 Encounter for screening for osteoporosis: Secondary | ICD-10-CM

## 2018-08-09 DIAGNOSIS — Z1231 Encounter for screening mammogram for malignant neoplasm of breast: Secondary | ICD-10-CM

## 2018-08-09 DIAGNOSIS — M8588 Other specified disorders of bone density and structure, other site: Secondary | ICD-10-CM | POA: Diagnosis not present

## 2018-08-09 DIAGNOSIS — M810 Age-related osteoporosis without current pathological fracture: Secondary | ICD-10-CM | POA: Insufficient documentation

## 2018-08-09 DIAGNOSIS — Z780 Asymptomatic menopausal state: Secondary | ICD-10-CM

## 2018-09-07 DIAGNOSIS — E119 Type 2 diabetes mellitus without complications: Secondary | ICD-10-CM | POA: Diagnosis not present

## 2018-09-07 DIAGNOSIS — H52223 Regular astigmatism, bilateral: Secondary | ICD-10-CM | POA: Diagnosis not present

## 2018-09-27 ENCOUNTER — Encounter: Payer: Self-pay | Admitting: Family Medicine

## 2018-09-27 ENCOUNTER — Ambulatory Visit (INDEPENDENT_AMBULATORY_CARE_PROVIDER_SITE_OTHER): Payer: Medicare Other | Admitting: Family Medicine

## 2018-09-27 VITALS — BP 136/82 | HR 85 | Ht 61.46 in | Wt 198.0 lb

## 2018-09-27 DIAGNOSIS — E119 Type 2 diabetes mellitus without complications: Secondary | ICD-10-CM

## 2018-09-27 DIAGNOSIS — M79602 Pain in left arm: Secondary | ICD-10-CM

## 2018-09-27 DIAGNOSIS — M25551 Pain in right hip: Secondary | ICD-10-CM | POA: Diagnosis not present

## 2018-09-27 DIAGNOSIS — I10 Essential (primary) hypertension: Secondary | ICD-10-CM | POA: Diagnosis not present

## 2018-09-27 LAB — POCT GLYCOSYLATED HEMOGLOBIN (HGB A1C): HEMOGLOBIN A1C: 6.2 % — AB (ref 4.0–5.6)

## 2018-09-27 MED ORDER — AMLODIPINE-ATORVASTATIN 10-80 MG PO TABS: 1.0000 | ORAL_TABLET | Freq: Every day | ORAL | 3 refills | Status: DC

## 2018-09-27 NOTE — Progress Notes (Signed)
Subjective:    CC: DM  HPI:  Diabetes - no hypoglycemic events. No wounds or sores that are not healing well. No increased thirst or urination. Checking glucose at home. Taking medications as prescribed without any side effects.  Hypertension- Pt denies chest pain, SOB, dizziness, or heart palpitations.  Taking meds as directed w/o problems.  Denies medication side effects.    Also c/o of left upper arm pain for about 2 months since had her last shingles vaccine.  Says constantly sore.  Hasn't tried heat or ice or medication.  Worse if reaches back. No pain over her shoulder.     Past medical history, Surgical history, Family history not pertinant except as noted below, Social history, Allergies, and medications have been entered into the medical record, reviewed, and corrections made.   Review of Systems: No fevers, chills, night sweats, weight loss, chest pain, or shortness of breath.   Objective:    General: Well Developed, well nourished, and in no acute distress.  Neuro: Alert and oriented x3, extra-ocular muscles intact, sensation grossly intact.  HEENT: Normocephalic, atraumatic  Skin: Warm and dry, no rashes. Cardiac: Regular rate and rhythm, no murmurs rubs or gallops, no lower extremity edema.  Respiratory: Clear to auscultation bilaterally. Not using accessory muscles, speaking in full sentences. MSK: Left shoulder with normal range of motion and strength in all directions.  Negative empty can test.  Normal liftoff test.  No pain or discomfort with internal and external rotation.  No palpable masses over the bicep tendon.  Nontender on exam.  No swelling or erythema.   Impression and Recommendations:    DM - Well controlled. Continue current regimen. Follow up in  68months.   Lab Results  Component Value Date   HGBA1C 6.2 (A) 09/27/2018   HTN - Well controlled. Continue current regimen. Follow up in  4 months.   Left upper arm pain - unclear etiology. No palpable  lesions or mass.  nontender on exam. Normal Left shoulder exam.  We discussed options including getting an ultrasound and/or x-ray for further work-up.  She is declined and says she will keep an eye on it given another month or 2 to see if it completely resolves.  Encouraged her to at least try heat and may be some Tylenol or ibuprofen just to see if it provides some pain relief.  Right hip pain-she still having a little discomfort in her right hip from when she fell in a hotel room back in July but is getting better overall.  Give her some stretches for trochanteric bursitis to do on her own at home.  She says she has still been clogging so it has not kept her from doing what she enjoys.

## 2018-10-19 ENCOUNTER — Telehealth: Payer: Self-pay

## 2018-10-19 NOTE — Telephone Encounter (Signed)
PT came in the office with a bill from [facility] for her 07/27/18 visit. Pt states there shouldn't be a charge since it was a medicare wellness visit. Made a copy of her bill and put in [facility] box.

## 2019-01-20 ENCOUNTER — Other Ambulatory Visit: Payer: Self-pay | Admitting: Family Medicine

## 2019-01-24 ENCOUNTER — Other Ambulatory Visit: Payer: Self-pay | Admitting: Family Medicine

## 2019-01-29 ENCOUNTER — Encounter: Payer: Self-pay | Admitting: Family Medicine

## 2019-01-29 ENCOUNTER — Ambulatory Visit (INDEPENDENT_AMBULATORY_CARE_PROVIDER_SITE_OTHER): Payer: Medicare Other | Admitting: Family Medicine

## 2019-01-29 VITALS — BP 155/84 | HR 99 | Ht 61.46 in | Wt 197.0 lb

## 2019-01-29 DIAGNOSIS — E038 Other specified hypothyroidism: Secondary | ICD-10-CM

## 2019-01-29 DIAGNOSIS — M25512 Pain in left shoulder: Secondary | ICD-10-CM | POA: Diagnosis not present

## 2019-01-29 DIAGNOSIS — E119 Type 2 diabetes mellitus without complications: Secondary | ICD-10-CM

## 2019-01-29 DIAGNOSIS — I10 Essential (primary) hypertension: Secondary | ICD-10-CM

## 2019-01-29 DIAGNOSIS — K760 Fatty (change of) liver, not elsewhere classified: Secondary | ICD-10-CM | POA: Diagnosis not present

## 2019-01-29 DIAGNOSIS — G43109 Migraine with aura, not intractable, without status migrainosus: Secondary | ICD-10-CM | POA: Diagnosis not present

## 2019-01-29 LAB — POCT GLYCOSYLATED HEMOGLOBIN (HGB A1C): HEMOGLOBIN A1C: 6.3 % — AB (ref 4.0–5.6)

## 2019-01-29 NOTE — Progress Notes (Signed)
Subjective:    CC:   HPI:  Diabetes - no hypoglycemic events. No wounds or sores that are not healing well. No increased thirst or urination. Checking glucose at home. Taking medications as prescribed without any side effects.  Hypothyroidism - Taking medication regularly in the AM away from food and vitamins, etc. No recent change to skin, hair, or energy levels.  Hypertension- Pt denies chest pain, SOB, dizziness, or heart palpitations.  Taking meds as directed w/o problems.  Denies medication side effects.  She forgot her medication yesterday and today.    She also complains of left shoulder plain-he says it started to bother her similar to when her right shoulder was bothering her.  She says he notices painful to sleep on and has to get off of it in the middle the night.  She is able to lift her arm all the way 280 degrees but says she feels like it is getting more more stiff.  No specific injury or trauma.  Did go to PT originally for her right shoulder.  She also wanted to discuss the possibility of ocular migraines.  She says that for years she has had episodes where she will get a headache and then it is followed by blurry vision particularly in her left eye it will last about an hour and a half and then go away.  She actually started keeping track of how often it happens she mentioned it to her new eye doctor..  They felt like it could be consistent with ocular migraines.   Past medical history, Surgical history, Family history not pertinant except as noted below, Social history, Allergies, and medications have been entered into the medical record, reviewed, and corrections made.   Review of Systems: No fevers, chills, night sweats, weight loss, chest pain, or shortness of breath.   Objective:    General: Well Developed, well nourished, and in no acute distress.  Neuro: Alert and oriented x3, extra-ocular muscles intact, sensation grossly intact.  HEENT: Normocephalic, atraumatic   Skin: Warm and dry, no rashes. Cardiac: Regular rate and rhythm, no murmurs rubs or gallops, no lower extremity edema.  Respiratory: Clear to auscultation bilaterally. Not using accessory muscles, speaking in full sentences.   Impression and Recommendations:    DM - Well controlled. Continue current regimen. Follow up in  4 months with me. Due for screening labs. .  F/U in 2 weeks for nurse visit recheck BP.   Hypothyroidism - due to recheck levels.  Otherwise well controlled.  HTN -she has not taken her medication for the last 2 days she says she was keeping her grand kids and got busy and forgot to take it.  Sure restart medication.  Due for CMP.  Fatty liver - due to recheck liver enzymes.   Occular migraines  -sound like she gets ocular migraines.  We discussed possible prophylaxis as she brought in a list where she was keeping track of how often they happen.  She had 3 days in November, 1 day in December, 4 days in January, and 2 days in February.  She wonders if some of this could be stress triggered and says for now she wants to hold off on prophylaxis but will keep track of them over the next couple months. We possible prophylaxis.  Could consider topiramate versus amitriptyline 10 mg at bedtime.  She wants to continue to monitor it for the next couple months and see if she is able to reduce her stress of her  blood pressure and her migraines improved.  Shoulder pain, left -did not fully examine today we just discussed her starting to do her home exercises that she learned from PT on the opposite shoulder at first for the next couple weeks and then if not improving to get in with Dr. Georgina Snell.

## 2019-01-30 LAB — COMPLETE METABOLIC PANEL WITH GFR
AG RATIO: 1.6 (calc) (ref 1.0–2.5)
ALBUMIN MSPROF: 4.5 g/dL (ref 3.6–5.1)
ALT: 30 U/L — ABNORMAL HIGH (ref 6–29)
AST: 24 U/L (ref 10–35)
Alkaline phosphatase (APISO): 96 U/L (ref 37–153)
BUN: 16 mg/dL (ref 7–25)
CO2: 26 mmol/L (ref 20–32)
Calcium: 9.6 mg/dL (ref 8.6–10.4)
Chloride: 104 mmol/L (ref 98–110)
Creat: 0.94 mg/dL (ref 0.50–0.99)
GFR, EST AFRICAN AMERICAN: 74 mL/min/{1.73_m2} (ref >=60)
GFR, EST NON AFRICAN AMERICAN: 64 mL/min/{1.73_m2} (ref >=60)
Globulin: 2.8 g/dL (calc) (ref 1.9–3.7)
Glucose, Bld: 104 mg/dL — ABNORMAL HIGH (ref 65–99)
POTASSIUM: 4.7 mmol/L (ref 3.5–5.3)
Sodium: 142 mmol/L (ref 135–146)
TOTAL PROTEIN: 7.3 g/dL (ref 6.1–8.1)
Total Bilirubin: 0.3 mg/dL (ref 0.2–1.2)

## 2019-01-30 LAB — LIPID PANEL
Cholesterol: 196 mg/dL (ref <200)
HDL: 58 mg/dL (ref >=50)
LDL CHOLESTEROL (CALC): 110 mg/dL — AB
NON-HDL CHOLESTEROL (CALC): 138 mg/dL — AB (ref <130)
Total CHOL/HDL Ratio: 3.4 (calc) (ref <5.0)
Triglycerides: 162 mg/dL — ABNORMAL HIGH (ref <150)

## 2019-01-30 LAB — TSH: TSH: 0.76 m[IU]/L (ref 0.40–4.50)

## 2019-02-13 ENCOUNTER — Ambulatory Visit: Payer: Medicare Other

## 2019-04-27 ENCOUNTER — Ambulatory Visit (INDEPENDENT_AMBULATORY_CARE_PROVIDER_SITE_OTHER): Payer: Medicare Other | Admitting: Family Medicine

## 2019-04-27 ENCOUNTER — Other Ambulatory Visit: Payer: Self-pay

## 2019-04-27 ENCOUNTER — Ambulatory Visit (INDEPENDENT_AMBULATORY_CARE_PROVIDER_SITE_OTHER): Payer: Medicare Other

## 2019-04-27 ENCOUNTER — Encounter: Payer: Self-pay | Admitting: Family Medicine

## 2019-04-27 VITALS — BP 157/67 | HR 83 | Temp 98.1°F | Wt 199.0 lb

## 2019-04-27 DIAGNOSIS — M25512 Pain in left shoulder: Secondary | ICD-10-CM | POA: Diagnosis not present

## 2019-04-27 MED ORDER — DICLOFENAC SODIUM 1 % TD GEL: 4.0000 g | Freq: Four times a day (QID) | TRANSDERMAL | 11 refills | Status: DC

## 2019-04-27 MED ORDER — TETANUS-DIPHTH-ACELL PERTUSSIS 5-2-15.5 LF-MCG/0.5 IM SUSP: 0.5000 mL | Freq: Once | INTRAMUSCULAR | 0 refills | Status: AC

## 2019-04-27 MED ORDER — DTAP-IPV VACCINE IM SUSP: 0.5000 mL | Freq: Once | INTRAMUSCULAR | 0 refills | Status: DC

## 2019-04-27 NOTE — Progress Notes (Signed)
Emma Craig is a 68 y.o. female who presents to [facility] today for left shoulder pain.   Patient was seen on March 2 by her PCP.  At that time she was complaining of left shoulder pain.  She was advised to follow-up with me.  She notes that her shoulder continues to hurt.  She notes pain in the left lateral upper arm.  Pain is present with overhead reach reaching back and at bedtime.  She notes some pain extending to the elbow but not beyond it.  She denies weakness or numbness.  She denies any injury.  She is tried some home exercises that she was taught for her right shoulder few years ago as well as Advil.  She had a history of right shoulder pain that responded very well to physical therapy in 2018.   ROS:  As above  Exam:  BP (!) 157/67   Pulse 83   Temp 98.1 F (36.7 C) (Oral)   Wt 199 lb (90.3 kg)   LMP  (LMP Unknown)   BMI 37.04 kg/m   Wt Readings from Last 5 Encounters:  04/27/19 199 lb (90.3 kg)  01/29/19 197 lb (89.4 kg)  09/27/18 198 lb (89.8 kg)  07/27/18 192 lb (87.1 kg)  05/24/18 191 lb (86.6 kg)   General: Well Developed, well nourished, and in no acute distress.  Neuro/Psych: Alert and oriented x3, extra-ocular muscles intact, able to move all 4 extremities, sensation grossly intact. Skin: Warm and dry, no rashes noted.  Respiratory: Not using accessory muscles, speaking in full sentences, trachea midline.  Cardiovascular: Pulses palpable, no extremity edema. Abdomen: Does not appear distended. MSK:  C-spine: Nontender to spinal midline.  Normal cervical motion.  Negative Spurling's test. Left shoulder normal-appearing not particularly tender. Abduction limited to 170 degrees.  Forward flexion limited to 100 degrees by pain.  Internal rotation limited to lumbar spine.  External rotation limited 40 degrees beyond neutral position. Strength intact. Positive empty can test. Positive Hawkins and Neer's test.  Negative Yergason's and speeds test.  Contralateral right shoulder normal-appearing nontender. Full range of motion including normal external rotation. Intact strength. Negative impingement testing.  Pulses cap refill and sensation are intact distally bilateral upper extremities.    Lab and Radiology Results X-ray left shoulder images personally independently reviewed No apparent fractures.  No severe degenerative changes.  Relatively normal x-ray. Await formal radiology review    Assessment and Plan: 68 y.o. female with left shoulder pain.  Likely rotator cuff tendinopathy.    Plan for diclofenac gel and trial of physical therapy.  Patient had significant benefit with this for the contralateral right shoulder 2 years ago. Recheck in 4 weeks.  Return sooner if needed.  Neck step would be steroid injection.  Additionally patient overdue for Tdap vaccine.  Given she has Medicare will send this prescription to pharmacy.   PDMP not reviewed this encounter. Orders Placed This Encounter  Procedures  . DG Shoulder Left    Standing Status:   Future    Number of Occurrences:   1    Standing Expiration Date:   06/26/2020    Order Specific Question:   Reason for Exam (SYMPTOM  OR DIAGNOSIS REQUIRED)    Answer:   eval left shoulder pain x 1 year    Order Specific Question:   Preferred imaging location?    Answer:   [facility]    Order Specific Question:   Radiology Contrast Protocol -  do NOT remove file path    Answer:   [email or web address]  . Ambulatory referral to Physical Therapy    Referral Priority:   Routine    Referral Type:   Physical Medicine    Referral Reason:   Specialty Services Required    Requested Specialty:   Physical Therapy   Meds ordered this encounter  Medications  . diclofenac sodium (VOLTAREN) 1 % GEL    Sig: Apply 4 g topically 4 (four) times daily. To affected joint.    Dispense:  100 g    Refill:  11   . DISCONTD: DTaP-IPV Lynann Bologna) injection    Sig: Inject 0.5 mLs into the muscle once for 1 dose.    Dispense:  0.5 mL    Refill:  0  . Tdap (ADACEL) 03-30-14.5 LF-MCG/0.5 injection    Sig: Inject 0.5 mLs into the muscle once for 1 dose.    Dispense:  0.5 mL    Refill:  0    Correction from prior Nexium.  Accidentally sent Tdap with IPV    Historical information moved to improve visibility of documentation.  Past Medical History:  Diagnosis Date  . Chronic kidney disease, stage 2, mildly decreased GFR   . Diabetes mellitus   . Heavy menses   . Hyperlipidemia   . Hypertension   . Obesity    Past Surgical History:  Procedure Laterality Date  . CATARACT EXTRACTION  1-09   right   . CATARACT EXTRACTION  09/2011   left   . CHOLECYSTECTOMY     laproscopic  . rt elbow screws placed  10-82   Social History   Tobacco Use  . Smoking status: Never Smoker  . Smokeless tobacco: Never Used  Substance Use Topics  . Alcohol use: Yes    Comment: rare   family history includes Cancer (age of onset: 97) in her mother; Cancer (age of onset: 6) in her father; Hyperlipidemia in her sister, sister, sister, and sister; Hypertension in her brother, brother, sister, sister, sister, and sister; Lymphoma in her sister.  Medications: Current Outpatient Medications  Medication Sig Dispense Refill  . amLODipine-atorvastatin (CADUET) 10-80 MG tablet Take 1 tablet by mouth daily. 90 tablet 3  . aspirin 81 MG EC tablet Take 81 mg by mouth daily.      . Calcium Carb-Cholecalciferol (CALTRATE 600+D3 PO) Take 1 tablet by mouth daily.    . Cholecalciferol (VITAMIN D-3) 1000 UNITS CAPS Take 1 tablet by mouth daily.    . diclofenac sodium (VOLTAREN) 1 % GEL Apply 4 g topically 4 (four) times daily. To affected joint. 100 g 11  . fish oil-omega-3 fatty acids 1000 MG capsule Take 1 g by mouth daily.      Marland Kitchen levothyroxine (SYNTHROID, LEVOTHROID) 75 MCG tablet TAKE 1 TABLET DAILY BEFORE BREAKFAST 90 tablet 4  .  olmesartan (BENICAR) 40 MG tablet Take 1 tablet (40 mg total) by mouth daily. 90 tablet 3  . TOPROL XL 100 MG 24 hr tablet TAKE 1 TABLET DAILY. TAKE WITH OR IMMEDIATELY FOLLOWING A MEAL 90 tablet 4  . Tdap (ADACEL) 03-30-14.5 LF-MCG/0.5 injection Inject 0.5 mLs into the muscle once for 1 dose. 0.5 mL 0   No current facility-administered medications for this visit.    Allergies  Allergen Reactions  . Synthroid [Levothyroxine] Other (See Comments)    Tongue swelling b/c of filler.      Discussed warning signs or symptoms. Please see discharge instructions. Patient expresses understanding.

## 2019-04-27 NOTE — Patient Instructions (Signed)
Thank you for coming in today. Get xray now.  Use diclofenac gel on the shoulder up to 4x daily for pain as needed.  Attend PT.  Check back in 4 weeks or sooner if needed.  You do not need to return if you are all better.  Get tdap vaccine at pharmacy.    Shoulder Impingement Syndrome Rehab Ask your health care provider which exercises are safe for you. Do exercises exactly as told by your health care provider and adjust them as directed. It is normal to feel mild stretching, pulling, tightness, or discomfort as you do these exercises, but you should stop right away if you feel sudden pain or your pain gets worse.Do not begin these exercises until told by your health care provider. Stretching and range of motion exercise This exercise warms up your muscles and joints and improves the movement and flexibility of your shoulder. This exercise also helps to relieve pain and stiffness. Exercise A: Passive horizontal adduction  1. Sit or stand and pull your left / right elbow across your chest, toward your other shoulder. Stop when you feel a gentle stretch in the back of your shoulder and upper arm. ? Keep your arm at shoulder height. ? Keep your arm as close to your body as you comfortably can. 2. Hold for __________ seconds. 3. Slowly return to the starting position. Repeat __________ times. Complete this exercise __________ times a day. Strengthening exercises These exercises build strength and endurance in your shoulder. Endurance is the ability to use your muscles for a long time, even after they get tired. Exercise B: External rotation, isometric 1. Stand or sit in a doorway, facing the door frame. 2. Bend your left / right elbow and place the back of your wrist against the door frame. Only your wrist should be touching the frame. Keep your upper arm at your side. 3. Gently press your wrist against the door frame, as if you are trying to push your arm away from your abdomen. ? Avoid  shrugging your shoulder while you press your hand against the door frame. Keep your shoulder blade tucked down toward the middle of your back. 4. Hold for __________ seconds. 5. Slowly release the tension, and relax your muscles completely before you do the exercise again. Repeat __________ times. Complete this exercise __________ times a day. Exercise C: Internal rotation, isometric  1. Stand or sit in a doorway, facing the door frame. 2. Bend your left / right elbow and place the inside of your wrist against the door frame. Only your wrist should be touching the frame. Keep your upper arm at your side. 3. Gently press your wrist against the door frame, as if you are trying to push your arm toward your abdomen. ? Avoid shrugging your shoulder while you press your hand against the door frame. Keep your shoulder blade tucked down toward the middle of your back. 4. Hold for __________ seconds. 5. Slowly release the tension, and relax your muscles completely before you do the exercise again. Repeat __________ times. Complete this exercise __________ times a day. Exercise D: Scapular protraction, supine  1. Lie on your back on a firm surface. Hold a __________ weight in your left / right hand. 2. Raise your left / right arm straight into the air so your hand is directly above your shoulder joint. 3. Push the weight into the air so your shoulder lifts off of the surface that you are lying on. Do not move your head,  neck, or back. 4. Hold for __________ seconds. 5. Slowly return to the starting position. Let your muscles relax completely before you repeat this exercise. Repeat __________ times. Complete this exercise __________ times a day. Exercise E: Scapular retraction  1. Sit in a stable chair without armrests, or stand. 2. Secure an exercise band to a stable object in front of you so the band is at shoulder height. 3. Hold one end of the exercise band in each hand. Your palms should face  down. 4. Squeeze your shoulder blades together and move your elbows slightly behind you. Do not shrug your shoulders while you do this. 5. Hold for __________ seconds. 6. Slowly return to the starting position. Repeat __________ times. Complete this exercise __________ times a day. Exercise F: Shoulder extension  1. Sit in a stable chair without armrests, or stand. 2. Secure an exercise band to a stable object in front of you where the band is above shoulder height. 3. Hold one end of the exercise band in each hand. 4. Straighten your elbows and lift your hands up to shoulder height. 5. Squeeze your shoulder blades together and pull your hands down to the sides of your thighs. Stop when your hands are straight down by your sides. Do not let your hands go behind your body. 6. Hold for __________ seconds. 7. Slowly return to the starting position. Repeat __________ times. Complete this exercise __________ times a day. This information is not intended to replace advice given to you by your health care provider. Make sure you discuss any questions you have with your health care provider. Document Released: 11/15/2005 Document Revised: 07/22/2016 Document Reviewed: 10/18/2015 Elsevier Interactive Patient Education  2019 [employer].

## 2019-05-01 ENCOUNTER — Encounter: Payer: Self-pay | Admitting: Rehabilitative and Restorative Service Providers"

## 2019-05-01 ENCOUNTER — Ambulatory Visit (INDEPENDENT_AMBULATORY_CARE_PROVIDER_SITE_OTHER): Payer: Medicare Other | Admitting: Rehabilitative and Restorative Service Providers"

## 2019-05-01 ENCOUNTER — Other Ambulatory Visit: Payer: Self-pay

## 2019-05-01 DIAGNOSIS — R29898 Other symptoms and signs involving the musculoskeletal system: Secondary | ICD-10-CM | POA: Diagnosis not present

## 2019-05-01 DIAGNOSIS — G8929 Other chronic pain: Secondary | ICD-10-CM | POA: Diagnosis not present

## 2019-05-01 DIAGNOSIS — M25512 Pain in left shoulder: Secondary | ICD-10-CM

## 2019-05-01 DIAGNOSIS — R293 Abnormal posture: Secondary | ICD-10-CM | POA: Diagnosis not present

## 2019-05-01 DIAGNOSIS — M6281 Muscle weakness (generalized): Secondary | ICD-10-CM | POA: Diagnosis not present

## 2019-05-01 NOTE — Patient Instructions (Signed)
Access Code: [email or web address]  URL: [email or web address]  Date: 05/01/2019  Prepared by: Gillermo Murdoch   Exercises  Seated Shoulder Flexion AAROM with Pulley Behind - 10 reps - 1 sets - 10 sec hold - 2x daily - 7x weekly  Seated Shoulder Scaption AAROM with Pulley at Side - 10 reps - 1 sets - 10 sec hold - 2x daily - 7x weekly  Seated Cervical Retraction - 10 reps - 1 sets - 3x daily - 7x weekly  Standing Scapular Retraction - 10 reps - 1 sets - 10 hold - 3x daily - 7x weekly  Shoulder External Rotation and Scapular Retraction - 10 reps - 1 sets - hold - 3x daily - 7x weekly  Standing Scapular Retraction in Abduction - 10 reps - 1 sets - 3x daily - 7x weekly  Standing 'L' Stretch at Counter - 3-5 reps - 1 sets - 10 sec hold - 2x daily - 7x weekly

## 2019-05-01 NOTE — Therapy (Signed)
[place], [place], [address] Phone: [phone]   Fax:  [phone]  Physical Therapy Evaluation  Patient Details  Name: Emma Craig MRN: [id number] Date of Birth: May 14, 1951 Referring Provider (PT): Dr Lynne Leader    Encounter Date: 05/01/2019  PT End of Session - 05/01/19 0804    Visit Number  1    Number of Visits  12    Date for PT Re-Evaluation  06/12/19    PT Start Time  0805    PT Stop Time  0904    PT Time Calculation (min)  59 min    Activity Tolerance  Patient tolerated treatment well       Past Medical History:  Diagnosis Date  . Chronic kidney disease, stage 2, mildly decreased GFR   . Diabetes mellitus   . Heavy menses   . Hyperlipidemia   . Hypertension   . Obesity     Past Surgical History:  Procedure Laterality Date  . CATARACT EXTRACTION  1-09   right   . CATARACT EXTRACTION  09/2011   left   . CHOLECYSTECTOMY     laproscopic  . rt elbow screws placed  10-82    There were no vitals filed for this visit.   Subjective Assessment - 05/01/19 0809    Subjective  Gradual onset of Lt shoulder pain over the past year with no known injury. Paiin has continued to increase in the past year. Pain started after she received a shingles shot.     Pertinent History  Rt shoulder dysfunction treated conservatively ~ 2 years ago. HTN; AODM     Patient Stated Goals  get rid of the shoulder pain and use arm normally     Currently in Pain?  Yes    Pain Score  4    increased 10/10 for a few seconds with certain movements    Pain Location  Shoulder    Pain Orientation  Left    Pain Descriptors / Indicators  Sharp;Dull;Aching;Nagging    Pain Type  Chronic pain    Pain Radiating Towards  into Lt elbow and forearm; once to wrist     Pain Onset  More than a month ago    Pain Frequency  Constant    Aggravating Factors   using arm; lying on Lt side; lifting; reaching     Pain Relieving  Factors  rest arm by side          [facility] PT Assessment - 05/01/19 0001      Assessment   Medical Diagnosis  Lt shoulder dysfunction     Referring Provider (PT)  Dr Lynne Leader     Onset Date/Surgical Date  05/27/18    Hand Dominance  Right    Next MD Visit  05/25/2019    Prior Therapy  here for Rt shoulder pain 2019       Precautions   Precautions  None      Restrictions   Weight Bearing Restrictions  No      Balance Screen   Has the patient fallen in the past 6 months  No    Has the patient had a decrease in activity level because of a fear of falling?   No    Is the patient reluctant to leave their home because of a fear of falling?   No      Home Environment   Additional Comments  single level; level entry  Prior Function   Level of Independence  Independent    Vocation  Retired    [employer]     Leisure  household chores; puzzles; grandchildren       Observation/Other Assessments   Focus on Therapeutic Outcomes (FOTO)   53% limititation       Sensation   Additional Comments  WFL's per pt report       Posture/Postural Control   Posture Comments  head forward; shoulders rounded and elevated; head of the humerus anterior in orientation; scapulae abducted and rotated along the thoracic wall       AROM   Right/Left Shoulder  --   pain with AROM Lt shoulder flex/abd/IR/ER    Right Shoulder Extension  42 Degrees    Right Shoulder Flexion  146 Degrees    Right Shoulder ABduction  154 Degrees    Right Shoulder Internal Rotation  24 Degrees    Right Shoulder External Rotation  89 Degrees    Left Shoulder Extension  38 Degrees    Left Shoulder Flexion  126 Degrees    Left Shoulder ABduction  128 Degrees    Left Shoulder Internal Rotation  16 Degrees    Left Shoulder External Rotation  53 Degrees      Strength   Overall Strength Comments  WFL's bilat UE's pain with resistive testing Lt shoulder flex/abd/IR/ER       Palpation   Spinal  mobility  hypomobility through thoracic spine with CPA mobs     Palpation comment  muscular tightness through the Lt pecs; upper trap; leveator; teres; bicpes; deltoid                 Objective measurements completed on examination: See above findings.      [facility] Adult PT Treatment/Exercise - 05/01/19 0001      Neuro Re-ed    Neuro Re-ed Details   postural correction       Shoulder Exercises: Standing   Other Standing Exercises  axial extension 10 sec x 5; scap squeeze 10 sec x 5; L's x 10' W's x 10 with noodle       Shoulder Exercises: Pulleys   Flexion  --   10 sec hold x 10 reps    Scaption  --   10 sec hold x 10 reps      Shoulder Exercises: Stretch   Table Stretch - Flexion  2 reps;10 seconds   hands on counter step back to stretch      Moist Heat Therapy   Number Minutes Moist Heat  15 Minutes    Moist Heat Location  Shoulder      Electrical Stimulation   Electrical Stimulation Location  Lt shoulder girdle     Electrical Stimulation Action  IFC    Electrical Stimulation Parameters  to tolerance    Electrical Stimulation Goals  Pain;Tone             PT Education - 05/01/19 [id number]    Education Details  HEP POC    Person(s) Educated  Patient    Methods  Explanation;Demonstration;Tactile cues;Verbal cues;Handout    Comprehension  Verbalized understanding;Returned demonstration;Verbal cues required;Tactile cues required          PT Long Term Goals - 05/01/19 1010      PT LONG TERM GOAL #1   Title  Improve posture and alignment with patient to demonstrate improved upright posture with improve position of scapulae along the thoracic wall 06/12/2019  Time  6    Period  Weeks    Status  New      PT LONG TERM GOAL #2   Title  Increase AROM Lt shoulder to equal or greater than AROM Rt shoulder 06/12/2019    Time  6    Period  Weeks    Status  New      PT LONG TERM GOAL #3   Title  Patient reports ability to use Lt UE for funcitonal activities  and lie on Lt side for short periods of time with minimal pain 06/12/2019    Time  6    Period  Weeks    Status  New      PT LONG TERM GOAL #4   Title  Independent in HEP 06/12/2019    Time  6    Period  Weeks    Status  New      PT LONG TERM GOAL #5   Title  Improve FOTO to </= 36% limitation 06/12/2019    Time  6    Period  Weeks    Status  New             Plan - 05/01/19 0850    Clinical Impression Statement  Emma Craig presents with history of Lt shoulder pain for the past year with onset of symptoms following shigles injection 05/27/18. Symptoms have gradually increased and she is now having constant pain with variable intensity related to functional activities and some positions of shoulder. Patient has poor posture and alignment; limited shoulder ROM Lt > Rt; poor scapular stability and control; pain with resistive testing; pain with functional activities; difficulty sleeping and lying on Lt side; decresaed ADL's and functional activities involving Lt UE> Patient will benefit from PT to address problems identified.     Stability/Clinical Decision Making  Stable/Uncomplicated    Clinical Decision Making  Low    Rehab Potential  Good    PT Frequency  2x / week    PT Duration  6 weeks    PT Treatment/Interventions  Patient/family education;ADLs/Self Care Home Management;Cryotherapy;Electrical Stimulation;Iontophoresis 4mg /ml Dexamethasone;Moist Heat;Ultrasound;Therapeutic activities;Therapeutic exercise;Neuromuscular re-education;Dry needling;Manual techniques    PT Next Visit Plan  review HEP; progress with stretching for Lt shoulder(ER/IR/ext/fles); manual work Lt shoudler girdle including joint mobs; modalities as indicated     PT Home Exercise Plan  Access Code: [email or web address]     Consulted and Agree with Plan of Care  Patient       Patient will benefit from skilled therapeutic intervention in order to improve the following deficits and impairments:  Pain, Postural dysfunction,  Improper body mechanics, Increased fascial restricitons, Hypomobility, Decreased strength, Decreased range of motion, Decreased mobility, Decreased activity tolerance, Impaired UE functional use  Visit Diagnosis: Chronic left shoulder pain - Plan: PT plan of care cert/re-cert  Abnormal posture - Plan: PT plan of care cert/re-cert  Muscle weakness (generalized) - Plan: PT plan of care cert/re-cert  Other symptoms and signs involving the musculoskeletal system - Plan: PT plan of care cert/re-cert     Problem List Patient Active Problem List   Diagnosis Date Noted  . Osteoporosis 08/09/2018  . DJD of right AC (acromioclavicular) joint 04/01/2017  . Rotator cuff tendonitis, right 04/01/2017  . Fatty liver 03/22/2016  . CKD (chronic kidney disease) stage 3, GFR 30-59 ml/min (HCC) 03/16/2016  . Hypothyroidism 11/01/2008  . Obesity 08/27/2008  . SLEEP DISORDER 01/17/2008  . Diabetes mellitus without complication ([place]) 58/52/7782  . HYPERLIPIDEMIA 09/14/2006  .  Essential hypertension 09/14/2006    Emma Craig Nilda Simmer PT, MPH  05/01/2019, 10:15 AM  [facility] [place], [place], [address] Phone: [phone]   Fax:  [phone]  Name: Emma Craig MRN: [id number] Date of Birth: Mar 23, 1951

## 2019-05-03 ENCOUNTER — Ambulatory Visit (INDEPENDENT_AMBULATORY_CARE_PROVIDER_SITE_OTHER): Payer: Medicare Other | Admitting: Rehabilitative and Restorative Service Providers"

## 2019-05-03 ENCOUNTER — Other Ambulatory Visit: Payer: Self-pay

## 2019-05-03 ENCOUNTER — Encounter: Payer: Self-pay | Admitting: Rehabilitative and Restorative Service Providers"

## 2019-05-03 DIAGNOSIS — R293 Abnormal posture: Secondary | ICD-10-CM | POA: Diagnosis not present

## 2019-05-03 DIAGNOSIS — M25512 Pain in left shoulder: Secondary | ICD-10-CM | POA: Diagnosis not present

## 2019-05-03 DIAGNOSIS — M6281 Muscle weakness (generalized): Secondary | ICD-10-CM | POA: Diagnosis not present

## 2019-05-03 DIAGNOSIS — M25511 Pain in right shoulder: Secondary | ICD-10-CM

## 2019-05-03 DIAGNOSIS — G8929 Other chronic pain: Secondary | ICD-10-CM

## 2019-05-03 DIAGNOSIS — R29898 Other symptoms and signs involving the musculoskeletal system: Secondary | ICD-10-CM

## 2019-05-03 NOTE — Therapy (Signed)
[place], [place], [address] Phone: [phone]   Fax:  [phone]  Physical Therapy Treatment  Patient Details  Name: Emma Craig MRN: [id number] Date of Birth: Mar 30, 1951 Referring Provider (PT): Dr Lynne Leader    Encounter Date: 05/03/2019  PT End of Session - 05/03/19 0808    Visit Number  2    Number of Visits  12    Date for PT Re-Evaluation  06/12/19    PT Start Time  0805    PT Stop Time  0858    PT Time Calculation (min)  53 min    Activity Tolerance  Patient tolerated treatment well       Past Medical History:  Diagnosis Date  . Chronic kidney disease, stage 2, mildly decreased GFR   . Diabetes mellitus   . Heavy menses   . Hyperlipidemia   . Hypertension   . Obesity     Past Surgical History:  Procedure Laterality Date  . CATARACT EXTRACTION  1-09   right   . CATARACT EXTRACTION  09/2011   left   . CHOLECYSTECTOMY     laproscopic  . rt elbow screws placed  10-82    There were no vitals filed for this visit.  Subjective Assessment - 05/03/19 0809    Subjective  Lt shoulder is sore. She has done her exercises and got a pulley for home.     Currently in Pain?  Yes    Pain Score  5     Pain Location  Shoulder    Pain Orientation  Left    Pain Descriptors / Indicators  Sore;Aching;Dull;Nagging;Sharp   sharp at times                       [facility] Adult PT Treatment/Exercise - 05/03/19 0001      Shoulder Exercises: Standing   External Rotation  AAROM;Left;5 reps   10 sec hold - w/ cane -keeping elbow at side    Internal Rotation  AAROM;Left;5 reps   hands behind back sliding cane to Rt to initiate IR   Extension  AAROM;Left;5 reps   w/ cane    Other Standing Exercises  axial extension 10 sec x 5; scap squeeze 10 sec x 5; L's x 10' W's x 10 with noodle       Shoulder Exercises: Pulleys   Flexion  --   10 sec hold x 10 reps    Scaption  --   10 sec  hold x 10 reps      Moist Heat Therapy   Number Minutes Moist Heat  15 Minutes    Moist Heat Location  Shoulder      Electrical Stimulation   Electrical Stimulation Location  Lt shoulder girdle     Electrical Stimulation Action  IFC    Electrical Stimulation Parameters  to tolerance     Electrical Stimulation Goals  Pain;Tone      Manual Therapy   Manual therapy comments  pt supine     Joint Mobilization  joint mobilization GH joint     Soft tissue mobilization  Lt shoulder girdle - pecs; upper trap; leveator; deltiod; teres; biceps; working through the clavicular area into first rib and lateral cervical musculature     Myofascial Release  pecs     Scapular Mobilization  Lt     Passive ROM  Lt shoulder flexion; extension; ER; elbow flexion  Manual Traction  through long axis Lt UE              PT Education - 05/03/19 0852    Education Details  HEP     Person(s) Educated  Patient    Methods  Explanation;Demonstration;Tactile cues;Verbal cues;Handout    Comprehension  Verbalized understanding;Returned demonstration;Verbal cues required;Tactile cues required          PT Long Term Goals - 05/01/19 1010      PT LONG TERM GOAL #1   Title  Improve posture and alignment with patient to demonstrate improved upright posture with improve position of scapulae along the thoracic wall 06/12/2019    Time  6    Period  Weeks    Status  New      PT LONG TERM GOAL #2   Title  Increase AROM Lt shoulder to equal or greater than AROM Rt shoulder 06/12/2019    Time  6    Period  Weeks    Status  New      PT LONG TERM GOAL #3   Title  Patient reports ability to use Lt UE for funcitonal activities and lie on Lt side for short periods of time with minimal pain 06/12/2019    Time  6    Period  Weeks    Status  New      PT LONG TERM GOAL #4   Title  Independent in HEP 06/12/2019    Time  6    Period  Weeks    Status  New      PT LONG TERM GOAL #5   Title  Improve FOTO to </=  36% limitation 06/12/2019    Time  6    Period  Weeks    Status  New            Plan - 05/03/19 0809    Clinical Impression Statement  Patient is working on exercises at home. Tolerated manual work well. Continues to be tightn with restricted joint mobility/ROM Lt shoulder.     Stability/Clinical Decision Making  Stable/Uncomplicated    Rehab Potential  Good    PT Frequency  2x / week    PT Duration  6 weeks    PT Treatment/Interventions  Patient/family education;ADLs/Self Care Home Management;Cryotherapy;Electrical Stimulation;Iontophoresis 4mg /ml Dexamethasone;Moist Heat;Ultrasound;Therapeutic activities;Therapeutic exercise;Neuromuscular re-education;Dry needling;Manual techniques    PT Next Visit Plan  review HEP; continue stretching for Lt shoulder(ER/IR/ext/fles); assess response to manual work Lt shoulder girdle; continue PROM and joint mobs; modalities as indicated     PT Home Exercise Plan  Access Code: [email or web address]     Consulted and Agree with Plan of Care  Patient       Patient will benefit from skilled therapeutic intervention in order to improve the following deficits and impairments:  Pain, Postural dysfunction, Improper body mechanics, Increased fascial restricitons, Hypomobility, Decreased strength, Decreased range of motion, Decreased mobility, Decreased activity tolerance, Impaired UE functional use  Visit Diagnosis: Chronic left shoulder pain  Abnormal posture  Muscle weakness (generalized)  Other symptoms and signs involving the musculoskeletal system  Chronic right shoulder pain     Problem List Patient Active Problem List   Diagnosis Date Noted  . Osteoporosis 08/09/2018  . DJD of right AC (acromioclavicular) joint 04/01/2017  . Rotator cuff tendonitis, right 04/01/2017  . Fatty liver 03/22/2016  . CKD (chronic kidney disease) stage 3, GFR 30-59 ml/min (HCC) 03/16/2016  . Hypothyroidism 11/01/2008  . Obesity 08/27/2008  . SLEEP DISORDER  01/17/2008   . Diabetes mellitus without complication ([place]) 93/57/0177  . HYPERLIPIDEMIA 09/14/2006  . Essential hypertension 09/14/2006    Linah Klapper Nilda Simmer PT, MPH  05/03/2019, 8:52 AM  [facility] [place], [place], [address] Phone: [phone]   Fax:  [phone]  Name: Emma Craig MRN: [id number] Date of Birth: 1951-04-28

## 2019-05-03 NOTE — Patient Instructions (Signed)
Access Code: [email or web address]  URL: [email or web address]  Date: 05/03/2019  Prepared by: Gillermo Murdoch   Exercises  Seated Shoulder Flexion AAROM with Pulley Behind - 10 reps - 1 sets - 10 sec hold - 2x daily - 7x weekly  Seated Shoulder Scaption AAROM with Pulley at Side - 10 reps - 1 sets - 10 sec hold - 2x daily - 7x weekly  Seated Cervical Retraction - 10 reps - 1 sets - 3x daily - 7x weekly  Standing Scapular Retraction - 10 reps - 1 sets - 10 hold - 3x daily - 7x weekly  Shoulder External Rotation and Scapular Retraction - 10 reps - 1 sets - hold - 3x daily - 7x weekly  Standing Scapular Retraction in Abduction - 10 reps - 1 sets - 3x daily - 7x weekly  Standing 'L' Stretch at Counter - 3-5 reps - 1 sets - 10 sec hold - 2x daily - 7x weekly   Today  Standing Shoulder External Rotation AAROM with Dowel - 5 reps - 1 sets - 10sec hold - 2-3x daily - 7x weekly  Standing Shoulder Internal Rotation AAROM with Dowel - 5 reps - 1 sets - 10sec hold - 2-3x daily - 7x weekly  Standing Bilateral Shoulder Internal Rotation AAROM with Dowel - 5 reps - 1 sets - 10sec hold - 2-3x daily - 7x weekly  Standing Shoulder Extension with Dowel - 5 reps - 1 sets - 10 sec hold - 2-3x daily - 7x weekly

## 2019-05-08 ENCOUNTER — Encounter: Payer: Self-pay | Admitting: Rehabilitative and Restorative Service Providers"

## 2019-05-08 ENCOUNTER — Ambulatory Visit (INDEPENDENT_AMBULATORY_CARE_PROVIDER_SITE_OTHER): Payer: Medicare Other | Admitting: Rehabilitative and Restorative Service Providers"

## 2019-05-08 ENCOUNTER — Other Ambulatory Visit: Payer: Self-pay

## 2019-05-08 DIAGNOSIS — R29898 Other symptoms and signs involving the musculoskeletal system: Secondary | ICD-10-CM | POA: Diagnosis not present

## 2019-05-08 DIAGNOSIS — M25512 Pain in left shoulder: Secondary | ICD-10-CM | POA: Diagnosis not present

## 2019-05-08 DIAGNOSIS — R293 Abnormal posture: Secondary | ICD-10-CM | POA: Diagnosis not present

## 2019-05-08 DIAGNOSIS — M6281 Muscle weakness (generalized): Secondary | ICD-10-CM

## 2019-05-08 DIAGNOSIS — G8929 Other chronic pain: Secondary | ICD-10-CM | POA: Diagnosis not present

## 2019-05-08 NOTE — Patient Instructions (Signed)
Access Code: [email or web address]  URL: [email or web address]  Date: 05/08/2019  Prepared by: Gillermo Murdoch   Exercises  Seated Shoulder Flexion AAROM with Pulley Behind - 10 reps - 1 sets - 10 sec hold - 2x daily - 7x weekly  Seated Shoulder Scaption AAROM with Pulley at Side - 10 reps - 1 sets - 10 sec hold - 2x daily - 7x weekly  Seated Cervical Retraction - 10 reps - 1 sets - 3x daily - 7x weekly  Standing Scapular Retraction - 10 reps - 1 sets - 10 hold - 3x daily - 7x weekly  Shoulder External Rotation and Scapular Retraction - 10 reps - 1 sets - hold - 3x daily - 7x weekly  Standing Scapular Retraction in Abduction - 10 reps - 1 sets - 3x daily - 7x weekly  Standing 'L' Stretch at Counter - 3-5 reps - 1 sets - 10 sec hold - 2x daily - 7x weekly  Standing Shoulder External Rotation AAROM with Dowel - 5 reps - 1 sets - 10sec hold - 2-3x daily - 7x weekly  Standing Shoulder Internal Rotation AAROM with Dowel - 5 reps - 1 sets - 10sec hold - 2-3x daily - 7x weekly   Today  Standing Bilateral Shoulder Internal Rotation AAROM with Dowel - 5 reps - 1 sets - 10sec hold - 2-3x daily - 7x weekly  Standing Shoulder Extension with Dowel - 5 reps - 1 sets - 10 sec hold - 2-3x daily - 7x weekly  Standing Shoulder Internal Rotation AAROM with Dowel - 3 reps - 1 sets - 10sec hold - 2x daily - 7x weekly  Standing Shoulder Internal Rotation Stretch with Dowel - 3 reps - 1 sets - 10sec hold - 2x daily - 7x weekly  Standing Shoulder External Rotation Stretch in Doorway - 3 reps - 1 sets - 30 sec hold - 2x daily - 7x weekly  Standing Bilateral Low Shoulder Row with Anchored Resistance - 10 reps - 1-2 sets - 2-3 sec hold - 2x daily - 7x weekly  Supine Chest Stretch with Elbows Bent - 3 reps - 1 sets - 30 sec hold - 2x daily - 7x weekly

## 2019-05-08 NOTE — Therapy (Signed)
[facility] [place], [place], [address] Phone: [phone]   Fax:  [phone]  Patient Details  Name: Emma Craig MRN: [id number] Date of Birth: 12-28-1950 Referring Provider:  Gregor Hams, MD  Encounter Date: 05/08/2019   [name] 05/08/2019, 8:53 AM  [facility] [place], [place], [address] Phone: [phone]   Fax:  [phone]

## 2019-05-08 NOTE — Therapy (Signed)
[place], [place], [address] Phone: [phone]   Fax:  [phone]  Physical Therapy Treatment  Patient Details  Name: Emma Craig MRN: [id number] Date of Birth: 01/27/1951 Referring Provider (PT): Dr Lynne Leader    Encounter Date: 05/08/2019  PT End of Session - 05/08/19 0808    Visit Number  3    Number of Visits  12    Date for PT Re-Evaluation  06/12/19    PT Start Time  0804    PT Stop Time  0900    PT Time Calculation (min)  56 min    Activity Tolerance  Patient tolerated treatment well       Past Medical History:  Diagnosis Date  . Chronic kidney disease, stage 2, mildly decreased GFR   . Diabetes mellitus   . Heavy menses   . Hyperlipidemia   . Hypertension   . Obesity     Past Surgical History:  Procedure Laterality Date  . CATARACT EXTRACTION  1-09   right   . CATARACT EXTRACTION  09/2011   left   . CHOLECYSTECTOMY     laproscopic  . rt elbow screws placed  10-82    There were no vitals filed for this visit.  Subjective Assessment - 05/08/19 0809    Subjective  Lt shoulder is still "sore" can tell she is getting arm up a little higher but it still hurts and she can't sleep on that Lt side     Currently in Pain?  Yes    Pain Score  3     Pain Location  Shoulder    Pain Orientation  Left    Pain Descriptors / Indicators  Sore    Pain Type  Chronic pain         [facility] PT Assessment - 05/08/19 0001      Assessment   Medical Diagnosis  Lt shoulder dysfunction     Referring Provider (PT)  Dr Lynne Leader     Onset Date/Surgical Date  05/27/18    Hand Dominance  Right    Next MD Visit  05/25/2019    Prior Therapy  here for Rt shoulder pain 2019       AROM   Right Shoulder Extension  42 Degrees    Right Shoulder Flexion  146 Degrees    Right Shoulder ABduction  154 Degrees    Right Shoulder Internal Rotation  24 Degrees    Right Shoulder External Rotation  89  Degrees    Left Shoulder Extension  40 Degrees    Left Shoulder Flexion  138 Degrees    Left Shoulder ABduction  143 Degrees    Left Shoulder Internal Rotation  16 Degrees    Left Shoulder External Rotation  65 Degrees                   [facility] Adult PT Treatment/Exercise - 05/08/19 0001      Shoulder Exercises: Standing   External Rotation  AAROM;Left;5 reps   10 sec hold - w/ cane -keeping elbow at side    External Rotation Limitations  added standing at doorway elbow at 90 deg flexion turning to stretch into ER 30 sec x 3 reps     Internal Rotation  AAROM;Left;5 reps   hands behind back sliding cane to Rt to initiate IR   Internal Rotation Limitations  added IR with stretch out strap 10 sec hold x 5 reps  Extension  AAROM;Left;5 reps   w/ cane    Row  Strengthening;Both;10 reps;Theraband    Theraband Level (Shoulder Row)  Level 3 (Green)      Shoulder Exercises: Pulleys   Flexion  --   10 sec hold x 10 reps    Scaption  --   10 sec hold x 10 reps      Moist Heat Therapy   Number Minutes Moist Heat  15 Minutes    Moist Heat Location  Shoulder      Electrical Stimulation   Electrical Stimulation Location  Lt shoulder girdle Lt triceps to elbow     Electrical Stimulation Action  IFC    Electrical Stimulation Parameters  to tolerance     Electrical Stimulation Goals  Pain;Tone      Manual Therapy   Manual therapy comments  pt sidelying and supine     Joint Mobilization  joint mobilization GH joint     Soft tissue mobilization  Lt shoulder girdle - pecs; upper trap; leveator; deltoid; teres    Scapular Mobilization  Lt     Passive ROM  Lt shoulder flexion; extension; ER; IR in sidelying hand on hip      Manual Traction  through long axis Lt UE              PT Education - 05/08/19 0852    Education Details  HEP     Person(s) Educated  Patient    Methods  Explanation;Demonstration;Tactile cues;Verbal cues;Handout    Comprehension  Verbalized  understanding;Returned demonstration;Verbal cues required;Tactile cues required          PT Long Term Goals - 05/01/19 1010      PT LONG TERM GOAL #1   Title  Improve posture and alignment with patient to demonstrate improved upright posture with improve position of scapulae along the thoracic wall 06/12/2019    Time  6    Period  Weeks    Status  New      PT LONG TERM GOAL #2   Title  Increase AROM Lt shoulder to equal or greater than AROM Rt shoulder 06/12/2019    Time  6    Period  Weeks    Status  New      PT LONG TERM GOAL #3   Title  Patient reports ability to use Lt UE for funcitonal activities and lie on Lt side for short periods of time with minimal pain 06/12/2019    Time  6    Period  Weeks    Status  New      PT LONG TERM GOAL #4   Title  Independent in HEP 06/12/2019    Time  6    Period  Weeks    Status  New      PT LONG TERM GOAL #5   Title  Improve FOTO to </= 36% limitation 06/12/2019    Time  6    Period  Weeks    Status  New            Plan - 05/08/19 0816    Clinical Impression Statement  --    Rehab Potential  --    PT Frequency  --    PT Duration  --    PT Treatment/Interventions  --    PT Next Visit Plan  --    PT Home Exercise Plan  --       Patient will benefit from skilled therapeutic intervention in order  to improve the following deficits and impairments:  Pain, Postural dysfunction, Improper body mechanics, Increased fascial restricitons, Hypomobility, Decreased strength, Decreased range of motion, Decreased mobility, Decreased activity tolerance, Impaired UE functional use  Visit Diagnosis: Chronic left shoulder pain  Abnormal posture  Muscle weakness (generalized)  Other symptoms and signs involving the musculoskeletal system     Problem List Patient Active Problem List   Diagnosis Date Noted  . Osteoporosis 08/09/2018  . DJD of right AC (acromioclavicular) joint 04/01/2017  . Rotator cuff tendonitis, right  04/01/2017  . Fatty liver 03/22/2016  . CKD (chronic kidney disease) stage 3, GFR 30-59 ml/min (HCC) 03/16/2016  . Hypothyroidism 11/01/2008  . Obesity 08/27/2008  . SLEEP DISORDER 01/17/2008  . Diabetes mellitus without complication ([place]) 38/17/7116  . HYPERLIPIDEMIA 09/14/2006  . Essential hypertension 09/14/2006    Augie Vane Nilda Simmer PT, MPH  05/08/2019, 8:55 AM  [facility] [place], [place], [address] Phone: [phone]   Fax:  [phone]  Name: Emma Craig MRN: [id number] Date of Birth: 21-Mar-1951

## 2019-05-10 ENCOUNTER — Ambulatory Visit (INDEPENDENT_AMBULATORY_CARE_PROVIDER_SITE_OTHER): Payer: Medicare Other | Admitting: Physical Therapy

## 2019-05-10 ENCOUNTER — Other Ambulatory Visit: Payer: Self-pay

## 2019-05-10 DIAGNOSIS — M25512 Pain in left shoulder: Secondary | ICD-10-CM | POA: Diagnosis not present

## 2019-05-10 DIAGNOSIS — R293 Abnormal posture: Secondary | ICD-10-CM

## 2019-05-10 DIAGNOSIS — M6281 Muscle weakness (generalized): Secondary | ICD-10-CM | POA: Diagnosis not present

## 2019-05-10 DIAGNOSIS — R29898 Other symptoms and signs involving the musculoskeletal system: Secondary | ICD-10-CM | POA: Diagnosis not present

## 2019-05-10 DIAGNOSIS — G8929 Other chronic pain: Secondary | ICD-10-CM

## 2019-05-10 NOTE — Therapy (Signed)
[place], [place], [address] Phone: [phone]   Fax:  [phone]  Physical Therapy Treatment  Patient Details  Name: Nalda Shackleford MRN: [id number] Date of Birth: 1951/06/08 Referring Provider (PT): Dr Lynne Leader    Encounter Date: 05/10/2019  PT End of Session - 05/10/19 0808    Visit Number  4    Number of Visits  12    Date for PT Re-Evaluation  06/12/19    PT Start Time  0806    PT Stop Time  0856    PT Time Calculation (min)  50 min    Activity Tolerance  Patient tolerated treatment well    Behavior During Therapy  [facility] for tasks assessed/performed       Past Medical History:  Diagnosis Date  . Chronic kidney disease, stage 2, mildly decreased GFR   . Diabetes mellitus   . Heavy menses   . Hyperlipidemia   . Hypertension   . Obesity     Past Surgical History:  Procedure Laterality Date  . CATARACT EXTRACTION  1-09   right   . CATARACT EXTRACTION  09/2011   left   . CHOLECYSTECTOMY     laproscopic  . rt elbow screws placed  10-82    There were no vitals filed for this visit.  Subjective Assessment - 05/10/19 0811    Subjective  Pt reporting improved mobility in Lt shoulder over the last few sessions, "I can reach my hand higher".    Patient Stated Goals  get rid of pain; get arm to move    Currently in Pain?  Yes    Pain Score  2     Pain Location  Shoulder    Pain Orientation  Left    Pain Descriptors / Indicators  Aching    Aggravating Factors   reaching up or to the side    Pain Relieving Factors  rest         [facility] PT Assessment - 05/10/19 0001      Assessment   Medical Diagnosis  Lt shoulder dysfunction     Referring Provider (PT)  Dr Lynne Leader     Onset Date/Surgical Date  05/27/18    Hand Dominance  Right    Next MD Visit  05/25/2019    Prior Therapy  here for Rt shoulder pain 2019        [facility] Adult PT Treatment/Exercise - 05/10/19 0001      Shoulder  Exercises: Supine   External Rotation  AAROM;Left;10 reps   cane   Flexion  AAROM;Both;5 reps   cane; 10 sec hold     Shoulder Exercises: Standing   Internal Rotation  AAROM;Left;5 reps   hands behind back sliding cane to Rt to initiate IR   Extension  AAROM;Left;5 reps   w/ cane      Shoulder Exercises: Pulleys   Flexion  --   10 sec hold x 10 reps    Scaption  --   10 sec hold x 10 reps      Shoulder Exercises: Stretch   Internal Rotation Stretch  5 reps   20 sec; strap   Table Stretch - Flexion  10 seconds;4 reps   hands on sink step back to stretch      Moist Heat Therapy   Number Minutes Moist Heat  15 Minutes    Moist Heat Location  Shoulder      Electrical Stimulation  Electrical Stimulation Location  Lt shoulder girdle Lt triceps to elbow     Electrical Stimulation Action  IFC    Electrical Stimulation Parameters  intensity to tolerance     Electrical Stimulation Goals  Tone;Pain      Manual Therapy   Manual therapy comments  pt supine    Joint Mobilization  Lt GH joint, AP grade 1-2    Soft tissue mobilization  Lt shoulder lat, pec, bicep. TPR to Lt subscap with passive and active IR/E    Passive ROM  Lt shoulder ext, horiz abdct, scaption, ER (some guarding)                   PT Long Term Goals - 05/01/19 1010      PT LONG TERM GOAL #1   Title  Improve posture and alignment with patient to demonstrate improved upright posture with improve position of scapulae along the thoracic wall 06/12/2019    Time  6    Period  Weeks    Status  New      PT LONG TERM GOAL #2   Title  Increase AROM Lt shoulder to equal or greater than AROM Rt shoulder 06/12/2019    Time  6    Period  Weeks    Status  New      PT LONG TERM GOAL #3   Title  Patient reports ability to use Lt UE for funcitonal activities and lie on Lt side for short periods of time with minimal pain 06/12/2019    Time  6    Period  Weeks    Status  New      PT LONG TERM GOAL #4   Title   Independent in HEP 06/12/2019    Time  6    Period  Weeks    Status  New      PT LONG TERM GOAL #5   Title  Improve FOTO to </= 36% limitation 06/12/2019    Time  6    Period  Weeks    Status  New            Plan - 05/10/19 0854    Clinical Impression Statement  Pt reporting improved functional mobility in Lt shoulder since beginning therapy.  Lt shoulder IR, ER, and scaption remain limited.  Pt very tender in subscap and latissimus on Lt; pt very guarded with manual therapy to this area. Pt progressing well towards therapy goals.    PT Frequency  2x / week    PT Duration  6 weeks    PT Treatment/Interventions  Electrical Stimulation;Cryotherapy;ADLs/Self Care Home Management;Iontophoresis 4mg /ml Dexamethasone;Moist Heat;Ultrasound;Therapeutic activities;Therapeutic exercise;Neuromuscular re-education;Patient/family education;Manual techniques;Passive range of motion;Dry needling    PT Next Visit Plan  continue progression ROM and manual therapy for LUE    Consulted and Agree with Plan of Care  Patient       Patient will benefit from skilled therapeutic intervention in order to improve the following deficits and impairments:  Increased fascial restricitons, Improper body mechanics, Pain, Hypermobility, Impaired UE functional use, Hypomobility, Decreased strength, Decreased range of motion, Decreased activity tolerance, Postural dysfunction  Visit Diagnosis: Chronic left shoulder pain  Abnormal posture  Muscle weakness (generalized)  Other symptoms and signs involving the musculoskeletal system     Problem List Patient Active Problem List   Diagnosis Date Noted  . Osteoporosis 08/09/2018  . DJD of right AC (acromioclavicular) joint 04/01/2017  . Rotator cuff tendonitis, right 04/01/2017  . Fatty  liver 03/22/2016  . CKD (chronic kidney disease) stage 3, GFR 30-59 ml/min (HCC) 03/16/2016  . Hypothyroidism 11/01/2008  . Obesity 08/27/2008  . SLEEP DISORDER 01/17/2008  .  Diabetes mellitus without complication ([place]) 49/75/3005  . HYPERLIPIDEMIA 09/14/2006  . Essential hypertension 09/14/2006   Kerin Perna, PTA 05/10/19 9:12 AM  [facility] [place], [place], [address] Phone: [phone]   Fax:  [phone]  Name: Stormie Ventola MRN: [id number] Date of Birth: [birth year]

## 2019-05-15 ENCOUNTER — Ambulatory Visit (INDEPENDENT_AMBULATORY_CARE_PROVIDER_SITE_OTHER): Payer: Medicare Other | Admitting: Physical Therapy

## 2019-05-15 ENCOUNTER — Other Ambulatory Visit: Payer: Self-pay

## 2019-05-15 ENCOUNTER — Encounter: Payer: Self-pay | Admitting: Physical Therapy

## 2019-05-15 DIAGNOSIS — M25512 Pain in left shoulder: Secondary | ICD-10-CM | POA: Diagnosis not present

## 2019-05-15 DIAGNOSIS — R29898 Other symptoms and signs involving the musculoskeletal system: Secondary | ICD-10-CM

## 2019-05-15 DIAGNOSIS — M6281 Muscle weakness (generalized): Secondary | ICD-10-CM | POA: Diagnosis not present

## 2019-05-15 DIAGNOSIS — G8929 Other chronic pain: Secondary | ICD-10-CM

## 2019-05-15 DIAGNOSIS — R293 Abnormal posture: Secondary | ICD-10-CM

## 2019-05-15 NOTE — Patient Instructions (Signed)

## 2019-05-15 NOTE — Therapy (Signed)
[place], [place], [address] Phone: [phone]   Fax:  [phone]  Physical Therapy Treatment  Patient Details  Name: Emma Craig MRN: [id number] Date of Birth: 1951-07-01 Referring Provider (PT): Dr Lynne Leader    Encounter Date: 05/15/2019  PT End of Session - 05/15/19 0813    Visit Number  5    Number of Visits  12    Date for PT Re-Evaluation  06/12/19    PT Start Time  0802    PT Stop Time  0848    PT Time Calculation (min)  46 min    Activity Tolerance  Patient tolerated treatment well    Behavior During Therapy  [facility] for tasks assessed/performed       Past Medical History:  Diagnosis Date  . Chronic kidney disease, stage 2, mildly decreased GFR   . Diabetes mellitus   . Heavy menses   . Hyperlipidemia   . Hypertension   . Obesity     Past Surgical History:  Procedure Laterality Date  . CATARACT EXTRACTION  1-09   right   . CATARACT EXTRACTION  09/2011   left   . CHOLECYSTECTOMY     laproscopic  . rt elbow screws placed  10-82    There were no vitals filed for this visit.  Subjective Assessment - 05/15/19 0811    Subjective  Pt reports no new changes since last visit.  continued soreness in Lt shoulder.  She continues to have difficulty getting seat belt on with Lt arm.    Patient Stated Goals  get rid of pain; get arm to move    Currently in Pain?  Yes    Pain Score  5     Pain Location  Shoulder    Pain Orientation  Left    Pain Descriptors / Indicators  Aching    Aggravating Factors   moving arm in certain ways    Pain Relieving Factors  not laying on shoulder, not moving it.         [facility] PT Assessment - 05/15/19 0001      Assessment   Medical Diagnosis  Lt shoulder dysfunction     Referring Provider (PT)  Dr Lynne Leader     Onset Date/Surgical Date  05/27/18    Hand Dominance  Right    Next MD Visit  05/25/2019    Prior Therapy  here for Rt shoulder pain  2019        [facility] Adult PT Treatment/Exercise - 05/15/19 0001      Shoulder Exercises: Supine   External Rotation  AAROM;Left;5 reps   cane   Flexion  AAROM;Both;5 reps   cane; 10 sec hold   ABduction  AAROM;Left;5 reps   cane   Other Supine Exercises  back of hands to forehead, elbows pressed out to side x 5 sec x 5 reps      Shoulder Exercises: Standing   Internal Rotation  AAROM;Left;10 reps   hands behind back sliding cane to Rt to initiate IR   Other Standing Exercises  wall ladder with LUE x 3 reps in flexion, 3 reps in scaption       Shoulder Exercises: Pulleys   Flexion  --   10 sec hold x 10 reps    Scaption  --   10 sec hold x 10 reps      Shoulder Exercises: ROM/Strengthening   UBE (Upper Arm Bike)  L1: 1 min  forward, 30 sec backward, Rt arm doing the work, Lt arm going for a ride.       Shoulder Exercises: Stretch   Other Shoulder Stretches  shoulder ext stretch holding door frame, elbows straight, x 30 sec x 2 reps      Moist Heat Therapy   Number Minutes Moist Heat  15 Minutes    Moist Heat Location  Shoulder      Electrical Stimulation   Electrical Stimulation Location  Lt shoulder     Electrical Stimulation Action  IFC    Electrical Stimulation Parameters  intensity to pt tolerance    Electrical Stimulation Goals  Pain;Tone      Manual Therapy   Manual therapy comments  Pt seated with LUE supported;  I strip of sensitive skin Rock tape applied to Lt lateral epicondyle covering tricep up to post shoulder; I strip applied at distal Lt tricep to lateral epicondyle; I strip over posterior Lt deltoid to lateral deltoid.  All strips applied with 25% stretch to decompress tissue and decrease pain.      Soft tissue mobilization  IASTM to Lt tricep, deltoid and posterior shoulder to decrease fascial adhesions and improve ROM             PT Education - 05/15/19 0849    Education Details  ktape info    Person(s) Educated  Patient    Methods   Explanation;Handout    Comprehension  Verbalized understanding          PT Long Term Goals - 05/01/19 1010      PT LONG TERM GOAL #1   Title  Improve posture and alignment with patient to demonstrate improved upright posture with improve position of scapulae along the thoracic wall 06/12/2019    Time  6    Period  Weeks    Status  New      PT LONG TERM GOAL #2   Title  Increase AROM Lt shoulder to equal or greater than AROM Rt shoulder 06/12/2019    Time  6    Period  Weeks    Status  New      PT LONG TERM GOAL #3   Title  Patient reports ability to use Lt UE for funcitonal activities and lie on Lt side for short periods of time with minimal pain 06/12/2019    Time  6    Period  Weeks    Status  New      PT LONG TERM GOAL #4   Title  Independent in HEP 06/12/2019    Time  6    Period  Weeks    Status  New      PT LONG TERM GOAL #5   Title  Improve FOTO to </= 36% limitation 06/12/2019    Time  6    Period  Weeks    Status  New            Plan - 05/15/19 0857    Clinical Impression Statement  Pt making gradual gains in Lt shoulder ROM.  She tolerated all ROM exercises with mild increase in pain; reduced with rest.  Trial of Ktape applied to Lt shoulder to decrease pain, improve proprioception, and decompress tissue.  Progressing towards goals.    PT Frequency  2x / week    PT Duration  6 weeks    PT Treatment/Interventions  Electrical Stimulation;Cryotherapy;ADLs/Self Care Home Management;Iontophoresis 4mg /ml Dexamethasone;Moist Heat;Ultrasound;Therapeutic activities;Therapeutic exercise;Neuromuscular re-education;Patient/family education;Manual techniques;Passive range of motion;Dry needling  PT Next Visit Plan  continue progression ROM and manual therapy for LUE. Assess response to tape.    Consulted and Agree with Plan of Care  Patient       Patient will benefit from skilled therapeutic intervention in order to improve the following deficits and impairments:   Increased fascial restricitons, Improper body mechanics, Pain, Hypermobility, Impaired UE functional use, Hypomobility, Decreased strength, Decreased range of motion, Decreased activity tolerance, Postural dysfunction  Visit Diagnosis: 1. Chronic left shoulder pain   2. Abnormal posture   3. Muscle weakness (generalized)   4. Other symptoms and signs involving the musculoskeletal system        Problem List Patient Active Problem List   Diagnosis Date Noted  . Osteoporosis 08/09/2018  . DJD of right AC (acromioclavicular) joint 04/01/2017  . Rotator cuff tendonitis, right 04/01/2017  . Fatty liver 03/22/2016  . CKD (chronic kidney disease) stage 3, GFR 30-59 ml/min (HCC) 03/16/2016  . Hypothyroidism 11/01/2008  . Obesity 08/27/2008  . SLEEP DISORDER 01/17/2008  . Diabetes mellitus without complication ([place]) 46/65/9935  . HYPERLIPIDEMIA 09/14/2006  . Essential hypertension 09/14/2006   Kerin Perna, PTA 05/15/19 8:59 AM  [facility] [place], [place], [address] Phone: [phone]   Fax:  [phone]  Name: Laurajean Hosek MRN: [id number] Date of Birth: Jul 03, 1951

## 2019-05-18 ENCOUNTER — Other Ambulatory Visit: Payer: Self-pay

## 2019-05-18 ENCOUNTER — Ambulatory Visit (INDEPENDENT_AMBULATORY_CARE_PROVIDER_SITE_OTHER): Payer: Medicare Other | Admitting: Physical Therapy

## 2019-05-18 DIAGNOSIS — R29898 Other symptoms and signs involving the musculoskeletal system: Secondary | ICD-10-CM | POA: Diagnosis not present

## 2019-05-18 DIAGNOSIS — M25512 Pain in left shoulder: Secondary | ICD-10-CM

## 2019-05-18 DIAGNOSIS — M6281 Muscle weakness (generalized): Secondary | ICD-10-CM | POA: Diagnosis not present

## 2019-05-18 DIAGNOSIS — G8929 Other chronic pain: Secondary | ICD-10-CM

## 2019-05-18 DIAGNOSIS — R293 Abnormal posture: Secondary | ICD-10-CM

## 2019-05-18 NOTE — Therapy (Signed)
[place], [place], [address] Phone: [phone]   Fax:  [phone]  Physical Therapy Treatment  Patient Details  Name: Emma Craig MRN: [id number] Date of Birth: December 06, 1950 Referring Provider (PT): Dr Lynne Leader    Encounter Date: 05/18/2019  PT End of Session - 05/18/19 1016    Visit Number  6    Number of Visits  12    Date for PT Re-Evaluation  06/12/19    PT Start Time  1004    PT Stop Time  1045    PT Time Calculation (min)  41 min    Activity Tolerance  Patient tolerated treatment well    Behavior During Therapy  [facility] for tasks assessed/performed       Past Medical History:  Diagnosis Date  . Chronic kidney disease, stage 2, mildly decreased GFR   . Diabetes mellitus   . Heavy menses   . Hyperlipidemia   . Hypertension   . Obesity     Past Surgical History:  Procedure Laterality Date  . CATARACT EXTRACTION  1-09   right   . CATARACT EXTRACTION  09/2011   left   . CHOLECYSTECTOMY     laproscopic  . rt elbow screws placed  10-82    There were no vitals filed for this visit.  Subjective Assessment - 05/18/19 1016    Subjective  "I really liked the tape". She reports she felt it gave her support and helped with swelling.  She has a little more pain in the top of shoulder.    Patient Stated Goals  get rid of pain; get arm to move    Currently in Pain?  Yes    Pain Score  5     Pain Location  Shoulder    Pain Orientation  Left;Anterior;Proximal    Pain Descriptors / Indicators  Sore    Pain Radiating Towards  down into elbow    Aggravating Factors   moving shoulder past comfortable point    Pain Relieving Factors  heat         [facility] PT Assessment - 05/18/19 0001      Assessment   Medical Diagnosis  Lt shoulder dysfunction     Referring Provider (PT)  Dr Lynne Leader     Onset Date/Surgical Date  05/27/18    Hand Dominance  Right    Next MD Visit  05/25/2019    Prior  Therapy  here for Rt shoulder pain 2019       AROM   Left Shoulder Flexion  140 Degrees   standing   Left Shoulder Internal Rotation  --   thumb to L4   Left Shoulder External Rotation  45 Degrees   supine, scaption of 50 deg, supported AAROM with cane       [facility] Adult PT Treatment/Exercise - 05/18/19 0001      Shoulder Exercises: Supine   External Rotation  AAROM;Left;10 reps   cane   Flexion  AAROM;Both;5 reps   cane; 10 sec hold   Other Supine Exercises  back of hands to forehead, elbows press out x 5 reps       Shoulder Exercises: Standing   Extension  AAROM;Left;5 reps   w/ cane    Other Standing Exercises  axial ext with W's x 5 sec hold x 10 reps    Other Standing Exercises  wall ladder with LUE x 4 reps in flexion, 4 reps in scaption  Shoulder Exercises: Pulleys   Flexion  --   10 sec hold x 10 reps    Scaption  --   10 sec hold x 10 reps      Shoulder Exercises: Stretch   Internal Rotation Stretch  3 reps   15 sec; RUE assisting LUE behind back   Other Shoulder Stretches  shoulder ext stretch holding door frame, elbows straight, x 30 sec x 2 reps    Other Shoulder Stretches  midlevel doorway stretch x 20 sec x 2 reps      Modalities   Modalities  --   declined; will use at home     Manual Therapy   Manual therapy comments  Pt seated with LUE supported;  I strip of sensitive skin Rock tape applied to Lt lateral epicondyle covering tricep up to post shoulder; I strip applied at distal Lt tricep to lateral epicondyle; I strip over posterior Lt deltoid to lateral deltoid.  All strips applied with 25% stretch to decompress tissue and decrease pain.      Soft tissue mobilization  IASTM to Lt prox bicep, tricep, deltoid and posterior shoulder to decrease fascial adhesions and improve ROM                  PT Long Term Goals - 05/01/19 1010      PT LONG TERM GOAL #1   Title  Improve posture and alignment with patient to demonstrate improved upright  posture with improve position of scapulae along the thoracic wall 06/12/2019    Time  6    Period  Weeks    Status  New      PT LONG TERM GOAL #2   Title  Increase AROM Lt shoulder to equal or greater than AROM Rt shoulder 06/12/2019    Time  6    Period  Weeks    Status  New      PT LONG TERM GOAL #3   Title  Patient reports ability to use Lt UE for funcitonal activities and lie on Lt side for short periods of time with minimal pain 06/12/2019    Time  6    Period  Weeks    Status  New      PT LONG TERM GOAL #4   Title  Independent in HEP 06/12/2019    Time  6    Period  Weeks    Status  New      PT LONG TERM GOAL #5   Title  Improve FOTO to </= 36% limitation 06/12/2019    Time  6    Period  Weeks    Status  New            Plan - 05/18/19 1523    Clinical Impression Statement  Pt continues to make gradual gains, with reported improvement in functional mobility.  Encouraged pt to work on elbow and shoulder ext as part of HEP, as she still likes to hold elbow flexed, arm across abdomen.  Progressing towards goals.    PT Frequency  2x / week    PT Duration  6 weeks    PT Treatment/Interventions  Electrical Stimulation;Cryotherapy;ADLs/Self Care Home Management;Iontophoresis 4mg /ml Dexamethasone;Moist Heat;Ultrasound;Therapeutic activities;Therapeutic exercise;Neuromuscular re-education;Patient/family education;Manual techniques;Passive range of motion;Dry needling    PT Next Visit Plan  continue progression ROM and manual therapy for LUE. Tape Lt tricep to elbow with elbow in neutral. assess response to 2nd tape application.    Consulted and Agree with Plan of  Care  Patient       Patient will benefit from skilled therapeutic intervention in order to improve the following deficits and impairments:  Increased fascial restricitons, Improper body mechanics, Pain, Hypermobility, Impaired UE functional use, Hypomobility, Decreased strength, Decreased range of motion, Decreased  activity tolerance, Postural dysfunction  Visit Diagnosis: 1. Chronic left shoulder pain   2. Abnormal posture   3. Muscle weakness (generalized)   4. Other symptoms and signs involving the musculoskeletal system        Problem List Patient Active Problem List   Diagnosis Date Noted  . Osteoporosis 08/09/2018  . DJD of right AC (acromioclavicular) joint 04/01/2017  . Rotator cuff tendonitis, right 04/01/2017  . Fatty liver 03/22/2016  . CKD (chronic kidney disease) stage 3, GFR 30-59 ml/min (HCC) 03/16/2016  . Hypothyroidism 11/01/2008  . Obesity 08/27/2008  . SLEEP DISORDER 01/17/2008  . Diabetes mellitus without complication ([place]) 72/82/0601  . HYPERLIPIDEMIA 09/14/2006  . Essential hypertension 09/14/2006   Kerin Perna, PTA 05/18/19 3:27 PM  [place], [place], [address] Phone: [phone]   Fax:  [phone]  Name: Emma Craig MRN: [id number] Date of Birth: Sep 04, 1951

## 2019-05-19 ENCOUNTER — Other Ambulatory Visit: Payer: Self-pay | Admitting: Family Medicine

## 2019-05-21 ENCOUNTER — Encounter: Payer: Self-pay | Admitting: Rehabilitative and Restorative Service Providers"

## 2019-05-21 ENCOUNTER — Ambulatory Visit (INDEPENDENT_AMBULATORY_CARE_PROVIDER_SITE_OTHER): Payer: Medicare Other | Admitting: Rehabilitative and Restorative Service Providers"

## 2019-05-21 ENCOUNTER — Other Ambulatory Visit: Payer: Self-pay

## 2019-05-21 DIAGNOSIS — R29898 Other symptoms and signs involving the musculoskeletal system: Secondary | ICD-10-CM

## 2019-05-21 DIAGNOSIS — G8929 Other chronic pain: Secondary | ICD-10-CM | POA: Diagnosis not present

## 2019-05-21 DIAGNOSIS — M6281 Muscle weakness (generalized): Secondary | ICD-10-CM | POA: Diagnosis not present

## 2019-05-21 DIAGNOSIS — R293 Abnormal posture: Secondary | ICD-10-CM

## 2019-05-21 DIAGNOSIS — M25512 Pain in left shoulder: Secondary | ICD-10-CM | POA: Diagnosis not present

## 2019-05-21 NOTE — Patient Instructions (Addendum)
Trigger Point Dry Needling  . What is Trigger Point Dry Needling (DN)? o DN is a physical therapy technique used to treat muscle pain and dysfunction. Specifically, DN helps deactivate muscle trigger points (muscle knots).  o A thin filiform needle is used to penetrate the skin and stimulate the underlying trigger point. The goal is for a local twitch response (LTR) to occur and for the trigger point to relax. No medication of any kind is injected during the procedure.   . What Does Trigger Point Dry Needling Feel Like?  o The procedure feels different for each individual patient. Some patients report that they do not actually feel the needle enter the skin and overall the process is not painful. Very mild bleeding may occur. However, many patients feel a deep cramping in the muscle in which the needle was inserted. This is the local twitch response.   Marland Kitchen How Will I feel after the treatment? o Soreness is normal, and the onset of soreness may not occur for a few hours. Typically this soreness does not last longer than two days.  o Bruising is uncommon, however; ice can be used to decrease any possible bruising.  o In rare cases feeling tired or nauseous after the treatment is normal. In addition, your symptoms may get worse before they get better, this period will typically not last longer than 24 hours.   . What Can I do After My Treatment? o Increase your hydration by drinking more water for the next 24 hours. o You may place ice or heat on the areas treated that have become sore, however, do not use heat on inflamed or bruised areas. Heat often brings more relief post needling. o You can continue your regular activities, but vigorous activity is not recommended initially after the treatment for 24 hours. o DN is best combined with other physical therapy such as strengthening, stretching, and other therapies.    IONTOPHORESIS PATIENT PRECAUTIONS & CONTRAINDICATIONS:  . Redness under one or  both electrodes can occur.  This characterized by a uniform redness that usually disappears within 12 hours of treatment. . Small pinhead size blisters may result in response to the drug.  Contact your physician if the problem persists more than 24 hours. . On rare occasions, iontophoresis therapy can result in temporary skin reactions such as rash, inflammation, irritation or burns.  The skin reactions may be the result of individual sensitivity to the ionic solution used, the condition of the skin at the start of treatment, reaction to the materials in the electrodes, allergies or sensitivity to dexamethasone, or a poor connection between the patch and your skin.  Discontinue using iontophoresis if you have any of these reactions and report to your therapist. . Remove the Patch or electrodes if you have any undue sensation of pain or burning during the treatment and report discomfort to your therapist. . Tell your Therapist if you have had known adverse reactions to the application of electrical current. . If using the Patch, the LED light will turn off when treatment is complete and the patch can be removed.  Approximate treatment time is 1-3 hours.  Remove the patch when light goes off or after 6 hours. . The Patch can be worn during normal activity, however excessive motion where the electrodes have been placed can cause poor contact between the skin and the electrode or uneven electrical current resulting in greater risk of skin irritation. Marland Kitchen Keep out of the reach of children.   Marland Kitchen  DO NOT use if you have a cardiac pacemaker or any other electrically sensitive implanted device. . DO NOT use if you have a known sensitivity to dexamethasone. . DO NOT use during Magnetic Resonance Imaging (MRI). . DO NOT use over broken or compromised skin (e.g. sunburn, cuts, or acne) due to the increased risk of skin reaction. . DO NOT SHAVE over the area to be treated:  To establish good contact between the  Patch and the skin, excessive hair may be clipped. . DO NOT place the Patch or electrodes on or over your eyes, directly over your heart, or brain. . DO NOT reuse the Patch or electrodes as this may cause burns to occur. External Rotator Cuff Stretch, Standing    Stand, one hand behind neck, elbow out to side. Lift elbow. Hold _20-30__ seconds.  Repeat __2-3_ times per session. Do _2-3__ sessions per day.   Posterior Capsule Stretch (Passive)    Stand or sit holding under one elbow with opposite hand. Pull arm across chest. Hold ___ seconds. Repeat ___ times per session. Do ___ sessions per day.   Posterior Capsule Stretch (Passive)    Stand or sit holding over top of one elbow with opposite hand. Pull arm across chest. Hold ___ seconds. Repeat ___ times per session. Do ___ sessions per day.   Posterior Capsule-Adduction Stretch (Active)    Stand or sit. Reach one arm across chest. Hold _20-30__ seconds.  Repeat _2-3__ times per session. Do __2-3_ sessions per day.

## 2019-05-21 NOTE — Therapy (Signed)
[place], [place], [address] Phone: [phone]   Fax:  [phone]  Physical Therapy Treatment  Patient Details  Name: Emma Craig MRN: [id number] Date of Birth: 1951-10-03 Referring Provider (PT): Dr Lynne Leader    Encounter Date: 05/21/2019  PT End of Session - 05/21/19 0708    Visit Number  7    Number of Visits  12    Date for PT Re-Evaluation  06/12/19    PT Start Time  0702    PT Stop Time  0758    PT Time Calculation (min)  56 min    Activity Tolerance  Patient tolerated treatment well       Past Medical History:  Diagnosis Date  . Chronic kidney disease, stage 2, mildly decreased GFR   . Diabetes mellitus   . Heavy menses   . Hyperlipidemia   . Hypertension   . Obesity     Past Surgical History:  Procedure Laterality Date  . CATARACT EXTRACTION  1-09   right   . CATARACT EXTRACTION  09/2011   left   . CHOLECYSTECTOMY     laproscopic  . rt elbow screws placed  10-82    There were no vitals filed for this visit.  Subjective Assessment - 05/21/19 0709    Subjective  Continued pain in the same place - not as intense as it was.but still there. Hurts constantly    Currently in Pain?  Yes    Pain Score  4     Pain Location  Shoulder    Pain Orientation  Left    Pain Descriptors / Indicators  Sore    Pain Type  Chronic pain    Pain Radiating Towards  into elbow    Pain Onset  More than a month ago    Pain Frequency  Constant                       [facility] Adult PT Treatment/Exercise - 05/21/19 0001      Shoulder Exercises: Pulleys   Flexion  --   10 sec hold x 10 reps    Scaption  --   10 sec hold x 10 reps      Shoulder Exercises: Stretch   Other Shoulder Stretches  stretch for triceps hand over head; horizontal adduction in varied positions arm across body 20-30 sec x 2-3 reps each     Other Shoulder Stretches  midlevel doorway stretch x 20 sec x 2  reps      Moist Heat Therapy   Number Minutes Moist Heat  15 Minutes    Moist Heat Location  Shoulder      Electrical Stimulation   Electrical Stimulation Location  posterior Lt shoulder to triceps     Electrical Stimulation Action  IFC    Electrical Stimulation Parameters  to tolerance    Electrical Stimulation Goals  Pain;Tone      Iontophoresis   Type of Iontophoresis  Dexamethasone    Location  Lt elbow - triceps tendon area    Dose  134mAmp/1 cc    Time  10 hrs       Manual Therapy   Manual therapy comments  pt prone and sidelying     Joint Mobilization  Lt GH joint, AP grade 1-2    Soft tissue mobilization  deep tissue work posterior shoulder girdle T-spine to axilla to shoulder/triceps to elbow    Scapular  Mobilization  Lt     Passive ROM  stretch Lt UE overhead in sidelying to stretch triceps and lats/teres/lateral shoulder girdle area     Manual Traction  through long axis Lt UE     Kinesiotex  --   long strip post shd to triceps; elbow             PT Education - 05/21/19 0734    Education Details  HEP; DN; ionto    Person(s) Educated  Patient    Methods  Explanation;Demonstration;Tactile cues;Verbal cues;Handout    Comprehension  Verbalized understanding;Returned demonstration;Verbal cues required;Tactile cues required          PT Long Term Goals - 05/01/19 1010      PT LONG TERM GOAL #1   Title  Improve posture and alignment with patient to demonstrate improved upright posture with improve position of scapulae along the thoracic wall 06/12/2019    Time  6    Period  Weeks    Status  New      PT LONG TERM GOAL #2   Title  Increase AROM Lt shoulder to equal or greater than AROM Rt shoulder 06/12/2019    Time  6    Period  Weeks    Status  New      PT LONG TERM GOAL #3   Title  Patient reports ability to use Lt UE for funcitonal activities and lie on Lt side for short periods of time with minimal pain 06/12/2019    Time  6    Period  Weeks     Status  New      PT LONG TERM GOAL #4   Title  Independent in HEP 06/12/2019    Time  6    Period  Weeks    Status  New      PT LONG TERM GOAL #5   Title  Improve FOTO to </= 36% limitation 06/12/2019    Time  6    Period  Weeks    Status  New            Plan - 05/21/19 0708    Clinical Impression Statement  Tightness to palpation through posterior Lt shoulder girdle in ~ T 3/4/5 spinous processes and thoracic paraspinals; teres; lats/triceps. Good response to manual work. May benefit from DN to areas of muscular tightness. Added stretches and trial of ionto.    PT Frequency  2x / week    PT Duration  6 weeks    PT Treatment/Interventions  Electrical Stimulation;Cryotherapy;ADLs/Self Care Home Management;Iontophoresis 4mg /ml Dexamethasone;Moist Heat;Ultrasound;Therapeutic activities;Therapeutic exercise;Neuromuscular re-education;Patient/family education;Manual techniques;Passive range of motion;Dry needling    PT Next Visit Plan  continue progression ROM and manual therapy for LUE; assess response to posterior shoulder work and ionto to triceps insertional area.    Consulted and Agree with Plan of Care  Patient       Patient will benefit from skilled therapeutic intervention in order to improve the following deficits and impairments:  Increased fascial restricitons, Improper body mechanics, Pain, Hypermobility, Impaired UE functional use, Hypomobility, Decreased strength, Decreased range of motion, Decreased activity tolerance, Postural dysfunction  Visit Diagnosis: 1. Chronic left shoulder pain   2. Abnormal posture   3. Muscle weakness (generalized)   4. Other symptoms and signs involving the musculoskeletal system        Problem List Patient Active Problem List   Diagnosis Date Noted  . Osteoporosis 08/09/2018  . DJD of right AC (acromioclavicular) joint 04/01/2017  .  Rotator cuff tendonitis, right 04/01/2017  . Fatty liver 03/22/2016  . CKD (chronic kidney  disease) stage 3, GFR 30-59 ml/min (HCC) 03/16/2016  . Hypothyroidism 11/01/2008  . Obesity 08/27/2008  . SLEEP DISORDER 01/17/2008  . Diabetes mellitus without complication ([place]) 94/44/6190  . HYPERLIPIDEMIA 09/14/2006  . Essential hypertension 09/14/2006    Katiana Ruland Nilda Simmer PT, MPH  05/21/2019, 7:56 AM  [facility] [place], [place], [address] Phone: [phone]   Fax:  [phone]  Name: Emma Craig MRN: [id number] Date of Birth: February 26, 1951

## 2019-05-24 ENCOUNTER — Ambulatory Visit (INDEPENDENT_AMBULATORY_CARE_PROVIDER_SITE_OTHER): Payer: Medicare Other | Admitting: Physical Therapy

## 2019-05-24 ENCOUNTER — Other Ambulatory Visit: Payer: Self-pay

## 2019-05-24 DIAGNOSIS — M6281 Muscle weakness (generalized): Secondary | ICD-10-CM

## 2019-05-24 DIAGNOSIS — R29898 Other symptoms and signs involving the musculoskeletal system: Secondary | ICD-10-CM

## 2019-05-24 DIAGNOSIS — G8929 Other chronic pain: Secondary | ICD-10-CM

## 2019-05-24 DIAGNOSIS — M25512 Pain in left shoulder: Secondary | ICD-10-CM | POA: Diagnosis not present

## 2019-05-24 DIAGNOSIS — R293 Abnormal posture: Secondary | ICD-10-CM | POA: Diagnosis not present

## 2019-05-24 NOTE — Therapy (Signed)
[place], [place], [address] Phone: [phone]   Fax:  [phone]  Physical Therapy Treatment  Patient Details  Name: Emma Craig MRN: [id number] Date of Birth: 07-Dec-1950 Referring Provider (PT): Dr Lynne Leader    Encounter Date: 05/24/2019  PT End of Session - 05/24/19 0804    Visit Number  8    Number of Visits  12    Date for PT Re-Evaluation  06/12/19    PT Start Time  0805    PT Stop Time  0849    PT Time Calculation (min)  44 min       Past Medical History:  Diagnosis Date  . Chronic kidney disease, stage 2, mildly decreased GFR   . Diabetes mellitus   . Heavy menses   . Hyperlipidemia   . Hypertension   . Obesity     Past Surgical History:  Procedure Laterality Date  . CATARACT EXTRACTION  1-09   right   . CATARACT EXTRACTION  09/2011   left   . CHOLECYSTECTOMY     laproscopic  . rt elbow screws placed  10-82    There were no vitals filed for this visit.  Subjective Assessment - 05/24/19 0805    Subjective  "My shoulder is sore (in back).  I think I'm getting more movement in it."  Pt reports the ionto patch helped with elbow pain.    Patient Stated Goals  get rid of pain; get arm to move    Currently in Pain?  Yes    Pain Score  4     Pain Location  Shoulder    Pain Orientation  Left    Pain Descriptors / Indicators  Aching;Tightness    Aggravating Factors   moving shoulder past comfortable range    Pain Relieving Factors  heat         [facility] PT Assessment - 05/24/19 0001      Assessment   Medical Diagnosis  Lt shoulder dysfunction     Referring Provider (PT)  Dr Lynne Leader     Onset Date/Surgical Date  05/27/18    Hand Dominance  Right    Next MD Visit  05/25/2019    Prior Therapy  here for Rt shoulder pain 2019        [place] Adult PT Treatment/Exercise - 05/24/19 0001      Shoulder Exercises: Pulleys   Flexion  --   10 sec hold x 10 reps    Scaption   --   10 sec hold x 10 reps      Shoulder Exercises: Stretch   Internal Rotation Stretch  5 reps   15 seconds   Other Shoulder Stretches  lat stretch with arms on back of truck x 10 sec x 5 reps     Other Shoulder Stretches  sleeper stretch (ant / post) x 20 sec x 3 reps each direction for RUE.       Modalities   Modalities  Ultrasound;Iontophoresis      Ultrasound   Ultrasound Location  Lt posterior shoulder    Ultrasound Parameters  1.3 w/cm2, 100%, 8 min    Ultrasound Goals  Pain   tightness     Iontophoresis   Type of Iontophoresis  Dexamethasone    Location  Lt elbow - triceps tendon area    Dose  1.0 cc    Time  6 hr      Manual Therapy  Soft tissue mobilization  IASTM to Rt posterior shoulder to decrease fascial restrictions;  STM to Rt subscap, lat, infraspinatus.     Passive ROM  stretch Lt UE overhead in sidelying and supine to stretch triceps and lats/teres/lateral shoulder girdle area                   PT Long Term Goals - 05/01/19 1010      PT LONG TERM GOAL #1   Title  Improve posture and alignment with patient to demonstrate improved upright posture with improve position of scapulae along the thoracic wall 06/12/2019    Time  6    Period  Weeks    Status  New      PT LONG TERM GOAL #2   Title  Increase AROM Lt shoulder to equal or greater than AROM Rt shoulder 06/12/2019    Time  6    Period  Weeks    Status  New      PT LONG TERM GOAL #3   Title  Patient reports ability to use Lt UE for funcitonal activities and lie on Lt side for short periods of time with minimal pain 06/12/2019    Time  6    Period  Weeks    Status  New      PT LONG TERM GOAL #4   Title  Independent in HEP 06/12/2019    Time  6    Period  Weeks    Status  New      PT LONG TERM GOAL #5   Title  Improve FOTO to </= 36% limitation 06/12/2019    Time  6    Period  Weeks    Status  New            Plan - 05/24/19 6789    Clinical Impression Statement  Pt  reported reduction in tightness and pain with stretches after [place] and IASTM to Lt posterior shoulder.  Persistent tightness in Lt teres and subscap. Pt reporting improved ability to lay on Lt side at night for sleep.  Pt making gradual progress towards remaining goals.    PT Frequency  2x / week    PT Duration  6 weeks    PT Treatment/Interventions  Electrical Stimulation;Cryotherapy;ADLs/Self Care Home Management;Iontophoresis 4mg /ml Dexamethasone;Moist Heat;Ultrasound;Therapeutic activities;Therapeutic exercise;Neuromuscular re-education;Patient/family education;Manual techniques;Passive range of motion;Dry needling    PT Next Visit Plan  continue progression ROM and manual therapy for LUE; assess response to posterior shoulder work and ionto to triceps insertional area.    PT Home Exercise Plan  Access Code: [email or web address]     Consulted and Agree with Plan of Care  Patient       Patient will benefit from skilled therapeutic intervention in order to improve the following deficits and impairments:  Increased fascial restricitons, Improper body mechanics, Pain, Hypermobility, Impaired UE functional use, Hypomobility, Decreased strength, Decreased range of motion, Decreased activity tolerance, Postural dysfunction  Visit Diagnosis: 1. Chronic left shoulder pain   2. Abnormal posture   3. Muscle weakness (generalized)   4. Other symptoms and signs involving the musculoskeletal system        Problem List Patient Active Problem List   Diagnosis Date Noted  . Osteoporosis 08/09/2018  . DJD of right AC (acromioclavicular) joint 04/01/2017  . Rotator cuff tendonitis, right 04/01/2017  . Fatty liver 03/22/2016  . CKD (chronic kidney disease) stage 3, GFR 30-59 ml/min (HCC) 03/16/2016  . Hypothyroidism 11/01/2008  . Obesity 08/27/2008  .  SLEEP DISORDER 01/17/2008  . Diabetes mellitus without complication ([place]) 46/80/3212  . HYPERLIPIDEMIA 09/14/2006  . Essential hypertension 09/14/2006    Kerin Perna, PTA 05/24/19 10:57 AM  [facility] [place], [place], [address] Phone: [phone]   Fax:  [phone]  Name: Emma Craig MRN: [id number] Date of Birth: [birth year]

## 2019-05-24 NOTE — Patient Instructions (Signed)
Posterior Capsule Sleeper Stretch, Side-Lying    Lie on side, pillow under head, neck in neutral, underside arm in 90-90 of shoulder and elbow flexion with scapula fixed to table. Use other hand to press back of underside arm forward and downward. Keep elbow angle. Hold _15+__ seconds.  Repeat _3+__ times per session. Do __1_ sessions per day.  Anterior Capsule Sleeper Stretch, Side-Lying    Lie on side, pillow under head, neck in neutral, underside arm in 90-90 of shoulder and elbow flexion with scapula fixed to table. Use other hand to press palm of underside arm forward and downward. Keep elbow angle. Hold _15+__ seconds.  Repeat _3+__ times per session. Do _1__ sessions per day.

## 2019-05-25 ENCOUNTER — Ambulatory Visit: Payer: Medicare Other | Admitting: Family Medicine

## 2019-05-29 ENCOUNTER — Other Ambulatory Visit: Payer: Self-pay

## 2019-05-29 ENCOUNTER — Encounter: Payer: Self-pay | Admitting: Rehabilitative and Restorative Service Providers"

## 2019-05-29 ENCOUNTER — Ambulatory Visit (INDEPENDENT_AMBULATORY_CARE_PROVIDER_SITE_OTHER): Payer: Medicare Other | Admitting: Rehabilitative and Restorative Service Providers"

## 2019-05-29 DIAGNOSIS — R293 Abnormal posture: Secondary | ICD-10-CM

## 2019-05-29 DIAGNOSIS — M25512 Pain in left shoulder: Secondary | ICD-10-CM

## 2019-05-29 DIAGNOSIS — M25511 Pain in right shoulder: Secondary | ICD-10-CM | POA: Diagnosis not present

## 2019-05-29 DIAGNOSIS — G8929 Other chronic pain: Secondary | ICD-10-CM | POA: Diagnosis not present

## 2019-05-29 DIAGNOSIS — R29898 Other symptoms and signs involving the musculoskeletal system: Secondary | ICD-10-CM

## 2019-05-29 DIAGNOSIS — M6281 Muscle weakness (generalized): Secondary | ICD-10-CM | POA: Diagnosis not present

## 2019-05-29 NOTE — Patient Instructions (Signed)
Access Code: [email or web address]  URL: [email or web address]  Date: 05/29/2019  Prepared by: Gillermo Murdoch   Exercises  Seated Shoulder Flexion AAROM with Pulley Behind - 10 reps - 1 sets - 10 sec hold - 2x daily - 7x weekly  Seated Shoulder Scaption AAROM with Pulley at Side - 10 reps - 1 sets - 10 sec hold - 2x daily - 7x weekly  Seated Cervical Retraction - 10 reps - 1 sets - 3x daily - 7x weekly  Standing Scapular Retraction - 10 reps - 1 sets - 10 hold - 3x daily - 7x weekly  Shoulder External Rotation and Scapular Retraction - 10 reps - 1 sets - hold - 3x daily - 7x weekly  Standing Scapular Retraction in Abduction - 10 reps - 1 sets - 3x daily - 7x weekly  Standing 'L' Stretch at Counter - 3-5 reps - 1 sets - 10 sec hold - 2x daily - 7x weekly  Standing Shoulder External Rotation AAROM with Dowel - 5 reps - 1 sets - 10sec hold - 2-3x daily - 7x weekly  Standing Shoulder Internal Rotation AAROM with Dowel - 5 reps - 1 sets - 10sec hold - 2-3x daily - 7x weekly  Standing Bilateral Shoulder Internal Rotation AAROM with Dowel - 5 reps - 1 sets - 10sec hold - 2-3x daily - 7x weekly  Standing Shoulder Extension with Dowel - 5 reps - 1 sets - 10 sec hold - 2-3x daily - 7x weekly  Standing Shoulder Internal Rotation AAROM with Dowel - 3 reps - 1 sets - 10sec hold - 2x daily - 7x weekly  Standing Shoulder Internal Rotation Stretch with Dowel - 3 reps - 1 sets - 10sec hold - 2x daily - 7x weekly  Standing Shoulder External Rotation Stretch in Doorway - 3 reps - 1 sets - 30 sec hold - 2x daily - 7x weekly  Standing Bilateral Low Shoulder Row with Anchored Resistance - 10 reps - 1-2 sets - 2-3 sec hold - 2x daily - 7x weekly  Supine Chest Stretch with Elbows Bent - 3 reps - 1 sets - 30 sec hold - 2x daily - 7x weekly   Today  Lat stretch supine and prayer stretch  Prone Chest Stretch on Chair - 3 reps - 1 sets - 30 sec hold - 2x daily - 7x weekly

## 2019-05-29 NOTE — Therapy (Signed)
[place], [place], [address] Phone: [phone]   Fax:  [phone]  Physical Therapy Treatment  Patient Details  Name: Emma Craig MRN: [id number] Date of Birth: 28-Mar-1951 Referring Provider (PT): Dr Lynne Leader    Encounter Date: 05/29/2019  PT End of Session - 05/29/19 0908    Visit Number  9    Number of Visits  12    Date for PT Re-Evaluation  06/12/19    PT Start Time  0903    PT Stop Time  0957    PT Time Calculation (min)  54 min    Activity Tolerance  Patient tolerated treatment well       Past Medical History:  Diagnosis Date  . Chronic kidney disease, stage 2, mildly decreased GFR   . Diabetes mellitus   . Heavy menses   . Hyperlipidemia   . Hypertension   . Obesity     Past Surgical History:  Procedure Laterality Date  . CATARACT EXTRACTION  1-09   right   . CATARACT EXTRACTION  09/2011   left   . CHOLECYSTECTOMY     laproscopic  . rt elbow screws placed  10-82    There were no vitals filed for this visit.  Subjective Assessment - 05/29/19 0909    Subjective  Patient reports shoulder is sore (in back). getting more movement in shoulder Pt reports the ionto patch still seems to help with elbow pain.    Currently in Pain?  Yes    Pain Score  4     Pain Location  Shoulder    Pain Orientation  Left    Pain Descriptors / Indicators  Aching;Tightness    Pain Type  Chronic pain    Pain Radiating Towards  into elbow    Pain Onset  More than a month ago    Pain Frequency  Constant         [facility] PT Assessment - 05/29/19 0001      Assessment   Medical Diagnosis  Lt shoulder dysfunction     Referring Provider (PT)  Dr Lynne Leader     Onset Date/Surgical Date  05/27/18    Hand Dominance  Right    Next MD Visit  06/13/2019    Prior Therapy  here for Rt shoulder pain 2019       AROM   Right Shoulder Extension  42 Degrees    Right Shoulder Flexion  146 Degrees     Right Shoulder ABduction  154 Degrees    Right Shoulder Internal Rotation  24 Degrees    Right Shoulder External Rotation  89 Degrees    Left Shoulder Extension  42 Degrees    Left Shoulder Flexion  142 Degrees    Left Shoulder ABduction  142 Degrees    Left Shoulder Internal Rotation  16 Degrees   pain    Left Shoulder External Rotation  70 Degrees   pain      Strength   Overall Strength Comments  WFL's bilat UE's pain with resistive testing Lt shoulder flex/abd/IR/ER       Palpation   Palpation comment  muscular tightness through the Lt anterior shoulder at insertion of lats; anterior shoulder/deltoid area; pecs; upper trap; leveator; teres; bicpes                   [facility] Adult PT Treatment/Exercise - 05/29/19 0001      Shoulder Exercises: Supine  Other Supine Exercises  nerve stretch supine 60 sec x 1 rep       Shoulder Exercises: Stretch   Other Shoulder Stretches  lat stretch supine knees to chest, arms overhead 30 sec hold x 3 reps; prayer stretch in kneeling 30 sec x 3       Moist Heat Therapy   Number Minutes Moist Heat  15 Minutes    Moist Heat Location  Shoulder      Electrical Stimulation   Electrical Stimulation Location  posterior Lt shoulder     Electrical Stimulation Action  IFC    Electrical Stimulation Parameters  to tolerance     Electrical Stimulation Goals  Pain;Tone      Manual Therapy   Manual therapy comments  pt prone     Joint Mobilization  Lt GH joint  PA grade 1-2    Soft tissue mobilization  deep tissue work to pt tolerance through the lats into posterior shoulder and periscapular musculature Lt upper quarter     Myofascial Release  posterior shoulder girdle/scap              PT Education - 05/29/19 0930    Education Details  HEP    Person(s) Educated  Patient    Methods  Explanation;Demonstration;Tactile cues;Verbal cues;Handout    Comprehension  Verbalized understanding;Returned demonstration;Verbal cues required;Tactile  cues required          PT Long Term Goals - 05/01/19 1010      PT LONG TERM GOAL #1   Title  Improve posture and alignment with patient to demonstrate improved upright posture with improve position of scapulae along the thoracic wall 06/12/2019    Time  6    Period  Weeks    Status  New      PT LONG TERM GOAL #2   Title  Increase AROM Lt shoulder to equal or greater than AROM Rt shoulder 06/12/2019    Time  6    Period  Weeks    Status  New      PT LONG TERM GOAL #3   Title  Patient reports ability to use Lt UE for funcitonal activities and lie on Lt side for short periods of time with minimal pain 06/12/2019    Time  6    Period  Weeks    Status  New      PT LONG TERM GOAL #4   Title  Independent in HEP 06/12/2019    Time  6    Period  Weeks    Status  New      PT LONG TERM GOAL #5   Title  Improve FOTO to </= 36% limitation 06/12/2019    Time  6    Period  Weeks    Status  New            Plan - 05/29/19 0908    Clinical Impression Statement  Pain persists. ROM is increased in ER but remains painful. Patient has muscular tightness through the posterior shoulder girdle - lats/teres/periscapular musculature Lt. Patient added lat stretch. Will consider DN at next visit if symptoms persist.    PT Frequency  2x / week    PT Duration  6 weeks    PT Treatment/Interventions  Electrical Stimulation;Cryotherapy;ADLs/Self Care Home Management;Iontophoresis 4mg /ml Dexamethasone;Moist Heat;Ultrasound;Therapeutic activities;Therapeutic exercise;Neuromuscular re-education;Patient/family education;Manual techniques;Passive range of motion;Dry needling    PT Next Visit Plan  continue progression ROM and manual therapy for LUE; assess response to posterior shoulder work  and ionto to triceps insertional area. assess response to new stretches and consider DN if symptoms persist.    PT Home Exercise Plan  Access Code: [email or web address]     Consulted and Agree with Plan of Care  Patient        Patient will benefit from skilled therapeutic intervention in order to improve the following deficits and impairments:  Increased fascial restricitons, Improper body mechanics, Pain, Hypermobility, Impaired UE functional use, Hypomobility, Decreased strength, Decreased range of motion, Decreased activity tolerance, Postural dysfunction  Visit Diagnosis: 1. Chronic left shoulder pain   2. Abnormal posture   3. Muscle weakness (generalized)   4. Other symptoms and signs involving the musculoskeletal system   5. Chronic right shoulder pain        Problem List Patient Active Problem List   Diagnosis Date Noted  . Osteoporosis 08/09/2018  . DJD of right AC (acromioclavicular) joint 04/01/2017  . Rotator cuff tendonitis, right 04/01/2017  . Fatty liver 03/22/2016  . CKD (chronic kidney disease) stage 3, GFR 30-59 ml/min (HCC) 03/16/2016  . Hypothyroidism 11/01/2008  . Obesity 08/27/2008  . SLEEP DISORDER 01/17/2008  . Diabetes mellitus without complication ([place]) 76/39/4320  . HYPERLIPIDEMIA 09/14/2006  . Essential hypertension 09/14/2006    Patrizia Paule Nilda Simmer PT, MPH  05/29/2019, 10:02 AM  [facility] [place], [place], [address] Phone: [phone]   Fax:  [phone]  Name: Emma Craig MRN: [id number] Date of Birth: [birth year]

## 2019-05-31 ENCOUNTER — Ambulatory Visit (INDEPENDENT_AMBULATORY_CARE_PROVIDER_SITE_OTHER): Payer: Medicare Other | Admitting: Rehabilitative and Restorative Service Providers"

## 2019-05-31 ENCOUNTER — Encounter: Payer: Self-pay | Admitting: Rehabilitative and Restorative Service Providers"

## 2019-05-31 ENCOUNTER — Other Ambulatory Visit: Payer: Self-pay

## 2019-05-31 DIAGNOSIS — M6281 Muscle weakness (generalized): Secondary | ICD-10-CM

## 2019-05-31 DIAGNOSIS — G8929 Other chronic pain: Secondary | ICD-10-CM | POA: Diagnosis not present

## 2019-05-31 DIAGNOSIS — R29898 Other symptoms and signs involving the musculoskeletal system: Secondary | ICD-10-CM

## 2019-05-31 DIAGNOSIS — M25512 Pain in left shoulder: Secondary | ICD-10-CM | POA: Diagnosis not present

## 2019-05-31 DIAGNOSIS — R293 Abnormal posture: Secondary | ICD-10-CM

## 2019-05-31 NOTE — Therapy (Signed)
[place], [place], [address] Phone: [phone]   Fax:  [phone]  Physical Therapy Treatment  Patient Details  Name: Emma Craig MRN: [id number] Date of Birth: 1951-05-22 Referring Provider (PT): Dr Lynne Leader   Progress Note Reporting Period 05/01/2019  to 05/31/2019  See note below for Objective Data and Assessment of Progress/Goals.       Encounter Date: 05/31/2019  PT End of Session - 05/31/19 0809    Visit Number  10    Number of Visits  12    Date for PT Re-Evaluation  06/12/19    PT Start Time  0802    PT Stop Time  0900    PT Time Calculation (min)  58 min    Activity Tolerance  Patient tolerated treatment well       Past Medical History:  Diagnosis Date  . Chronic kidney disease, stage 2, mildly decreased GFR   . Diabetes mellitus   . Heavy menses   . Hyperlipidemia   . Hypertension   . Obesity     Past Surgical History:  Procedure Laterality Date  . CATARACT EXTRACTION  1-09   right   . CATARACT EXTRACTION  09/2011   left   . CHOLECYSTECTOMY     laproscopic  . rt elbow screws placed  10-82    There were no vitals filed for this visit.  Subjective Assessment - 05/31/19 0809    Subjective  Thinks the new stretches are helping some. Almost fell to the Lt side and caught herself and felt increased pain in the Lt shoulder after that. Overall better than it was initially.    Currently in Pain?  Yes    Pain Score  3     Pain Location  Shoulder    Pain Orientation  Left         [facility] PT Assessment - 05/31/19 0001      Assessment   Medical Diagnosis  Lt shoulder dysfunction     Referring Provider (PT)  Dr Lynne Leader     Onset Date/Surgical Date  05/27/18    Hand Dominance  Right    Next MD Visit  06/13/2019    Prior Therapy  here for Rt shoulder pain 2019       AROM   Right Shoulder Extension  42 Degrees    Right Shoulder Flexion  146 Degrees    Right Shoulder  ABduction  154 Degrees    Right Shoulder Internal Rotation  24 Degrees    Right Shoulder External Rotation  89 Degrees    Left Shoulder Extension  42 Degrees    Left Shoulder Flexion  142 Degrees    Left Shoulder ABduction  142 Degrees    Left Shoulder Internal Rotation  16 Degrees   pain    Left Shoulder External Rotation  70 Degrees   pain      Strength   Overall Strength Comments  WFL's bilat UE's pain with resistive testing Lt shoulder flex/abd/IR/ER       Palpation   Palpation comment  muscular tightness through the Lt lats; teres; posterior deltoid; triceps; anterior shoulder at insertion of lats                    [facility] Adult PT Treatment/Exercise - 05/31/19 0001      Shoulder Exercises: Supine   Other Supine Exercises  nerve stretch supine 60 sec x 1 rep  Shoulder Exercises: Pulleys   Flexion  --   10 sec hold x 10 reps    Scaption  --   10 sec hold x 10 reps      Shoulder Exercises: Stretch   Other Shoulder Stretches  lat stretch supine knees to chest, arms overhead 30 sec hold x 3 reps; prayer stretch in kneeling 30 sec x 3       Moist Heat Therapy   Number Minutes Moist Heat  20 Minutes    Moist Heat Location  Shoulder      Electrical Stimulation   Electrical Stimulation Location  posterior Lt shoulder     Electrical Stimulation Action  IFC    Electrical Stimulation Parameters  to tolerance    Electrical Stimulation Goals  Pain;Tone      Manual Therapy   Manual therapy comments  pt prone     Joint Mobilization  Lt GH joint  PA grade 1-2    Soft tissue mobilization  deep tissue work to pt tolerance through the lats into posterior shoulder and periscapular musculature Lt upper quarter     Myofascial Release  posterior shoulder girdle/scap     Scapular Mobilization  Lt        Trigger Point Dry Needling - 05/31/19 0001    Consent Given?  Yes    Education Handout Provided  Yes    Subscapularis Response  Palpable increased muscle length     Deltoid Response  Palpable increased muscle length   posterior    Latissimus dorsi Response  Palpable increased muscle length    Teres major Response  Palpable increased muscle length    Teres minor Response  Palpable increased muscle length           PT Education - 05/31/19 0851    Education Details  DN    Person(s) Educated  Patient    Methods  Explanation    Comprehension  Verbalized understanding          PT Long Term Goals - 05/01/19 1010      PT LONG TERM GOAL #1   Title  Improve posture and alignment with patient to demonstrate improved upright posture with improve position of scapulae along the thoracic wall 06/12/2019    Time  6    Period  Weeks    Status  New      PT LONG TERM GOAL #2   Title  Increase AROM Lt shoulder to equal or greater than AROM Rt shoulder 06/12/2019    Time  6    Period  Weeks    Status  New      PT LONG TERM GOAL #3   Title  Patient reports ability to use Lt UE for funcitonal activities and lie on Lt side for short periods of time with minimal pain 06/12/2019    Time  6    Period  Weeks    Status  New      PT LONG TERM GOAL #4   Title  Independent in HEP 06/12/2019    Time  6    Period  Weeks    Status  New      PT LONG TERM GOAL #5   Title  Improve FOTO to </= 36% limitation 06/12/2019    Time  6    Period  Weeks    Status  New            Plan - 05/31/19 0809    Clinical Impression Statement  Cayleen demonstrates increased Lt shoulder ROM and some improvement in shoulder pain. however, pain remains constant and patient experiences increased pain with pushing up and with bearing weight on the lt UE. She has pain in the posterior arm and into the posterior shoulder area. There is muscular tightness lt shoulder in the lats; teres; posterior deltoid; triceps areas. Patient feels familiar pain wiht supine lat stretch. She has improved but symptoms persist. Scheduled for return to MD 06/06/2019.    PT Frequency  2x / week    PT  Duration  6 weeks    PT Treatment/Interventions  Electrical Stimulation;Cryotherapy;ADLs/Self Care Home Management;Iontophoresis 4mg /ml Dexamethasone;Moist Heat;Ultrasound;Therapeutic activities;Therapeutic exercise;Neuromuscular re-education;Patient/family education;Manual techniques;Passive range of motion;Dry needling    PT Next Visit Plan  continue progression ROM and manual therapy for LUE; assess response to posterior shoulder work and ionto to triceps insertional area. assess response to new stretches and consider DN if symptoms persist.    PT Home Exercise Plan  Access Code: [email or web address]     Consulted and Agree with Plan of Care  Patient       Patient will benefit from skilled therapeutic intervention in order to improve the following deficits and impairments:  Increased fascial restricitons, Improper body mechanics, Pain, Hypermobility, Impaired UE functional use, Hypomobility, Decreased strength, Decreased range of motion, Decreased activity tolerance, Postural dysfunction  Visit Diagnosis: 1. Chronic left shoulder pain   2. Abnormal posture   3. Muscle weakness (generalized)   4. Other symptoms and signs involving the musculoskeletal system        Problem List Patient Active Problem List   Diagnosis Date Noted  . Osteoporosis 08/09/2018  . DJD of right AC (acromioclavicular) joint 04/01/2017  . Rotator cuff tendonitis, right 04/01/2017  . Fatty liver 03/22/2016  . CKD (chronic kidney disease) stage 3, GFR 30-59 ml/min (HCC) 03/16/2016  . Hypothyroidism 11/01/2008  . Obesity 08/27/2008  . SLEEP DISORDER 01/17/2008  . Diabetes mellitus without complication ([place]) 60/08/9322  . HYPERLIPIDEMIA 09/14/2006  . Essential hypertension 09/14/2006    Taylie Helder Nilda Simmer PT, MPH  05/31/2019, 9:21 AM  [facility] [place], [place], [address] Phone: [phone]   Fax:  [phone]  Name: Sandralee Tarkington MRN: [id number] Date  of Birth: 06/15/1951

## 2019-05-31 NOTE — Patient Instructions (Signed)

## 2019-06-04 ENCOUNTER — Encounter: Payer: Self-pay | Admitting: Family Medicine

## 2019-06-04 ENCOUNTER — Other Ambulatory Visit: Payer: Self-pay

## 2019-06-04 ENCOUNTER — Ambulatory Visit (INDEPENDENT_AMBULATORY_CARE_PROVIDER_SITE_OTHER): Payer: Medicare Other | Admitting: Family Medicine

## 2019-06-04 VITALS — BP 136/82 | HR 79 | Ht 61.0 in | Wt 200.0 lb

## 2019-06-04 DIAGNOSIS — N183 Chronic kidney disease, stage 3 unspecified: Secondary | ICD-10-CM

## 2019-06-04 DIAGNOSIS — I10 Essential (primary) hypertension: Secondary | ICD-10-CM

## 2019-06-04 DIAGNOSIS — Z6837 Body mass index (BMI) 37.0-37.9, adult: Secondary | ICD-10-CM | POA: Diagnosis not present

## 2019-06-04 DIAGNOSIS — E119 Type 2 diabetes mellitus without complications: Secondary | ICD-10-CM | POA: Diagnosis not present

## 2019-06-04 DIAGNOSIS — R740 Nonspecific elevation of levels of transaminase and lactic acid dehydrogenase [LDH]: Secondary | ICD-10-CM | POA: Diagnosis not present

## 2019-06-04 DIAGNOSIS — Z6835 Body mass index (BMI) 35.0-35.9, adult: Secondary | ICD-10-CM | POA: Diagnosis not present

## 2019-06-04 DIAGNOSIS — R7401 Elevation of levels of liver transaminase levels: Secondary | ICD-10-CM

## 2019-06-04 LAB — POCT GLYCOSYLATED HEMOGLOBIN (HGB A1C): Hemoglobin A1C: 6.3 % — AB (ref 4.0–5.6)

## 2019-06-04 NOTE — Assessment & Plan Note (Signed)
Recheck liver enzymes

## 2019-06-04 NOTE — Assessment & Plan Note (Signed)
Discussed options including Victoza or Ozempic. She will think about it. Nervious about doing an injection. Encouraged her to exercise more regularly as well

## 2019-06-04 NOTE — Progress Notes (Signed)
Established Patient Office Visit  Subjective:  Patient ID: Emma Craig, female    DOB: 1951/10/12  Age: 68 y.o. MRN: [id number]  CC:  Chief Complaint  Patient presents with  . Diabetes  . Hypertension    HPI Emma Craig presents for   Hypertension- Pt denies chest pain, SOB, dizziness, or heart palpitations.  Taking meds as directed w/o problems.  Denies medication side effects.   Diabetes - no hypoglycemic events. No wounds or sores that are not healing well. No increased thirst or urination. Checking glucose at home. Taking medications as prescribed without any side effects.  F/U CKD 3 - no recent changes to urination.   Past Medical History:  Diagnosis Date  . Chronic kidney disease, stage 2, mildly decreased GFR   . Diabetes mellitus   . Heavy menses   . Hyperlipidemia   . Hypertension   . Obesity     Past Surgical History:  Procedure Laterality Date  . CATARACT EXTRACTION  1-09   right   . CATARACT EXTRACTION  09/2011   left   . CHOLECYSTECTOMY     laproscopic  . rt elbow screws placed  10-82    Family History  Problem Relation Age of Onset  . Cancer Father 70       colon and prostate CA  . Cancer Mother 64       carcinoid tumor/with mets to brain  . Hypertension Sister   . Hyperlipidemia Sister   . Hypertension Brother   . Hypertension Brother   . Hypertension Sister   . Hyperlipidemia Sister   . Hypertension Sister   . Hyperlipidemia Sister   . Hypertension Sister   . Hyperlipidemia Sister   . Lymphoma Sister        non-hodgkins     Social History   Socioeconomic History  . Marital status: Married    Spouse name: Not on file  . Number of children: Not on file  . Years of education: Not on file  . Highest education level: Not on file  Occupational History  . Not on file  Social Needs  . Financial resource strain: Not on file  . Food insecurity    Worry: Not on file    Inability: Not on file  . Transportation needs    Medical: Not on  file    Non-medical: Not on file  Tobacco Use  . Smoking status: Never Smoker  . Smokeless tobacco: Never Used  Substance and Sexual Activity  . Alcohol use: Yes    Comment: rare  . Drug use: Not on file  . 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  Lifestyle  . Physical activity    Days per week: Not on file    Minutes per session: Not on file  . Stress: Not on file  Relationships  . Social Herbalist on phone: Not on file    Gets together: Not on file    Attends religious service: Not on file    Active member of club or organization: Not on file    Attends meetings of clubs or organizations: Not on file    Relationship status: Not on file  . Intimate partner violence    Fear of current or ex partner: Not on file    Emotionally abused: Not on file    Physically  abused: Not on file    Forced sexual activity: Not on file  Other Topics Concern  . Not on file  Social History Narrative   She cloggs for fun.      Outpatient Medications Prior to Visit  Medication Sig Dispense Refill  . amLODipine-atorvastatin (CADUET) 10-80 MG tablet Take 1 tablet by mouth daily. 90 tablet 3  . aspirin 81 MG EC tablet Take 81 mg by mouth daily.      . Calcium Carb-Cholecalciferol (CALTRATE 600+D3 PO) Take 1 tablet by mouth daily.    . Cholecalciferol (VITAMIN D-3) 1000 UNITS CAPS Take 1 tablet by mouth daily.    . diclofenac sodium (VOLTAREN) 1 % GEL Apply 4 g topically 4 (four) times daily. To affected joint. 100 g 11  . fish oil-omega-3 fatty acids 1000 MG capsule Take 1 g by mouth daily.      Marland Kitchen levothyroxine (SYNTHROID, LEVOTHROID) 75 MCG tablet TAKE 1 TABLET DAILY BEFORE BREAKFAST 90 tablet 4  . olmesartan (BENICAR) 40 MG tablet TAKE 1 TABLET DAILY 90 tablet 3  . TOPROL XL 100 MG 24 hr tablet TAKE 1 TABLET DAILY. TAKE WITH OR IMMEDIATELY FOLLOWING A MEAL 90  tablet 4   No facility-administered medications prior to visit.     Allergies  Allergen Reactions  . Synthroid [Levothyroxine] Other (See Comments)    Tongue swelling b/c of filler.    ROS Review of Systems    Objective:    Physical Exam  Constitutional: She is oriented to person, place, and time. She appears well-developed and well-nourished.  HENT:  Head: Normocephalic and atraumatic.  Cardiovascular: Normal rate, regular rhythm and normal heart sounds.  Pulmonary/Chest: Effort normal and breath sounds normal.  Neurological: She is alert and oriented to person, place, and time.  Skin: Skin is warm and dry.  Psychiatric: She has a normal mood and affect. Her behavior is normal.    BP 136/82   Pulse 79   Ht 5\' 1"  (1.549 m)   Wt 200 lb (90.7 kg)   LMP  (LMP Unknown)   SpO2 98%   BMI 37.79 kg/m  Wt Readings from Last 3 Encounters:  06/04/19 200 lb (90.7 kg)  04/27/19 199 lb (90.3 kg)  01/29/19 197 lb (89.4 kg)     Health Maintenance Due  Topic Date Due  . PAP SMEAR-Modifier  02/25/2019  . FOOT EXAM  05/25/2019    There are no preventive care reminders to display for this patient.  Lab Results  Component Value Date   TSH 0.76 01/29/2019   Lab Results  Component Value Date   WBC 7.5 01/25/2018   HGB 12.9 01/25/2018   HCT 38.3 01/25/2018   MCV 88.2 01/25/2018   PLT 363 01/25/2018   Lab Results  Component Value Date   NA 142 01/29/2019   K 4.7 01/29/2019   CO2 26 01/29/2019   GLUCOSE 104 (H) 01/29/2019   BUN 16 01/29/2019   CREATININE 0.94 01/29/2019   BILITOT 0.3 01/29/2019   ALKPHOS 90 02/21/2017   AST 24 01/29/2019   ALT 30 (H) 01/29/2019   PROT 7.3 01/29/2019   ALBUMIN 4.1 02/21/2017   CALCIUM 9.6 01/29/2019   Lab Results  Component Value Date   CHOL 196 01/29/2019   Lab Results  Component Value Date   HDL 58 01/29/2019   Lab Results  Component Value Date   LDLCALC 110 (H) 01/29/2019   Lab Results  Component Value Date   TRIG  162 (H)  01/29/2019   Lab Results  Component Value Date   CHOLHDL 3.4 01/29/2019   Lab Results  Component Value Date   HGBA1C 6.3 (A) 06/04/2019      Assessment & Plan:   Problem List Items Addressed This Visit      Cardiovascular and Mediastinum   Essential hypertension    Well controlled. Continue current regimen. Follow up in  4 months      Relevant Orders   POCT glycosylated hemoglobin (Hb A1C) (Completed)     Endocrine   Diabetes mellitus without complication ([place]) - Primary    Well controlled. Continue current regimen. Follow up in  4 months.       Relevant Orders   POCT glycosylated hemoglobin (Hb A1C) (Completed)     Genitourinary   CKD (chronic kidney disease) stage 3, GFR 30-59 ml/min (HCC)    Last kidney function was stble.  Due for urine microalbumin        Other   Severe obesity (BMI 35.0-35.9 with comorbidity) ([place])    Discussed options including Victoza or Ozempic. She will think about it. Nervious about doing an injection. Encouraged her to exercise more regularly as well      Elevated ALT measurement    Recheck liver enzymes       Other Visit Diagnoses    BMI 37.0-37.9, adult          No orders of the defined types were placed in this encounter.   Follow-up: Return in about 4 months (around 10/05/2019) for Diabetes follow-up.    Beatrice Lecher, MD

## 2019-06-04 NOTE — Assessment & Plan Note (Signed)
Well controlled. Continue current regimen. Follow up in  4 months.   

## 2019-06-04 NOTE — Assessment & Plan Note (Signed)
Last kidney function was stble.  Due for urine microalbumin

## 2019-06-05 ENCOUNTER — Encounter: Payer: Self-pay | Admitting: Physical Therapy

## 2019-06-05 ENCOUNTER — Ambulatory Visit (INDEPENDENT_AMBULATORY_CARE_PROVIDER_SITE_OTHER): Payer: Medicare Other | Admitting: Physical Therapy

## 2019-06-05 DIAGNOSIS — G8929 Other chronic pain: Secondary | ICD-10-CM

## 2019-06-05 DIAGNOSIS — R293 Abnormal posture: Secondary | ICD-10-CM

## 2019-06-05 DIAGNOSIS — R29898 Other symptoms and signs involving the musculoskeletal system: Secondary | ICD-10-CM

## 2019-06-05 DIAGNOSIS — M6281 Muscle weakness (generalized): Secondary | ICD-10-CM

## 2019-06-05 DIAGNOSIS — M25512 Pain in left shoulder: Secondary | ICD-10-CM | POA: Diagnosis not present

## 2019-06-05 NOTE — Therapy (Addendum)
[facility], [place], [address] Phone: [phone]   Fax:  [phone]  Physical Therapy Treatment  Patient Details  Name: Haliyah Fryman MRN: [id number] Date of Birth: [birth year] Referring Provider (PT): Dr Lynne Leader    Encounter Date: 06/05/2019  PT End of Session - 06/05/19 0807    Visit Number  11    Number of Visits  12    Date for PT Re-Evaluation  06/12/19    PT Start Time  0804    PT Stop Time  0850    PT Time Calculation (min)  46 min    Activity Tolerance  Patient tolerated treatment well    Behavior During Therapy  [facility] for tasks assessed/performed       Past Medical History:  Diagnosis Date  . Chronic kidney disease, stage 2, mildly decreased GFR   . Diabetes mellitus   . Heavy menses   . Hyperlipidemia   . Hypertension   . Obesity     Past Surgical History:  Procedure Laterality Date  . CATARACT EXTRACTION  1-09   right   . CATARACT EXTRACTION  09/2011   left   . CHOLECYSTECTOMY     laproscopic  . rt elbow screws placed  10-82    There were no vitals filed for this visit.  Subjective Assessment - 06/05/19 0807    Subjective  Pt reports she has some bruising in back of Lt shoulder from DN last session.  She states her "my shoulder is warm to touch" and "It's sore in other places now".  She notes she has more motion than when she initial began therapy, however still can not lay on shoulder or reach behind back.    Pertinent History  Rt shoulder dysfunction treated conservatively ~ 2 years ago. HTN; AODM     Patient Stated Goals  get rid of pain; get arm to move    Currently in Pain?  Yes    Pain Score  4     Pain Location  Shoulder    Pain Orientation  Left    Pain Descriptors / Indicators  Constant;Dull    Aggravating Factors   moving shoulder past comfortable range    Pain Relieving Factors  heat         [facility] PT Assessment - 06/05/19 0001      Assessment   Medical  Diagnosis  Lt shoulder dysfunction     Referring Provider (PT)  Dr Lynne Leader     Onset Date/Surgical Date  05/27/18    Hand Dominance  Right    Next MD Visit  06/13/2019    Prior Therapy  here for Rt shoulder pain 2019       AROM   Right Shoulder Extension  42 Degrees    Right Shoulder Flexion  146 Degrees    Right Shoulder ABduction  154 Degrees    Right Shoulder Internal Rotation  24 Degrees    Right Shoulder External Rotation  89 Degrees    Left Shoulder Extension  42 Degrees    Left Shoulder Flexion  142 Degrees    Left Shoulder Internal Rotation  16 Degrees   pain    Left Shoulder External Rotation  70 Degrees   pain       [facility] Adult PT Treatment/Exercise - 06/05/19 0001      Shoulder Exercises: Standing   Other Standing Exercises  axial ext with W's x 5 sec hold x 10  reps      Shoulder Exercises: Pulleys   Flexion  --   10 sec hold x 10 reps    Scaption  --   10 sec hold x 10 reps      Shoulder Exercises: Stretch   Internal Rotation Stretch  10 seconds   5 reps   Other Shoulder Stretches  sleeper stretch (ant / post) x 20 sec x 3 reps each direction for LUE.     Other Shoulder Stretches   prayer stretch in kneeling 30 sec x 3       Modalities   Modalities  Iontophoresis   pt declined other modalities; will use ice at home.      Iontophoresis   Type of Iontophoresis  Dexamethasone    Location  Lt posterior shoulder (infraspinatus insertion    Dose  1.0 cc     Time  120 mA, 12 hr patch      Manual Therapy   Manual therapy comments  pt seated     Soft tissue mobilization  IASTM to Rt posterior Lt shoulder and tricep to decrease fascial restrictions    Kinesiotex  --   sensitive skin Rock tape distal tricep over elbow.      Time spent verbally reviewing HEP verbally, educating pt on ice application parameters and rationale, and IASTM rationale. Pt verbalized understanding.      PT Long Term Goals - 06/05/19 0811      PT LONG TERM GOAL #1   Title   Improve posture and alignment with patient to demonstrate improved upright posture with improve position of scapulae along the thoracic wall 06/12/2019    Time  6    Period  Weeks    Status  Partially Met      PT LONG TERM GOAL #2   Title  Increase AROM Lt shoulder to equal or greater than AROM Rt shoulder 06/12/2019    Time  6    Period  Weeks    Status  Partially Met      PT LONG TERM GOAL #3   Title  Patient reports ability to use Lt UE for functional activities and lie on Lt side for short periods of time with minimal pain 06/12/2019    Time  6    Period  Weeks    Status  On-going      PT LONG TERM GOAL #4   Title  Independent in HEP 06/12/2019    Time  6    Period  Weeks    Status  On-going      PT LONG TERM GOAL #5   Title  Improve FOTO to </= 36% limitation 06/12/2019    Time  6    Period  Weeks    Status  On-going            Plan - 06/05/19 0856    Clinical Impression Statement  Pt's ROM has improved since initial visit, however Lt shoulder pain and posterior shoulder tightness has been persistant.  Pt continues to have difficulty bearing weight through LUE.  Recommended pt apply ice to reduce localized heat and irritation.  Pt has partially met her goals.  Pt returns to MD for follow up tomorrow.    Rehab Potential  Good    PT Frequency  2x / week    PT Duration  6 weeks    PT Treatment/Interventions  Electrical Stimulation;Cryotherapy;ADLs/Self Care Home Management;Iontophoresis 4mg/ml Dexamethasone;Moist Heat;Ultrasound;Therapeutic activities;Therapeutic exercise;Neuromuscular re-education;Patient/family education;Manual techniques;Passive range of   motion;Dry needling    PT Next Visit Plan  await further advisement from MD.    PT Home Exercise Plan  Access Code: [email or web address]     Consulted and Agree with Plan of Care  Patient       Patient will benefit from skilled therapeutic intervention in order to improve the following deficits and impairments:  Increased fascial  restricitons, Improper body mechanics, Pain, Hypermobility, Impaired UE functional use, Hypomobility, Decreased strength, Decreased range of motion, Decreased activity tolerance, Postural dysfunction  Visit Diagnosis: 1. Chronic left shoulder pain   2. Abnormal posture   3. Muscle weakness (generalized)   4. Other symptoms and signs involving the musculoskeletal system        Problem List Patient Active Problem List   Diagnosis Date Noted  . Elevated ALT measurement 06/04/2019  . Osteoporosis 08/09/2018  . DJD of right AC (acromioclavicular) joint 04/01/2017  . Rotator cuff tendonitis, right 04/01/2017  . Fatty liver 03/22/2016  . CKD (chronic kidney disease) stage 3, GFR 30-59 ml/min (HCC) 03/16/2016  . Hypothyroidism 11/01/2008  . Severe obesity (BMI 35.0-35.9 with comorbidity) ([place]) 08/27/2008  . SLEEP DISORDER 01/17/2008  . Diabetes mellitus without complication ([place]) 82/99/3716  . HYPERLIPIDEMIA 09/14/2006  . Essential hypertension 09/14/2006   Kerin Perna, PTA 06/05/19 9:06 AM  [facility] [place], [place], [address] Phone: [phone]   Fax:  [phone]  Name: Sharvi Mooneyhan MRN: [id number] Date of Birth: 01-14-1951  PHYSICAL THERAPY DISCHARGE SUMMARY  Visits from Start of Care: 11  Current functional level related to goals / functional outcomes: See last progress note for discharge status    Remaining deficits: Continued Lt shoulder pain    Education / Equipment: HEP  Plan: Patient agrees to discharge.  Patient goals were not met. Patient is being discharged due to                                                     ?????    Return to MD for follow-up. Injection with good results.  Celyn P. Helene Kelp PT, MPH 07/26/19 8:37 AM

## 2019-06-06 ENCOUNTER — Ambulatory Visit (INDEPENDENT_AMBULATORY_CARE_PROVIDER_SITE_OTHER): Payer: Medicare Other

## 2019-06-06 ENCOUNTER — Ambulatory Visit (INDEPENDENT_AMBULATORY_CARE_PROVIDER_SITE_OTHER): Payer: Medicare Other | Admitting: Family Medicine

## 2019-06-06 ENCOUNTER — Other Ambulatory Visit: Payer: Self-pay

## 2019-06-06 ENCOUNTER — Encounter: Payer: Self-pay | Admitting: Family Medicine

## 2019-06-06 VITALS — BP 139/57 | HR 77 | Temp 98.3°F | Ht 61.5 in | Wt 200.0 lb

## 2019-06-06 DIAGNOSIS — M25522 Pain in left elbow: Secondary | ICD-10-CM

## 2019-06-06 DIAGNOSIS — M25512 Pain in left shoulder: Secondary | ICD-10-CM | POA: Diagnosis not present

## 2019-06-06 NOTE — Patient Instructions (Addendum)
Thank you for coming in today. You should hear about MRI soon.  Recheck with me a few days after MRI.  I will get the report to you as soon as it is available.   I think the elbow pain is tennis elbow.  Work on the elbow straight wrist down stretch and the exercise where the wrist goes from up to down slowly. Remember to keep the elbow straight.  Ok to also use theraband flexbar.  30 resp 2x daily.   Get xray of elbow today.    Tennis Elbow Tennis elbow is swelling (inflammation) in your outer forearm, near your elbow. Swelling affects the tissues that connect muscle to bone (tendons). Tennis elbow can happen in any sport or job in which you use your elbow too much. It is caused by doing the same motion over and over. Tennis elbow can cause:  Pain and tenderness in your forearm and the outer part of your elbow. You may have pain all the time, or only when using the arm.  A burning feeling. This runs from your elbow through your arm.  Weak grip in your hand. Follow these instructions at home: Activity  Rest your elbow and wrist. Avoid activities that cause problems, as told by your doctor.  If told by your doctor, wear an elbow strap to reduce stress on the area.  Do physical therapy exercises as told.  If you lift an object, lift it with your palm facing up. This is easier on your elbow. Lifestyle  If your tennis elbow is caused by sports, check your equipment and make sure that: ? You are using it correctly. ? It fits you well.  If your tennis elbow is caused by work or by using a computer, take breaks often to stretch your arm. Talk with your manager about how you can manage your condition at work. If you have a brace:  Wear the brace as told by your doctor. Remove it only as told by your doctor.  Loosen the brace if your fingers tingle, get numb, or turn cold and blue.  Keep the brace clean.  If the brace is not waterproof, ask your doctor if you may take the brace  off for bathing. If you must keep the brace on while bathing: ? Do not let it get wet. ? Cover it with a watertight covering when you take a bath or a shower. General instructions   If told, put ice on the painful area: ? Put ice in a plastic bag. ? Place a towel between your skin and the bag. ? Leave the ice on for 20 minutes, 2-3 times a day.  Take over-the-counter and prescription medicines only as told by your doctor.  Keep all follow-up visits as told by your doctor. This is important. Contact a doctor if:  Your pain does not get better with treatment.  Your pain gets worse.  You have weakness in your forearm, hand, or fingers.  You cannot feel your forearm, hand, or fingers. Summary  Tennis elbow is swelling (inflammation) in your outer forearm, near your elbow.  Tennis elbow is caused by doing the same motion over and over.  Rest your elbow and wrist. Avoid activities that cause problems, as told by your doctor.  If told, put ice on the painful area for 20 minutes, 2-3 times a day. This information is not intended to replace advice given to you by your health care provider. Make sure you discuss any questions you have  with your health care provider. Document Released: 05/05/2010 Document Revised: 08/11/2018 Document Reviewed: 08/30/2017 Elsevier Patient Education  2020 [employer].

## 2019-06-06 NOTE — Progress Notes (Signed)
Emma Craig is a 68 y.o. female who presents to [facility] today for continued shoulder pain.  Trista was seen in late May for left shoulder pain.  She was thought to have rotator cuff tendinopathy.  X-ray at that time was largely normal.  She has attended multiple sessions of physical therapy.  Her range of motion has improved but she continues to have pain mostly in the right lateral upper arm.  Pain is worse with overhead motion reaching back.  She spoke with her physical therapist and they both think that she is plateaued with therapy and likely will benefit from reevaluation.  Additionally she notes that she is developed some pain into her left elbow.  She notes pain at the left lateral epicondyle a also as well as pain at the olecranon.  She notes pain is olecranon when she rests her elbow on an object.  She started using a cushion to rest her elbow which definitely helps.  She denies any injury.  No fevers or chills.  ROS:  As above  Exam:  BP (!) 139/57   Pulse 77   Temp 98.3 F (36.8 C) (Oral)   Ht 5' 1.5" (1.562 m)   Wt 200 lb (90.7 kg)   LMP  (LMP Unknown)   SpO2 99%   BMI 37.18 kg/m  Wt Readings from Last 5 Encounters:  06/06/19 200 lb (90.7 kg)  06/04/19 200 lb (90.7 kg)  04/27/19 199 lb (90.3 kg)  01/29/19 197 lb (89.4 kg)  09/27/18 198 lb (89.8 kg)   General: Well Developed, well nourished, and in no acute distress.  Neuro/Psych: Alert and oriented x3, extra-ocular muscles intact, able to move all 4 extremities, sensation grossly intact. Skin: Warm and dry, no rashes noted.  Respiratory: Not using accessory muscles, speaking in full sentences, trachea midline.  Cardiovascular: Pulses palpable, no extremity edema. Abdomen: Does not appear distended. MSK:  Left shoulder normal-appearing not particularly tender.  Normal abduction range of motion however pain beyond 100 degrees.  Internal rotation limited to lumbar spine.   Normal external rotation. Intact strength.  Positive Hawkins and Neer's test.  Positive to can test.  Left elbow normal-appearing.  Normal motion. Tender palpation lateral epicondyle.  Minimally tender at olecranon without masses palpated. Some pain present in lateral epicondyle with resisted wrist extension.  Pulses cap refill and sensation are intact distally.    Lab and Radiology Results Results for orders placed or performed in visit on 06/04/19 (from the past 72 hour(s))  POCT glycosylated hemoglobin (Hb A1C)     Status: Abnormal   Collection Time: 06/04/19  7:43 AM  Result Value Ref Range   Hemoglobin A1C 6.3 (A) 4.0 - 5.6 %   HbA1c POC (<> result, manual entry)     HbA1c, POC (prediabetic range)     HbA1c, POC (controlled diabetic range)     No results found.   X-ray images left elbow personally independently reviewed. Largely normal-appearing.  No acute fractures.  Only minimal degenerative changes.  No deformity present. Await formal radiology overread  Assessment and Plan: 68 y.o. female with  Left shoulder pain.  Failing typical conservative management.  Patient has had greater 6 weeks of physician directed management including physical therapy.  Plan for MRI for potential surgical or injection planning.  Recheck after MRI.  Elbow pain: Pain at lateral epicondyle is very likely lateral epicondylitis.  Plan for home exercise program.  Pain at olecranon is likely pressure or  contusion.  No olecranon bursitis at this time.  Watchful waiting. Elbow pain is new  PDMP not reviewed this encounter. Orders Placed This Encounter  Procedures  . MR Shoulder Left Wo Contrast    Standing Status:   Future    Standing Expiration Date:   08/06/2020    Order Specific Question:   What is the patient's sedation requirement?    Answer:   No Sedation    Order Specific Question:   Does the patient have a pacemaker or implanted devices?    Answer:   No    Order Specific Question:    Preferred imaging location?    Answer:   [facility] (table limit-350lbs)    Order Specific Question:   Radiology Contrast Protocol - do NOT remove file path    Answer:   \\[email or web address]\[facility]\[email or web address].PDF  . DG Elbow Complete Left    Standing Status:   Future    Number of Occurrences:   1    Standing Expiration Date:   08/06/2020    Order Specific Question:   Reason for Exam (SYMPTOM  OR DIAGNOSIS REQUIRED)    Answer:   eval elbow pain left    Order Specific Question:   Preferred imaging location?    Answer:   [facility]    Order Specific Question:   Radiology Contrast Protocol - do NOT remove file path    Answer:   \\[email or web address]\[facility]\DXFluoroContrastProtocols.pdf   No orders of the defined types were placed in this encounter.   Historical information moved to improve visibility of documentation.  Past Medical History:  Diagnosis Date  . Chronic kidney disease, stage 2, mildly decreased GFR   . Diabetes mellitus   . Heavy menses   . Hyperlipidemia   . Hypertension   . Obesity    Past Surgical History:  Procedure Laterality Date  . CATARACT EXTRACTION  1-09   right   . CATARACT EXTRACTION  09/2011   left   . CHOLECYSTECTOMY     laproscopic  . rt elbow screws placed  10-82   Social History   Tobacco Use  . Smoking status: Never Smoker  . Smokeless tobacco: Never Used  Substance Use Topics  . Alcohol use: Yes    Comment: rare   family history includes Cancer (age of onset: 38) in her mother; Cancer (age of onset: 7) in her father; Hyperlipidemia in her sister, sister, sister, and sister; Hypertension in her brother, brother, sister, sister, sister, and sister; Lymphoma in her sister.  Medications: Current Outpatient Medications  Medication Sig Dispense Refill  . amLODipine-atorvastatin (CADUET) 10-80 MG tablet Take 1 tablet by mouth daily. 90 tablet 3  . aspirin 81 MG EC tablet Take 81 mg by mouth daily.      . Calcium  Carb-Cholecalciferol (CALTRATE 600+D3 PO) Take 1 tablet by mouth daily.    . Cholecalciferol (VITAMIN D-3) 1000 UNITS CAPS Take 1 tablet by mouth daily.    . diclofenac sodium (VOLTAREN) 1 % GEL Apply 4 g topically 4 (four) times daily. To affected joint. 100 g 11  . fish oil-omega-3 fatty acids 1000 MG capsule Take 1 g by mouth daily.      Marland Kitchen levothyroxine (SYNTHROID, LEVOTHROID) 75 MCG tablet TAKE 1 TABLET DAILY BEFORE BREAKFAST 90 tablet 4  . olmesartan (BENICAR) 40 MG tablet TAKE 1 TABLET DAILY 90 tablet 3  . TOPROL XL 100 MG 24 hr tablet TAKE 1 TABLET DAILY. TAKE WITH OR IMMEDIATELY FOLLOWING A MEAL 90 tablet 4  No current facility-administered medications for this visit.    Allergies  Allergen Reactions  . Synthroid [Levothyroxine] Other (See Comments)    Tongue swelling b/c of filler.      Discussed warning signs or symptoms. Please see discharge instructions. Patient expresses understanding.

## 2019-06-07 ENCOUNTER — Encounter: Payer: Medicare Other | Admitting: Rehabilitative and Restorative Service Providers"

## 2019-06-11 ENCOUNTER — Other Ambulatory Visit: Payer: Self-pay

## 2019-06-11 ENCOUNTER — Ambulatory Visit (INDEPENDENT_AMBULATORY_CARE_PROVIDER_SITE_OTHER): Payer: Medicare Other

## 2019-06-11 DIAGNOSIS — M25512 Pain in left shoulder: Secondary | ICD-10-CM | POA: Diagnosis not present

## 2019-06-13 ENCOUNTER — Ambulatory Visit (INDEPENDENT_AMBULATORY_CARE_PROVIDER_SITE_OTHER): Payer: Medicare Other | Admitting: Family Medicine

## 2019-06-13 ENCOUNTER — Encounter: Payer: Self-pay | Admitting: Family Medicine

## 2019-06-13 ENCOUNTER — Ambulatory Visit: Payer: Medicare Other | Admitting: Family Medicine

## 2019-06-13 ENCOUNTER — Other Ambulatory Visit: Payer: Self-pay

## 2019-06-13 VITALS — BP 141/55 | HR 78 | Temp 98.4°F | Wt 204.0 lb

## 2019-06-13 DIAGNOSIS — M67912 Unspecified disorder of synovium and tendon, left shoulder: Secondary | ICD-10-CM | POA: Diagnosis not present

## 2019-06-13 NOTE — Progress Notes (Signed)
Documented IV assessment on wrong patient. Pt did not receive IV therapy

## 2019-06-13 NOTE — Patient Instructions (Signed)
Thank you for coming in today. Call or go to the ER if you develop a large red swollen joint with extreme pain or oozing puss.   Continue home exercises.  If not better after the shot or the shot does not provide reasonable lasting pain control let me know.  Next step at that time is probably consultation with surgery.   Keep me updated.   Continue home exercises.

## 2019-06-13 NOTE — Progress Notes (Signed)
Emma Craig is a 68 y.o. female who presents to [facility] today for left shoulder pain.  Patient has had left shoulder pain for quite a while now.  She was seen for this in late May.  She had trials of conservative management including physical therapy.  She failed to improve and had MRI recently.  She is here for MRI follow-up and injection.  MRI showed supraspinatus tendinopathy with slight bursal surface fraying.  No rotator cuff tear visible fortunately.  She continues to have shoulder pain especially with overhead motion and reaching back.    ROS:  As above  Exam:  BP (!) 141/55   Pulse 78   Temp 98.4 F (36.9 C) (Oral)   Wt 204 lb (92.5 kg)   LMP  (LMP Unknown)   BMI 37.92 kg/m  Wt Readings from Last 5 Encounters:  06/13/19 204 lb (92.5 kg)  06/06/19 200 lb (90.7 kg)  06/04/19 200 lb (90.7 kg)  04/27/19 199 lb (90.3 kg)  01/29/19 197 lb (89.4 kg)   General: Well Developed, well nourished, and in no acute distress.  Neuro/Psych: Alert and oriented x3, extra-ocular muscles intact, able to move all 4 extremities, sensation grossly intact. Skin: Warm and dry, no rashes noted.  Respiratory: Not using accessory muscles, speaking in full sentences, trachea midline.  Cardiovascular: Pulses palpable, no extremity edema. Abdomen: Does not appear distended. MSK: Left shoulder normal-appearing not particular tender.  Normal range of motion pain with abduction.  Positive empty can test and Hawkins Neer's test. Pulses cap refill and sensation intact distal bilateral upper 70s.    Lab and Radiology Results No results found for this or any previous visit (from the past 72 hour(s)). Mr Shoulder Left Wo Contrast  Result Date: 06/11/2019 CLINICAL DATA:  Acute left shoulder pain EXAM: MRI OF THE LEFT SHOULDER WITHOUT CONTRAST TECHNIQUE: Multiplanar, multisequence MR imaging of the shoulder was performed. No intravenous contrast was  administered. COMPARISON:  None. FINDINGS: Rotator cuff: Moderate tendinosis of the supraspinatus tendon with fraying along the bursal surface. Mild tendinosis of the infraspinatus tendon. Teres minor tendon is intact. Mild tendinosis of the subscapularis tendon. Muscles: No atrophy or fatty replacement of nor abnormal signal within, the muscles of the rotator cuff. Biceps long head:  Intact. Acromioclavicular Joint: Mild arthropathy of the acromioclavicular joint. Type I acromion. Small amount of subacromial/subdeltoid bursal fluid. Glenohumeral Joint: No joint effusion.  No chondral defect. Labrum: Grossly intact, but evaluation is limited by lack of intraarticular fluid. Bones:  No acute osseous abnormality.  No aggressive osseous lesion. IMPRESSION: 1. Moderate tendinosis of the supraspinatus tendon with fraying along the bursal surface. 2. Mild tendinosis of the infraspinatus tendon. 3. Mild tendinosis of the subscapularis tendon. Electronically Signed   By: Kathreen Devoid   On: 06/11/2019 09:40  I personally (independently) visualized and performed the interpretation of the images attached in this note.   Procedure: Real-time Ultrasound Guided Injection of left subacromial bursa Device: GE Logiq E   Images permanently stored and available for review in the ultrasound unit. Verbal informed consent obtained.  Discussed risks and benefits of procedure. Warned about infection bleeding damage to structures skin hypopigmentation and fat atrophy among others. Patient expresses understanding and agreement Time-out conducted.   Noted no overlying erythema, induration, or other signs of local infection.   Skin prepped in a sterile fashion.   Local anesthesia: Topical Ethyl chloride.   With sterile technique and under real time ultrasound guidance:  40 mg of Kenalog and 2 mL of Marcaine injected easily.   Completed without difficulty   Pain immediately resolved suggesting accurate placement of the  medication.   Advised to call if fevers/chills, erythema, induration, drainage, or persistent bleeding.   Images permanently stored and available for review in the ultrasound unit.  Impression: Technically successful ultrasound guided injection.        Assessment and Plan: 68 y.o. female with left shoulder pain due to rotator cuff tendinopathy.  Patient does have some bony impingement which likely is contributing to her pain.  Plan for injection today.  Patient had immediate response to injection which is reassuring.  Continue home exercise program if patient does not have significant lasting benefit following injection she may be a good candidate for arthroscopic debridement.  Discuss MRI findings as well as surgical options and planning and risk and benefits of injection.  I spent 25 minutes with this patient, greater than 50% was face-to-face time counseling regarding MRI surgery injection as above.    Historical information moved to improve visibility of documentation.  Past Medical History:  Diagnosis Date  . Chronic kidney disease, stage 2, mildly decreased GFR   . Diabetes mellitus   . Heavy menses   . Hyperlipidemia   . Hypertension   . Obesity    Past Surgical History:  Procedure Laterality Date  . CATARACT EXTRACTION  1-09   right   . CATARACT EXTRACTION  09/2011   left   . CHOLECYSTECTOMY     laproscopic  . rt elbow screws placed  10-82   Social History   Tobacco Use  . Smoking status: Never Smoker  . Smokeless tobacco: Never Used  Substance Use Topics  . Alcohol use: Yes    Comment: rare   family history includes Cancer (age of onset: 7) in her mother; Cancer (age of onset: 25) in her father; Hyperlipidemia in her sister, sister, sister, and sister; Hypertension in her brother, brother, sister, sister, sister, and sister; Lymphoma in her sister.  Medications: Current Outpatient Medications  Medication Sig Dispense Refill  . amLODipine-atorvastatin  (CADUET) 10-80 MG tablet Take 1 tablet by mouth daily. 90 tablet 3  . aspirin 81 MG EC tablet Take 81 mg by mouth daily.      . Calcium Carb-Cholecalciferol (CALTRATE 600+D3 PO) Take 1 tablet by mouth daily.    . Cholecalciferol (VITAMIN D-3) 1000 UNITS CAPS Take 1 tablet by mouth daily.    . diclofenac sodium (VOLTAREN) 1 % GEL Apply 4 g topically 4 (four) times daily. To affected joint. 100 g 11  . fish oil-omega-3 fatty acids 1000 MG capsule Take 1 g by mouth daily.      Marland Kitchen levothyroxine (SYNTHROID, LEVOTHROID) 75 MCG tablet TAKE 1 TABLET DAILY BEFORE BREAKFAST 90 tablet 4  . olmesartan (BENICAR) 40 MG tablet TAKE 1 TABLET DAILY 90 tablet 3  . TOPROL XL 100 MG 24 hr tablet TAKE 1 TABLET DAILY. TAKE WITH OR IMMEDIATELY FOLLOWING A MEAL 90 tablet 4   No current facility-administered medications for this visit.    Allergies  Allergen Reactions  . Synthroid [Levothyroxine] Other (See Comments)    Tongue swelling b/c of filler.      Discussed warning signs or symptoms. Please see discharge instructions. Patient expresses understanding.

## 2019-07-31 DIAGNOSIS — K269 Duodenal ulcer, unspecified as acute or chronic, without hemorrhage or perforation: Secondary | ICD-10-CM | POA: Insufficient documentation

## 2019-08-01 ENCOUNTER — Telehealth: Payer: Self-pay | Admitting: Family Medicine

## 2019-08-01 DIAGNOSIS — M67912 Unspecified disorder of synovium and tendon, left shoulder: Secondary | ICD-10-CM

## 2019-08-01 DIAGNOSIS — M25512 Pain in left shoulder: Secondary | ICD-10-CM

## 2019-08-01 NOTE — Telephone Encounter (Signed)
PT came in the office wanting some information about surgery. She received a Cortizone shot but still is feeling pain in the left arm/shoulder.  Please Advise.

## 2019-08-02 NOTE — Telephone Encounter (Signed)
I think at this point with the treatment that you have had if you are still having significant pain is reasonable to proceed with surgery evaluation.  I have already placed a referral to [facility] in [place].  However if you would like a different location please let me know.  You should hear from their office soon.  They should have access to your medical records including MRI from July.  If you choose to go outside the system for surgery please go to radiology department and get a CD made of the images so that you can bring with you to your appointment.

## 2019-08-02 NOTE — Telephone Encounter (Signed)
Done

## 2019-08-03 ENCOUNTER — Other Ambulatory Visit: Payer: Self-pay

## 2019-08-03 ENCOUNTER — Encounter: Payer: Self-pay | Admitting: Orthopaedic Surgery

## 2019-08-03 ENCOUNTER — Ambulatory Visit (INDEPENDENT_AMBULATORY_CARE_PROVIDER_SITE_OTHER): Payer: Medicare Other | Admitting: Orthopaedic Surgery

## 2019-08-03 VITALS — Ht 61.0 in | Wt 204.0 lb

## 2019-08-03 DIAGNOSIS — M67912 Unspecified disorder of synovium and tendon, left shoulder: Secondary | ICD-10-CM | POA: Diagnosis not present

## 2019-08-03 MED ORDER — LIDOCAINE HCL 1 % IJ SOLN
3.0000 mL | INTRAMUSCULAR | Status: AC | PRN
  Administered 2019-08-03: 3 mL

## 2019-08-03 MED ORDER — BUPIVACAINE HCL 0.5 % IJ SOLN
3.0000 mL | INTRAMUSCULAR | Status: AC | PRN
  Administered 2019-08-03: 3 mL via INTRA_ARTICULAR

## 2019-08-03 NOTE — Progress Notes (Signed)
Office Visit Note   Patient: Emma Craig           Date of Birth: [birth year]           MRN: [id number] Visit Date: 08/03/2019              Requested by: Emma Hams, MD [address] [place],  [place] [address] PCP: Emma Marry, MD   Assessment & Plan: Visit Diagnoses:  1. Tendinopathy of left rotator cuff     Plan: Impression is left rotator cuff tendinopathy with partial relief from PT and subacromial injection.  I reviewed the MRI images myself and I do agree that she has a fair amount of tendinosis without any full-thickness tears.  I gave her a Toradol injection in the subacromial space today hopefully this will give her some added relief.  She will restart her home exercises.  She understands that if the pain gets to the point where its severe then the next step would be to consider arthroscopic debridement and subacromial decompression.  Questions encouraged and answered.  Follow-up as needed.  Follow-Up Instructions: Return if symptoms worsen or fail to improve.   Orders:  No orders of the defined types were placed in this encounter.  No orders of the defined types were placed in this encounter.     Procedures: Large Joint Inj: L subacromial bursa on 08/03/2019 10:22 AM Indications: pain Details: 22 G needle  Arthrogram: No  Medications: 3 mL lidocaine 1 %; 3 mL bupivacaine 0.5 % Outcome: tolerated well, no immediate complications Patient was prepped and draped in the usual sterile fashion.       Clinical Data: No additional findings.   Subjective: Chief Complaint  Patient presents with  . Left Shoulder - Pain    Emma Craig is a very pleasant 68 year old female comes in for evaluation of left shoulder pain for the last year.  Denies any injuries.  She is attended 12 visits of physical therapy and has made little progress.  She denies any numbness and tingling.  She did have a cortisone injection in July by Dr. Georgina Craig that gave her minimal  relief.  She recently had an MRI that showed tendinosis of her supraspinatus, infraspinatus and subscapularis.  She has no full-thickness tears.  She did state that the pain overall is something that she can generally live with at the current level.   Review of Systems  Constitutional: Negative.   HENT: Negative.   Eyes: Negative.   Respiratory: Negative.   Cardiovascular: Negative.   Endocrine: Negative.   Musculoskeletal: Negative.   Neurological: Negative.   Hematological: Negative.   Psychiatric/Behavioral: Negative.   All other systems reviewed and are negative.    Objective: Vital Signs: Ht 5\' 1"  (1.549 m)   Wt 204 lb (92.5 kg)   LMP  (LMP Unknown)   BMI 38.55 kg/m   Physical Exam Vitals signs and nursing note reviewed.  Constitutional:      Appearance: She is well-developed.  HENT:     Head: Normocephalic and atraumatic.  Neck:     Musculoskeletal: Neck supple.  Pulmonary:     Effort: Pulmonary effort is normal.  Abdominal:     Palpations: Abdomen is soft.  Skin:    General: Skin is warm.     Capillary Refill: Capillary refill takes less than 2 seconds.  Neurological:     Mental Status: She is alert and oriented to person, place, and time.  Psychiatric:  Behavior: Behavior normal.        Thought Content: Thought content normal.        Judgment: Judgment normal.     Ortho Exam Left shoulder exam shows full active and passive range of motion.  Manual muscle testing is normal with very mild pain.  Negative impingement. Specialty Comments:  No specialty comments available.  Imaging: No results found.   PMFS History: Patient Active Problem List   Diagnosis Date Noted  . Tendinopathy of left rotator cuff 08/03/2019  . Elevated ALT measurement 06/04/2019  . Osteoporosis 08/09/2018  . DJD of right AC (acromioclavicular) joint 04/01/2017  . Rotator cuff tendonitis, right 04/01/2017  . Fatty liver 03/22/2016  . CKD (chronic kidney disease) stage  3, GFR 30-59 ml/min ([facility]) 03/16/2016  . Hypothyroidism 11/01/2008  . Severe obesity (BMI 35.0-35.9 with comorbidity) ([facility]) 08/27/2008  . SLEEP DISORDER 01/17/2008  . Diabetes mellitus without complication ([facility]) 99991111  . HYPERLIPIDEMIA 09/14/2006  . Essential hypertension 09/14/2006   Past Medical History:  Diagnosis Date  . Chronic kidney disease, stage 2, mildly decreased GFR   . Diabetes mellitus   . Heavy menses   . Hyperlipidemia   . Hypertension   . Obesity     Family History  Problem Relation Age of Onset  . Cancer Father 72       colon and prostate CA  . Cancer Mother 82       carcinoid tumor/with mets to brain  . Hypertension Sister   . Hyperlipidemia Sister   . Hypertension Brother   . Hypertension Brother   . Hypertension Sister   . Hyperlipidemia Sister   . Hypertension Sister   . Hyperlipidemia Sister   . Hypertension Sister   . Hyperlipidemia Sister   . Lymphoma Sister        non-hodgkins     Past Surgical History:  Procedure Laterality Date  . CATARACT EXTRACTION  1-09   right   . CATARACT EXTRACTION  09/2011   left   . CHOLECYSTECTOMY     laproscopic  . rt elbow screws placed  10-82   Social History   Occupational History  . Not on file  Tobacco Use  . Smoking status: Never Smoker  . Smokeless tobacco: Never Used  Substance and Sexual Activity  . Alcohol use: Yes    Comment: rare  . Drug use: Not on file  . 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

## 2019-08-09 ENCOUNTER — Ambulatory Visit (INDEPENDENT_AMBULATORY_CARE_PROVIDER_SITE_OTHER): Payer: Medicare Other | Admitting: Physician Assistant

## 2019-08-09 ENCOUNTER — Other Ambulatory Visit: Payer: Self-pay

## 2019-08-09 ENCOUNTER — Encounter: Payer: Self-pay | Admitting: Physician Assistant

## 2019-08-09 VITALS — BP 105/69 | HR 89 | Temp 97.7°F | Wt 187.0 lb

## 2019-08-09 DIAGNOSIS — K921 Melena: Secondary | ICD-10-CM

## 2019-08-09 DIAGNOSIS — R918 Other nonspecific abnormal finding of lung field: Secondary | ICD-10-CM | POA: Diagnosis not present

## 2019-08-09 DIAGNOSIS — D649 Anemia, unspecified: Secondary | ICD-10-CM | POA: Insufficient documentation

## 2019-08-09 DIAGNOSIS — R55 Syncope and collapse: Secondary | ICD-10-CM | POA: Diagnosis not present

## 2019-08-09 DIAGNOSIS — R1013 Epigastric pain: Secondary | ICD-10-CM | POA: Diagnosis not present

## 2019-08-09 DIAGNOSIS — R1032 Left lower quadrant pain: Secondary | ICD-10-CM

## 2019-08-09 LAB — COMPLETE METABOLIC PANEL WITH GFR
AG Ratio: 1.9 (calc) (ref 1.0–2.5)
ALT: 25 U/L (ref 6–29)
AST: 19 U/L (ref 10–35)
Albumin: 3.9 g/dL (ref 3.6–5.1)
Alkaline phosphatase (APISO): 69 U/L (ref 37–153)
BUN: 18 mg/dL (ref 7–25)
CO2: 27 mmol/L (ref 20–32)
Calcium: 9.6 mg/dL (ref 8.6–10.4)
Chloride: 103 mmol/L (ref 98–110)
Creat: 0.86 mg/dL (ref 0.50–0.99)
GFR, Est African American: 82 mL/min/{1.73_m2} (ref >=60)
GFR, Est Non African American: 70 mL/min/{1.73_m2} (ref >=60)
Globulin: 2.1 g/dL (calc) (ref 1.9–3.7)
Glucose, Bld: 125 mg/dL — ABNORMAL HIGH (ref 65–99)
Potassium: 3.9 mmol/L (ref 3.5–5.3)
Sodium: 140 mmol/L (ref 135–146)
Total Bilirubin: 0.3 mg/dL (ref 0.2–1.2)
Total Protein: 6 g/dL — ABNORMAL LOW (ref 6.1–8.1)

## 2019-08-09 LAB — CBC WITH DIFFERENTIAL/PLATELET
Absolute Monocytes: 702 cells/uL (ref 200–950)
Basophils Absolute: 65 cells/uL (ref 0–200)
Basophils Relative: 0.6 %
Eosinophils Absolute: 324 cells/uL (ref 15–500)
Eosinophils Relative: 3 %
HCT: 29.4 % — ABNORMAL LOW (ref 35.0–45.0)
Hemoglobin: 9.8 g/dL — ABNORMAL LOW (ref 11.7–15.5)
Lymphs Abs: 3434 cells/uL (ref 850–3900)
MCH: 30.3 pg (ref 27.0–33.0)
MCHC: 33.3 g/dL (ref 32.0–36.0)
MCV: 91 fL (ref 80.0–100.0)
MPV: 10.9 fL (ref 7.5–12.5)
Monocytes Relative: 6.5 %
Neutro Abs: 6275 cells/uL (ref 1500–7800)
Neutrophils Relative %: 58.1 %
Platelets: 303 10*3/uL (ref 140–400)
RBC: 3.23 10*6/uL — ABNORMAL LOW (ref 3.80–5.10)
RDW: 13.2 % (ref 11.0–15.0)
Total Lymphocyte: 31.8 %
WBC: 10.8 10*3/uL (ref 3.8–10.8)

## 2019-08-09 LAB — POCT HEMOGLOBIN: Hemoglobin: 8.9 g/dL — AB (ref 11–14.6)

## 2019-08-09 LAB — LIPASE: Lipase: 27 U/L (ref 7–60)

## 2019-08-09 MED ORDER — PANTOPRAZOLE SODIUM 40 MG PO TBEC: 40.0000 mg | DELAYED_RELEASE_TABLET | Freq: Every day | ORAL | 0 refills | Status: DC

## 2019-08-09 NOTE — Progress Notes (Signed)
HPI:                                                                Emma Craig is a 68 y.o. female who presents to [facility] today for syncope  History is provided by patient and her husband, Konrad Dolores  Reports syncopal episode at home 3 days ago (Monday 9/07). Patient reports feeling unwell and weak for most of that day prior to the episode. States she became lightheaded after standing from seated position on the couch and trying to walk across her living room. Reports she lowered herself to a chair. Husband reports that she slumped over in the chair and he lowered her to the ground. She lost consciousness for under a minute.  She reports for the last week or so she has been constipated, bloated and had intermittent upper abdominal pain in her epigastric area that occurs about 10 minutes after eating. Pain has been developing in her left side as well. She self-treated constipation with Benefiber and Miralax. She had a single episode of dark black, tarry stool this morning.   She reports last colonoscopy 5 years ago showed diverticulosis.  Denies hx of diverticulitis, colitis, PUD, GI bleed. She is s/p cholecystectomy.  Denies alcohol or NSAID use.   Past Medical History:  Diagnosis Date  . Chronic kidney disease, stage 2, mildly decreased GFR   . Diabetes mellitus   . Heavy menses   . Hyperlipidemia   . Hypertension   . Obesity    Past Surgical History:  Procedure Laterality Date  . CATARACT EXTRACTION  1-09   right   . CATARACT EXTRACTION  09/2011   left   . CHOLECYSTECTOMY     laproscopic  . rt elbow screws placed  10-82   Social History   Tobacco Use  . Smoking status: Never Smoker  . Smokeless tobacco: Never Used  Substance Use Topics  . Alcohol use: Yes    Comment: rare   family history includes Cancer (age of onset: 28) in her mother; Cancer (age of onset: 57) in her father; Hyperlipidemia in her sister, sister,  sister, and sister; Hypertension in her brother, brother, sister, sister, sister, and sister; Lymphoma in her sister.    ROS: negative except as noted in the HPI  Medications: Current Outpatient Medications  Medication Sig Dispense Refill  . amLODipine-atorvastatin (CADUET) 10-80 MG tablet Take 1 tablet by mouth daily. 90 tablet 3  . aspirin 81 MG EC tablet Take 81 mg by mouth daily.      . Calcium Carb-Cholecalciferol (CALTRATE 600+D3 PO) Take 1 tablet by mouth daily.    . Cholecalciferol (VITAMIN D-3) 1000 UNITS CAPS Take 1 tablet by mouth daily.    . fish oil-omega-3 fatty acids 1000 MG capsule Take 1 g by mouth daily.      Marland Kitchen levothyroxine (SYNTHROID, LEVOTHROID) 75 MCG tablet TAKE 1 TABLET DAILY BEFORE BREAKFAST 90 tablet 4  . olmesartan (BENICAR) 40 MG tablet TAKE 1 TABLET DAILY 90 tablet 3  . TOPROL XL 100 MG 24 hr tablet TAKE 1 TABLET DAILY. TAKE WITH OR IMMEDIATELY FOLLOWING A MEAL 90 tablet 4  . pantoprazole (PROTONIX) 40 MG tablet Take 1 tablet (40 mg total) by mouth daily. [place]  tablet 0   No current facility-administered medications for this visit.    Allergies  Allergen Reactions  . Synthroid [Levothyroxine] Other (See Comments)    Tongue swelling b/c of filler.       Objective:  BP 105/69   Pulse 89   Temp 97.7 F (36.5 C) (Oral)   Wt 187 lb (84.8 kg)   LMP  (LMP Unknown)   SpO2 98%   BMI 35.33 kg/m   Wt Readings from Last 3 Encounters:  08/09/19 187 lb (84.8 kg)  08/03/19 204 lb (92.5 kg)  06/13/19 204 lb (92.5 kg)   Temp Readings from Last 3 Encounters:  08/09/19 97.7 F (36.5 C) (Oral)  06/13/19 98.4 F (36.9 C) (Oral)  06/06/19 98.3 F (36.8 C) (Oral)   BP Readings from Last 3 Encounters:  08/09/19 105/69  06/13/19 (!) 141/55  06/06/19 (!) 139/57   Pulse Readings from Last 3 Encounters:  08/09/19 89  06/13/19 78  06/06/19 77    Gen:  alert, not ill-appearing, no distress, appropriate for age, obese female HEENT: head normocephalic  without obvious abnormality, conjunctiva and cornea clear, moist mucous membranes, oropharynx clear, neck supple, no cervical adenopathy, trachea midline Pulm: Normal work of breathing, normal phonation, clear to auscultation bilaterally, no wheezes, rales or rhonchi CV: Normal rate, regular rhythm, s1 and s2 distinct, grade II/VI systolic murmur, no clicks or rubs  GI: abdomen soft, there is epigastric and left-sided tenderness without guarding, no rebound, exam limited due to body habitus Neuro: alert and oriented x 3, no tremor MSK: extremities atraumatic, normal gait and station Skin: intact, no rashes on exposed skin, no jaundice, no cyanosis   Results for orders placed or performed in visit on 08/09/19 (from the past 72 hour(s))  POCT hemoglobin     Status: Abnormal   Collection Time: 08/09/19  3:56 PM  Result Value Ref Range   Hemoglobin 8.9 (A) 11 - 14.6 g/dL   No results found.  ECG 08/09/19 Vent rate 91 bpm PR-I 168 ms QRS 70 ms QT/QTc 366/450 ms Normal sinus rhythm  Assessment and Plan: 68 y.o. female with   .[place] was seen today for loss of consciousness.  Diagnoses and all orders for this visit:  Syncope, unspecified syncope type -     EKG 12-Lead  Melena -     POCT hemoglobin -     Ambulatory referral to Gastroenterology -     CBC with Differential/Platelet -     COMPLETE METABOLIC PANEL WITH GFR -     Lipase -     CT Abdomen Pelvis W Contrast -     pantoprazole (PROTONIX) 40 MG tablet; Take 1 tablet (40 mg total) by mouth daily.  Anemia, unspecified type -     Ambulatory referral to Gastroenterology -     CBC with Differential/Platelet -     COMPLETE METABOLIC PANEL WITH GFR -     Lipase  Epigastric pain -     pantoprazole (PROTONIX) 40 MG tablet; Take 1 tablet (40 mg total) by mouth daily.  Colicky LLQ abdominal pain -     Ambulatory referral to Gastroenterology -     CBC with Differential/Platelet -     COMPLETE METABOLIC PANEL WITH GFR -      Lipase -     CT Abdomen Pelvis W Contrast  Left lower quadrant abdominal pain -     CT Abdomen Pelvis W Contrast    Patient is afebrile, not ill appearing. BP  is normotensive but on the low end for patient, no tachycardia. There is mild tenderness on exam but no peritoneal signs In the setting of melena and new onset anemia (POC Hgb 8.9), leading DDx includes PUD and diverticulitis Syncope is likely 2/2 anemia STAT labs pending Will obtain CT abdomen w/contrast first thing in the morning Strict ER precautions reviewed with patient including any additional syncope, worsening abdominal pain, vomiting, or passing large amounts of blood in stool Start Protonix 40 mg QD and continue until f/u with GI. Counseled bland diet. Urgent referral placed to GI  Patient education and anticipatory guidance given Patient agrees with treatment plan Follow-up as needed if symptoms worsen or fail to improve  Darlyne Russian PA-C

## 2019-08-09 NOTE — Patient Instructions (Addendum)
Peptic Ulcer Eating Plan Peptic ulcers are sores that form on the lining of the stomach, esophagus, or the part of the small intestine that is attached to the stomach (duodenum). These sores are also called stomach ulcers. When ulcers develop, they can cause a burning feeling in the stomach as well as bloating, nausea, vomiting, and poor appetite. If you have a history of peptic ulcers, it is important to keep track of what foods and drinks cause symptoms. What are tips for following this plan?   Eat a healthy, well-balanced diet. This includes: ? Fresh fruits and vegetables. Eat a variety of colors of fruits and vegetables. ? Whole grains. Try to make sure at least half of the grains you eat each day are whole grains. ? Low-fat dairy. ? Lean meat, fish, poultry, eggs, beans, and nuts. ? Healthy fats, such as olive oil, grapeseed oil, or canola oil. Try to eat less than 8 teaspoons of fats and oils each day.  Avoid foods that cause irritation or pain. These may be different for different people. Keep a food diary to identify foods that cause symptoms.  Avoid processed foods that have added salt and sugar.  Avoid drinking alcohol.  Avoid drinks with caffeine, such as cola, black tea, energy drinks, and coffee. Recommended foods Grains  Whole grains. Vegetables  All fresh or frozen vegetables. Low-sodium canned vegetables. Fruits  All fresh, frozen, or dried fruit. Fruit canned in juice. Meats and other protein foods  Lean cuts of meat. Skinless poultry. Fresh or canned fish. Eggs. Tofu. Nuts and nut butter. Dried beans. Low-sodium canned beans. Dairy  Low-fat or nonfat (skim) milk. Nonfat or low-fat yogurt. Nonfat or low-fat cheese. Beverages  Water. Soy or nut milks. Caffeine-free soft drinks. Herbal tea. Fats and oils  Olive oil. Canola oil. Grapeseed oil. Sunflower oil. Seasoning and other foods  Low-fat salad dressing. Ketchup. Low-fat mayonnaise. All spices except  pepper. Low-sodium seasoning mixes. Foods to avoid Meats and other protein foods  Fatty meats. Fried meats. Any meat that causes symptoms. Dairy  Whole milk. Ice cream. Cream. Chocolate milk. Beverages  Alcohol. Coffee. Cola and energy drinks. Black or green tea. Cocoa. Fats and oils  Butter. Lard. Ghee. Seasoning and other foods  Pepper. Hot sauce. Any seasonings or condiments that cause symptoms. Summary  Peptic ulcers can cause burning in the stomach as well as bloating, nausea, vomiting, and poor appetite. You may be able to limit symptoms by avoiding foods that make you feel worse.  Work with your dietitian or health care provider to identify foods that cause symptoms. This may include caffeinated drinks, alcohol, or pepper. This information is not intended to replace advice given to you by your health care provider. Make sure you discuss any questions you have with your health care provider. Document Released: 02/07/2012 Document Revised: 10/28/2017 Document Reviewed: 12/27/2016 Elsevier Patient Education  [place].   Diverticulitis  Diverticulitis is when small pockets in your large intestine (colon) get infected or swollen. This causes stomach pain and watery poop (diarrhea). These pouches are called diverticula. They form in people who have a condition called diverticulosis. Follow these instructions at home: Medicines  Take over-the-counter and prescription medicines only as told by your doctor. These include: ? Antibiotics. ? Pain medicines. ? Fiber pills. ? Probiotics. ? Stool softeners.  Do not drive or use heavy machinery while taking prescription pain medicine.  If you were prescribed an antibiotic, take it as told. Do not stop taking it  even if you feel better. General instructions   Follow a diet as told by your doctor.  When you feel better, your doctor may tell you to change your diet. You may need to eat a lot of fiber. Fiber makes it  easier to poop (have bowel movements). Healthy foods with fiber include: ? Berries. ? Beans. ? Lentils. ? Green vegetables.  Exercise 3 or more times a week. Aim for 30 minutes each time. Exercise enough to sweat and make your heart beat faster.  Keep all follow-up visits as told. This is important. You may need to have an exam of the large intestine. This is called a colonoscopy. Contact a doctor if:  Your pain does not get better.  You have a hard time eating or drinking.  You are not pooping like normal. Get help right away if:  Your pain gets worse.  Your problems do not get better.  Your problems get worse very fast.  You have a fever.  You throw up (vomit) more than one time.  You have poop that is: ? Bloody. ? Black. ? Tarry. Summary  Diverticulitis is when small pockets in your large intestine (colon) get infected or swollen.  Take medicines only as told by your doctor.  Follow a diet as told by your doctor. This information is not intended to replace advice given to you by your health care provider. Make sure you discuss any questions you have with your health care provider. Document Released: 05/03/2008 Document Revised: 10/28/2017 Document Reviewed: 12/02/2016 Elsevier Patient Education  2020 [employer].

## 2019-08-10 ENCOUNTER — Ambulatory Visit (HOSPITAL_BASED_OUTPATIENT_CLINIC_OR_DEPARTMENT_OTHER)
Admission: RE | Admit: 2019-08-10 | Discharge: 2019-08-10 | Disposition: A | Discharge status: Home / Self Care | Payer: Medicare Other | Source: Ambulatory Visit | Attending: Physician Assistant | Admitting: Physician Assistant

## 2019-08-10 DIAGNOSIS — Z888 Allergy status to other drugs, medicaments and biological substances status: Secondary | ICD-10-CM | POA: Diagnosis not present

## 2019-08-10 DIAGNOSIS — K921 Melena: Secondary | ICD-10-CM | POA: Diagnosis not present

## 2019-08-10 DIAGNOSIS — I10 Essential (primary) hypertension: Secondary | ICD-10-CM | POA: Diagnosis not present

## 2019-08-10 DIAGNOSIS — K429 Umbilical hernia without obstruction or gangrene: Secondary | ICD-10-CM | POA: Diagnosis not present

## 2019-08-10 DIAGNOSIS — Z7982 Long term (current) use of aspirin: Secondary | ICD-10-CM | POA: Diagnosis not present

## 2019-08-10 DIAGNOSIS — D62 Acute posthemorrhagic anemia: Secondary | ICD-10-CM | POA: Diagnosis not present

## 2019-08-10 DIAGNOSIS — R1032 Left lower quadrant pain: Secondary | ICD-10-CM | POA: Diagnosis not present

## 2019-08-10 DIAGNOSIS — R918 Other nonspecific abnormal finding of lung field: Secondary | ICD-10-CM | POA: Insufficient documentation

## 2019-08-10 DIAGNOSIS — R531 Weakness: Secondary | ICD-10-CM | POA: Diagnosis not present

## 2019-08-10 DIAGNOSIS — E785 Hyperlipidemia, unspecified: Secondary | ICD-10-CM | POA: Diagnosis not present

## 2019-08-10 DIAGNOSIS — I700 Atherosclerosis of aorta: Secondary | ICD-10-CM | POA: Diagnosis not present

## 2019-08-10 DIAGNOSIS — R42 Dizziness and giddiness: Secondary | ICD-10-CM | POA: Diagnosis not present

## 2019-08-10 DIAGNOSIS — E119 Type 2 diabetes mellitus without complications: Secondary | ICD-10-CM | POA: Diagnosis not present

## 2019-08-10 DIAGNOSIS — K922 Gastrointestinal hemorrhage, unspecified: Secondary | ICD-10-CM | POA: Diagnosis not present

## 2019-08-10 DIAGNOSIS — E039 Hypothyroidism, unspecified: Secondary | ICD-10-CM | POA: Diagnosis not present

## 2019-08-10 DIAGNOSIS — Z800 Family history of malignant neoplasm of digestive organs: Secondary | ICD-10-CM | POA: Diagnosis not present

## 2019-08-10 DIAGNOSIS — K269 Duodenal ulcer, unspecified as acute or chronic, without hemorrhage or perforation: Secondary | ICD-10-CM | POA: Diagnosis not present

## 2019-08-10 DIAGNOSIS — Z79899 Other long term (current) drug therapy: Secondary | ICD-10-CM | POA: Diagnosis not present

## 2019-08-10 DIAGNOSIS — Z20828 Contact with and (suspected) exposure to other viral communicable diseases: Secondary | ICD-10-CM | POA: Diagnosis not present

## 2019-08-10 DIAGNOSIS — Z6835 Body mass index (BMI) 35.0-35.9, adult: Secondary | ICD-10-CM | POA: Diagnosis not present

## 2019-08-10 DIAGNOSIS — R55 Syncope and collapse: Secondary | ICD-10-CM | POA: Diagnosis not present

## 2019-08-10 MED ORDER — IOHEXOL 300 MG/ML  SOLN
100.0000 mL | Freq: Once | INTRAMUSCULAR | Status: AC | PRN
  Administered 2019-08-10: 100 mL via INTRAVENOUS

## 2019-08-10 MED ORDER — AZITHROMYCIN 250 MG PO TABS: ORAL_TABLET | ORAL | 0 refills | Status: DC

## 2019-08-10 NOTE — Addendum Note (Signed)
Addended by: Nelson Chimes E on: 08/10/2019 12:20 PM   Modules accepted: Orders

## 2019-08-11 DIAGNOSIS — K2970 Gastritis, unspecified, without bleeding: Secondary | ICD-10-CM | POA: Diagnosis not present

## 2019-08-11 DIAGNOSIS — K269 Duodenal ulcer, unspecified as acute or chronic, without hemorrhage or perforation: Secondary | ICD-10-CM | POA: Diagnosis not present

## 2019-08-11 DIAGNOSIS — N1831 Chronic kidney disease, stage 3a: Secondary | ICD-10-CM | POA: Insufficient documentation

## 2019-08-11 DIAGNOSIS — E785 Hyperlipidemia, unspecified: Secondary | ICD-10-CM | POA: Diagnosis not present

## 2019-08-11 DIAGNOSIS — E119 Type 2 diabetes mellitus without complications: Secondary | ICD-10-CM | POA: Diagnosis not present

## 2019-08-11 DIAGNOSIS — Z888 Allergy status to other drugs, medicaments and biological substances status: Secondary | ICD-10-CM | POA: Diagnosis not present

## 2019-08-11 DIAGNOSIS — K922 Gastrointestinal hemorrhage, unspecified: Secondary | ICD-10-CM | POA: Diagnosis not present

## 2019-08-11 DIAGNOSIS — K921 Melena: Secondary | ICD-10-CM | POA: Diagnosis not present

## 2019-08-11 DIAGNOSIS — D62 Acute posthemorrhagic anemia: Secondary | ICD-10-CM | POA: Diagnosis not present

## 2019-08-11 DIAGNOSIS — K2950 Unspecified chronic gastritis without bleeding: Secondary | ICD-10-CM | POA: Diagnosis not present

## 2019-08-11 DIAGNOSIS — R1032 Left lower quadrant pain: Secondary | ICD-10-CM | POA: Diagnosis not present

## 2019-08-11 DIAGNOSIS — Z20828 Contact with and (suspected) exposure to other viral communicable diseases: Secondary | ICD-10-CM | POA: Diagnosis not present

## 2019-08-11 DIAGNOSIS — R1012 Left upper quadrant pain: Secondary | ICD-10-CM | POA: Diagnosis not present

## 2019-08-11 DIAGNOSIS — Z79899 Other long term (current) drug therapy: Secondary | ICD-10-CM | POA: Diagnosis not present

## 2019-08-11 DIAGNOSIS — I10 Essential (primary) hypertension: Secondary | ICD-10-CM | POA: Diagnosis not present

## 2019-08-11 DIAGNOSIS — E039 Hypothyroidism, unspecified: Secondary | ICD-10-CM | POA: Diagnosis not present

## 2019-08-11 DIAGNOSIS — Z6835 Body mass index (BMI) 35.0-35.9, adult: Secondary | ICD-10-CM | POA: Diagnosis not present

## 2019-08-12 DIAGNOSIS — K921 Melena: Secondary | ICD-10-CM | POA: Diagnosis not present

## 2019-08-12 DIAGNOSIS — I10 Essential (primary) hypertension: Secondary | ICD-10-CM | POA: Diagnosis not present

## 2019-08-12 DIAGNOSIS — K922 Gastrointestinal hemorrhage, unspecified: Secondary | ICD-10-CM | POA: Diagnosis not present

## 2019-08-12 DIAGNOSIS — E119 Type 2 diabetes mellitus without complications: Secondary | ICD-10-CM | POA: Diagnosis not present

## 2019-08-12 DIAGNOSIS — K269 Duodenal ulcer, unspecified as acute or chronic, without hemorrhage or perforation: Secondary | ICD-10-CM | POA: Diagnosis not present

## 2019-08-12 MED ORDER — NITROGLYCERIN 0.4 MG SL SUBL
0.40 | SUBLINGUAL_TABLET | SUBLINGUAL | Status: DC
Start: ? — End: 2019-08-12

## 2019-08-12 MED ORDER — PHENYLEPHRINE-GUAIFENESIN 20-375 MG PO CP12
10.00 | ORAL_CAPSULE | ORAL | Status: DC
Start: ? — End: 2019-08-12

## 2019-08-12 MED ORDER — GENERIC EXTERNAL MEDICATION
Status: DC
Start: ? — End: 2019-08-12

## 2019-08-12 MED ORDER — OATMEAL BATH OILATED EX PACK
200.00 | PACK | CUTANEOUS | Status: DC
Start: ? — End: 2019-08-12

## 2019-08-12 MED ORDER — ASTELIN 137 MCG/SPRAY NA SOLN
40.00 | NASAL | Status: DC
Start: 2019-08-12 — End: 2019-08-12

## 2019-08-12 MED ORDER — ASPIRIN-SALICYLAMIDE-CAFFEINE 650-195-32 MG PO PACK
75.00 | PACK | ORAL | Status: DC
Start: 2019-08-13 — End: 2019-08-12

## 2019-08-12 MED ORDER — ALUM & MAG HYDROXIDE-SIMETH 200-200-20 MG/5ML PO SUSP
30.00 | ORAL | Status: DC
Start: ? — End: 2019-08-12

## 2019-08-12 MED ORDER — METOPROLOL SUCCINATE ER 50 MG PO TB24
50.00 | ORAL_TABLET | ORAL | Status: DC
Start: 2019-08-13 — End: 2019-08-12

## 2019-08-12 MED ORDER — ASPIRIN 81 MG PO CHEW
81.00 | CHEWABLE_TABLET | ORAL | Status: DC
Start: 2019-08-13 — End: 2019-08-12

## 2019-08-12 MED ORDER — ACCU-PRO PUMP SET/VENT MISC
2.50 | Status: DC
Start: ? — End: 2019-08-12

## 2019-08-12 MED ORDER — Medication
Status: DC
Start: ? — End: 2019-08-12

## 2019-08-12 MED ORDER — BARO-CAT PO
10.00 | ORAL | Status: DC
Start: ? — End: 2019-08-12

## 2019-08-12 MED ORDER — Medication
125.00 | Status: DC
Start: 2019-08-13 — End: 2019-08-12

## 2019-08-12 MED ORDER — SENNA-DOCUSATE SODIUM 8.6-50 MG PO TABS
1.00 | ORAL_TABLET | ORAL | Status: DC
Start: ? — End: 2019-08-12

## 2019-08-20 DIAGNOSIS — Z800 Family history of malignant neoplasm of digestive organs: Secondary | ICD-10-CM | POA: Diagnosis not present

## 2019-08-20 DIAGNOSIS — D649 Anemia, unspecified: Secondary | ICD-10-CM | POA: Diagnosis not present

## 2019-08-20 DIAGNOSIS — Z09 Encounter for follow-up examination after completed treatment for conditions other than malignant neoplasm: Secondary | ICD-10-CM | POA: Diagnosis not present

## 2019-08-20 DIAGNOSIS — K269 Duodenal ulcer, unspecified as acute or chronic, without hemorrhage or perforation: Secondary | ICD-10-CM | POA: Diagnosis not present

## 2019-08-21 ENCOUNTER — Encounter: Payer: Self-pay | Admitting: Family Medicine

## 2019-08-21 DIAGNOSIS — Z8719 Personal history of other diseases of the digestive system: Secondary | ICD-10-CM | POA: Insufficient documentation

## 2019-08-23 ENCOUNTER — Ambulatory Visit (INDEPENDENT_AMBULATORY_CARE_PROVIDER_SITE_OTHER): Payer: Medicare Other | Admitting: Family Medicine

## 2019-08-23 ENCOUNTER — Other Ambulatory Visit: Payer: Self-pay

## 2019-08-23 ENCOUNTER — Encounter: Payer: Self-pay | Admitting: Family Medicine

## 2019-08-23 VITALS — BP 126/64 | HR 62 | Ht 61.0 in | Wt 190.0 lb

## 2019-08-23 DIAGNOSIS — D649 Anemia, unspecified: Secondary | ICD-10-CM

## 2019-08-23 DIAGNOSIS — E1169 Type 2 diabetes mellitus with other specified complication: Secondary | ICD-10-CM | POA: Diagnosis not present

## 2019-08-23 DIAGNOSIS — K264 Chronic or unspecified duodenal ulcer with hemorrhage: Secondary | ICD-10-CM | POA: Insufficient documentation

## 2019-08-23 DIAGNOSIS — E118 Type 2 diabetes mellitus with unspecified complications: Secondary | ICD-10-CM | POA: Diagnosis not present

## 2019-08-23 DIAGNOSIS — K921 Melena: Secondary | ICD-10-CM

## 2019-08-23 DIAGNOSIS — I10 Essential (primary) hypertension: Secondary | ICD-10-CM

## 2019-08-23 DIAGNOSIS — E785 Hyperlipidemia, unspecified: Secondary | ICD-10-CM

## 2019-08-23 MED ORDER — ATORVASTATIN CALCIUM 40 MG PO TABS: 40.0000 mg | ORAL_TABLET | Freq: Every day | ORAL | 0 refills | Status: DC

## 2019-08-23 NOTE — Assessment & Plan Note (Signed)
Plan to start atorvastatin since she cannot take the Caduet because her blood pressures have actually been really well regulated and in fact still a little bit low.  This may be secondary to the weight loss some medical head and start atorvastatin 40 mg that she was previously on 80.  If she tolerates it well without side effects then we can work on increasing it.

## 2019-08-23 NOTE — Assessment & Plan Note (Signed)
Discussed that we need to get her back on a statin.  We will start atorvastatin just an over 30-day supply for now since we are holding the Caduet but if her blood pressure starts to creep back up then we may end up restarting it.  Just encouraged her to hold onto it and not toss it.

## 2019-08-23 NOTE — Assessment & Plan Note (Signed)
Blood pressure well controlled.  For now we will just stick with the metoprolol she is doing great on that.  Continue to hold the Caduet.  We will continue to hold her R.  In the next couple weeks if she starts feeling better and doing more her blood pressure might go up so want her to continue to monitor her pressure and like to have her come back in about 3 to 4 weeks so that we can make any adjustments needed at that time.

## 2019-08-23 NOTE — Assessment & Plan Note (Signed)
They did recheck her CBC earlier this week when she was seen by GI and her hemoglobin is now up to 10 which is great.  They have dropped her iron down to 1 tab daily.

## 2019-08-23 NOTE — Progress Notes (Signed)
Established Patient Office Visit  Subjective:  Patient ID: Emma Craig, female    DOB: 1951-09-16  Age: 68 y.o. MRN: [id number]  CC:  Chief Complaint  Patient presents with  . Hospitalization Follow-up    HPI Emma Craig presents for hospital follow-up.  She was originally seen on September 10th by 1 of my partners for syncope epigastric pain and melena.  She was then seen by GI, Dr. Alvester Chou at [facility] on September 11.  Her hemoglobin was low at 8.9 from previous of 12.9.  Outpatient CT did not reveal a bleeding source.  She was sent to the emergency department and admitted on September 11 and discharged 2 days later on September 13 for duodenal ulcer with melena and GI hemorrhage.  They discontinued her aspirin and omega-3 and gave her iron and pantoprazole upon discharge.  Have now dropped her iron tabs down to 1 a day they want her to continue with the pantoprazole twice a day for at least another month before dropping down to once a day.  While she was hospitalized her blood pressures were actually really well controlled on metoprolol alone so they actually held her Caduet and her Benicar.  She plan to monitor her blood pressures at home after that.  Been checking her blood pressures at home with a home blood pressure cuff.  She is only been able to do the pressures on her right arm because where they had her IV and on the left it still little sore and swollen but it is gradually getting better.  Home blood pressures have been ranging from a systolic of 99 to about [id number].  Most of them are close to [id number] systolic.  She is no longer been feeling lightheaded or dizzy which is great.  She has lost some weight.  And is doing well with that.  Past Medical History:  Diagnosis Date  . Chronic kidney disease, stage 2, mildly decreased GFR   . Diabetes mellitus   . Heavy menses   . Hyperlipidemia   . Hypertension   . Obesity     Past Surgical History:  Procedure  Laterality Date  . CATARACT EXTRACTION  1-09   right   . CATARACT EXTRACTION  09/2011   left   . CHOLECYSTECTOMY     laproscopic  . rt elbow screws placed  10-82    Family History  Problem Relation Age of Onset  . Cancer Father 62       colon and prostate CA  . Cancer Mother 65       carcinoid tumor/with mets to brain  . Hypertension Sister   . Hyperlipidemia Sister   . Hypertension Brother   . Hypertension Brother   . Hypertension Sister   . Hyperlipidemia Sister   . Hypertension Sister   . Hyperlipidemia Sister   . Hypertension Sister   . Hyperlipidemia Sister   . Lymphoma Sister        non-hodgkins     Social History   Socioeconomic History  . Marital status: Married    Spouse name: Not on file  . Number of children: Not on file  . Years of education: Not on file  . Highest education level: Not on file  Occupational History  . Not on file  Social Needs  . Financial resource strain: Not on file  . Food insecurity    Worry: Not on file    Inability: Not on file  . Transportation needs  Medical: Not on file    Non-medical: Not on file  Tobacco Use  . Smoking status: Never Smoker  . Smokeless tobacco: Never Used  Substance and Sexual Activity  . Alcohol use: Yes    Comment: rare  . Drug use: Not on file  . 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  Lifestyle  . Physical activity    Days per week: Not on file    Minutes per session: Not on file  . Stress: Not on file  Relationships  . Social Herbalist on phone: Not on file    Gets together: Not on file    Attends religious service: Not on file    Active member of club or organization: Not on file    Attends meetings of clubs or organizations: Not on file    Relationship status: Not on file  . Intimate partner violence    Fear of current or ex partner: Not on file     Emotionally abused: Not on file    Physically abused: Not on file    Forced sexual activity: Not on file  Other Topics Concern  . Not on file  Social History Narrative   She cloggs for fun.      Outpatient Medications Prior to Visit  Medication Sig Dispense Refill  . Cholecalciferol (VITAMIN D-3) 1000 UNITS CAPS Take 1 tablet by mouth daily.    . ferrous sulfate 325 (65 FE) MG tablet Take 325 mg by mouth 2 (two) times daily.    Marland Kitchen levothyroxine (SYNTHROID, LEVOTHROID) 75 MCG tablet TAKE 1 TABLET DAILY BEFORE BREAKFAST 90 tablet 4  . metoprolol succinate (TOPROL-XL) 50 MG 24 hr tablet Take by mouth.    . pantoprazole (PROTONIX) 40 MG tablet Take 40 mg by mouth 2 (two) times daily.     Marland Kitchen amLODipine-atorvastatin (CADUET) 10-80 MG tablet Take 1 tablet by mouth daily. (Patient not taking: Reported on 08/23/2019) 90 tablet 3  . aspirin 81 MG EC tablet Take 81 mg by mouth daily.      Marland Kitchen azithromycin (ZITHROMAX Z-PAK) 250 MG tablet Take 2 tablets (500 mg) on  Day 1,  followed by 1 tablet (250 mg) once daily on Days 2 through 5. 6 tablet 0  . Calcium Carb-Cholecalciferol (CALTRATE 600+D3 PO) Take 1 tablet by mouth daily.    . fish oil-omega-3 fatty acids 1000 MG capsule Take 1 g by mouth daily.      Marland Kitchen olmesartan (BENICAR) 40 MG tablet TAKE 1 TABLET DAILY (Patient not taking: Reported on 08/23/2019) 90 tablet 3  . pantoprazole (PROTONIX) 40 MG tablet Take 1 tablet (40 mg total) by mouth daily. (Patient taking differently: Take 40 mg by mouth 2 (two) times daily. ) 30 tablet 0  . TOPROL XL 100 MG 24 hr tablet TAKE 1 TABLET DAILY. TAKE WITH OR IMMEDIATELY FOLLOWING A MEAL 90 tablet 4   No facility-administered medications prior to visit.     Allergies  Allergen Reactions  . Synthroid [Levothyroxine] Other (See Comments)    Tongue swelling b/c of filler.    ROS Review of Systems    Objective:    Physical Exam  Constitutional: She is oriented to person, place, and time. She appears  well-developed and well-nourished.  HENT:  Head: Normocephalic and atraumatic.  Cardiovascular: Normal rate, regular rhythm and normal heart sounds.  Pulmonary/Chest: Effort normal and breath sounds normal.  Neurological: She is alert and oriented to person, place, and time.  Skin: Skin is warm and dry.  Psychiatric: She has a normal mood and affect. Her behavior is normal.    BP 126/64   Pulse 62   Ht 5\' 1"  (1.549 m)   Wt 190 lb (86.2 kg)   LMP  (LMP Unknown)   SpO2 100%   BMI 35.90 kg/m  Wt Readings from Last 3 Encounters:  08/23/19 190 lb (86.2 kg)  08/09/19 187 lb (84.8 kg)  08/03/19 204 lb (92.5 kg)     Health Maintenance Due  Topic Date Due  . FOOT EXAM  05/25/2019    There are no preventive care reminders to display for this patient.  Lab Results  Component Value Date   TSH 0.76 01/29/2019   Lab Results  Component Value Date   WBC 10.8 08/09/2019   HGB 9.8 (L) 08/09/2019   HCT 29.4 (L) 08/09/2019   MCV 91.0 08/09/2019   PLT 303 08/09/2019   Lab Results  Component Value Date   NA 140 08/09/2019   K 3.9 08/09/2019   CO2 27 08/09/2019   GLUCOSE 125 (H) 08/09/2019   BUN 18 08/09/2019   CREATININE 0.86 08/09/2019   BILITOT 0.3 08/09/2019   ALKPHOS 90 02/21/2017   AST 19 08/09/2019   ALT 25 08/09/2019   PROT 6.0 (L) 08/09/2019   ALBUMIN 4.1 02/21/2017   CALCIUM 9.6 08/09/2019   Lab Results  Component Value Date   CHOL 196 01/29/2019   Lab Results  Component Value Date   HDL 58 01/29/2019   Lab Results  Component Value Date   LDLCALC 110 (H) 01/29/2019   Lab Results  Component Value Date   TRIG 162 (H) 01/29/2019   Lab Results  Component Value Date   CHOLHDL 3.4 01/29/2019   Lab Results  Component Value Date   HGBA1C 6.3 (A) 06/04/2019      Assessment & Plan:   Problem List Items Addressed This Visit      Cardiovascular and Mediastinum   Essential hypertension - Primary    Blood pressure well controlled.  For now we will just  stick with the metoprolol she is doing great on that.  Continue to hold the Caduet.  We will continue to hold her R.  In the next couple weeks if she starts feeling better and doing more her blood pressure might go up so want her to continue to monitor her pressure and like to have her come back in about 3 to 4 weeks so that we can make any adjustments needed at that time.      Relevant Medications   metoprolol succinate (TOPROL-XL) 50 MG 24 hr tablet   atorvastatin (LIPITOR) 40 MG tablet     Digestive   Melena   Gastrointestinal hemorrhage associated with duodenal ulcer     Endocrine   Hyperlipidemia associated with type 2 diabetes mellitus ([place])    Plan to start atorvastatin since she cannot take the Caduet because her blood pressures have actually been really well regulated and in fact still a little bit low.  This may be secondary to the weight loss some medical head and start atorvastatin 40 mg that she was previously on 80.  If she tolerates it well without side effects then we can work on increasing it.      Relevant Medications   atorvastatin (LIPITOR) 40 MG tablet   Controlled diabetes  mellitus type 2 with complications [facility])    Discussed that we need to get her back on a statin.  We will start atorvastatin just an over 30-day supply for now since we are holding the Caduet but if her blood pressure starts to creep back up then we may end up restarting it.  Just encouraged her to hold onto it and not toss it.      Relevant Medications   atorvastatin (LIPITOR) 40 MG tablet     Other   Anemia    They did recheck her CBC earlier this week when she was seen by GI and her hemoglobin is now up to 10 which is great.  They have dropped her iron down to 1 tab daily.      Relevant Medications   ferrous sulfate 325 (65 FE) MG tablet     GI hemorrhage-continue to hold aspirin and fish oil products.  Avoid NSAIDs.  Okay to return to normal diet at this point.   Meds ordered this  encounter  Medications  . atorvastatin (LIPITOR) 40 MG tablet    Sig: Take 1 tablet (40 mg total) by mouth daily.    Dispense:  30 tablet    Refill:  0    Follow-up: Return in about 3 weeks (around 09/13/2019) for blood pressure check up .    Beatrice Lecher, MD

## 2019-09-13 DIAGNOSIS — E119 Type 2 diabetes mellitus without complications: Secondary | ICD-10-CM | POA: Diagnosis not present

## 2019-09-13 DIAGNOSIS — D3131 Benign neoplasm of right choroid: Secondary | ICD-10-CM | POA: Diagnosis not present

## 2019-09-13 DIAGNOSIS — D3132 Benign neoplasm of left choroid: Secondary | ICD-10-CM | POA: Diagnosis not present

## 2019-09-13 DIAGNOSIS — H524 Presbyopia: Secondary | ICD-10-CM | POA: Diagnosis not present

## 2019-09-13 LAB — HM DIABETES EYE EXAM

## 2019-09-14 ENCOUNTER — Other Ambulatory Visit: Payer: Self-pay

## 2019-09-14 ENCOUNTER — Ambulatory Visit (INDEPENDENT_AMBULATORY_CARE_PROVIDER_SITE_OTHER): Payer: Medicare Other | Admitting: Family Medicine

## 2019-09-14 VITALS — BP 151/58 | HR 74 | Wt 189.0 lb

## 2019-09-14 DIAGNOSIS — I10 Essential (primary) hypertension: Secondary | ICD-10-CM

## 2019-09-14 NOTE — Progress Notes (Signed)
Patient presents to office for blood pressure check. She is currently only taking Metoprolol Succinate 50 mg daily for blood pressure control.  Initial check today was 158/61. Patient sat and rested for 10 minutes and repeat check was 151/58.   Discussed with Dr Madilyn Fireman, patient to restart Benicar 40 mg. She has enough of this at home, I will add back to her medication list.   Health Maintenance: Abstracting A1C of 6.2 that was done on 08/11/19. Patient due for Urine Microalbumin and diabetic foot exam, she has diabetic foot exam scheduled for 10/08/19.

## 2019-09-14 NOTE — Progress Notes (Signed)
Agree with documentation as above.   Braden Deloach, MD  

## 2019-09-28 ENCOUNTER — Other Ambulatory Visit: Payer: Self-pay | Admitting: *Deleted

## 2019-09-28 MED ORDER — ATORVASTATIN CALCIUM 40 MG PO TABS: 40.0000 mg | ORAL_TABLET | Freq: Every day | ORAL | 3 refills | Status: DC

## 2019-10-08 ENCOUNTER — Other Ambulatory Visit: Payer: Self-pay

## 2019-10-08 ENCOUNTER — Ambulatory Visit (INDEPENDENT_AMBULATORY_CARE_PROVIDER_SITE_OTHER): Payer: Medicare Other | Admitting: Family Medicine

## 2019-10-08 ENCOUNTER — Encounter: Payer: Self-pay | Admitting: Family Medicine

## 2019-10-08 VITALS — BP 139/67 | HR 81 | Ht 61.0 in | Wt 190.0 lb

## 2019-10-08 DIAGNOSIS — E118 Type 2 diabetes mellitus with unspecified complications: Secondary | ICD-10-CM

## 2019-10-08 DIAGNOSIS — I10 Essential (primary) hypertension: Secondary | ICD-10-CM

## 2019-10-08 DIAGNOSIS — N1831 Chronic kidney disease, stage 3a: Secondary | ICD-10-CM

## 2019-10-08 DIAGNOSIS — E038 Other specified hypothyroidism: Secondary | ICD-10-CM

## 2019-10-08 LAB — POCT GLYCOSYLATED HEMOGLOBIN (HGB A1C): Hemoglobin A1C: 5.8 % — AB (ref 4.0–5.6)

## 2019-10-08 NOTE — Assessment & Plan Note (Signed)
Not well controlled.  Has been getting 0000000 systolic pressures at home.  We discussed increasing metoprolol.  She says she just filled a big 90-day supply so she would prefer to take 2 of what she has for couple weeks to see if it is helpful or not.  Let us know if it is not controlling her blood pressure under 140.

## 2019-10-08 NOTE — Assessment & Plan Note (Signed)
We will recheck TSH.  She has been working hard to lose weight but it really seems to have plateaued.

## 2019-10-08 NOTE — Patient Instructions (Addendum)
Please remember to get your tetanus shot done at the your local pharmacy.    DASH Eating Plan DASH stands for "Dietary Approaches to Stop Hypertension." The DASH eating plan is a healthy eating plan that has been shown to reduce high blood pressure (hypertension). It may also reduce your risk for type 2 diabetes, heart disease, and stroke. The DASH eating plan may also help with weight loss. What are tips for following this plan?  General guidelines  Avoid eating more than 2,300 mg (milligrams) of salt (sodium) a day. If you have hypertension, you may need to reduce your sodium intake to 1,500 mg a day.  Limit alcohol intake to no more than 1 drink a day for nonpregnant women and 2 drinks a day for men. One drink equals 12 oz of beer, 5 oz of wine, or 1 oz of hard liquor.  Work with your health care provider to maintain a healthy body weight or to lose weight. Ask what an ideal weight is for you.  Get at least 30 minutes of exercise that causes your heart to beat faster (aerobic exercise) most days of the week. Activities may include walking, swimming, or biking.  Work with your health care provider or diet and nutrition specialist (dietitian) to adjust your eating plan to your individual calorie needs. Reading food labels   Check food labels for the amount of sodium per serving. Choose foods with less than 5 percent of the Daily Value of sodium. Generally, foods with less than 300 mg of sodium per serving fit into this eating plan.  To find whole grains, look for the word "whole" as the first word in the ingredient list. Shopping  Buy products labeled as "low-sodium" or "no salt added."  Buy fresh foods. Avoid canned foods and premade or frozen meals. Cooking  Avoid adding salt when cooking. Use salt-free seasonings or herbs instead of table salt or sea salt. Check with your health care provider or pharmacist before using salt substitutes.  Do not fry foods. Cook foods using  healthy methods such as baking, boiling, grilling, and broiling instead.  Cook with heart-healthy oils, such as olive, canola, soybean, or sunflower oil. Meal planning  Eat a balanced diet that includes: ? 5 or more servings of fruits and vegetables each day. At each meal, try to fill half of your plate with fruits and vegetables. ? Up to 6-8 servings of whole grains each day. ? Less than 6 oz of lean meat, poultry, or fish each day. A 3-oz serving of meat is about the same size as a deck of cards. One egg equals 1 oz. ? 2 servings of low-fat dairy each day. ? A serving of nuts, seeds, or beans 5 times each week. ? Heart-healthy fats. Healthy fats called Omega-3 fatty acids are found in foods such as flaxseeds and coldwater fish, like sardines, salmon, and mackerel.  Limit how much you eat of the following: ? Canned or prepackaged foods. ? Food that is high in trans fat, such as fried foods. ? Food that is high in saturated fat, such as fatty meat. ? Sweets, desserts, sugary drinks, and other foods with added sugar. ? Full-fat dairy products.  Do not salt foods before eating.  Try to eat at least 2 vegetarian meals each week.  Eat more home-cooked food and less restaurant, buffet, and fast food.  When eating at a restaurant, ask that your food be prepared with less salt or no salt, if possible. What foods  are recommended? The items listed may not be a complete list. Talk with your dietitian about what dietary choices are best for you. Grains Whole-grain or whole-wheat bread. Whole-grain or whole-wheat pasta. Brown rice. [place]. Bulgur. Whole-grain and low-sodium cereals. Pita bread. Low-fat, low-sodium crackers. Whole-wheat flour tortillas. Vegetables Fresh or frozen vegetables (raw, steamed, roasted, or grilled). Low-sodium or reduced-sodium tomato and vegetable juice. Low-sodium or reduced-sodium tomato sauce and tomato paste. Low-sodium or reduced-sodium canned  vegetables. Fruits All fresh, dried, or frozen fruit. Canned fruit in natural juice (without added sugar). Meat and other protein foods Skinless chicken or [place]. Ground chicken or [place]. Pork with fat trimmed off. Fish and seafood. Egg whites. Dried beans, peas, or lentils. Unsalted nuts, nut butters, and seeds. Unsalted canned beans. Lean cuts of beef with fat trimmed off. Low-sodium, lean deli meat. Dairy Low-fat (1%) or fat-free (skim) milk. Fat-free, low-fat, or reduced-fat cheeses. Nonfat, low-sodium ricotta or cottage cheese. Low-fat or nonfat yogurt. Low-fat, low-sodium cheese. Fats and oils Soft margarine without trans fats. Vegetable oil. Low-fat, reduced-fat, or light mayonnaise and salad dressings (reduced-sodium). Canola, safflower, olive, soybean, and sunflower oils. Avocado. Seasoning and other foods Herbs. Spices. Seasoning mixes without salt. Unsalted popcorn and pretzels. Fat-free sweets. What foods are not recommended? The items listed may not be a complete list. Talk with your dietitian about what dietary choices are best for you. Grains Baked goods made with fat, such as croissants, muffins, or some breads. Dry pasta or rice meal packs. Vegetables Creamed or fried vegetables. Vegetables in a cheese sauce. Regular canned vegetables (not low-sodium or reduced-sodium). Regular canned tomato sauce and paste (not low-sodium or reduced-sodium). Regular tomato and vegetable juice (not low-sodium or reduced-sodium). Angie Fava. Olives. Fruits Canned fruit in a light or heavy syrup. Fried fruit. Fruit in cream or butter sauce. Meat and other protein foods Fatty cuts of meat. Ribs. Fried meat. Berniece Salines. Sausage. Bologna and other processed lunch meats. Salami. Fatback. Hotdogs. Bratwurst. Salted nuts and seeds. Canned beans with added salt. Canned or smoked fish. Whole eggs or egg yolks. Chicken or [place] with skin. Dairy Whole or 2% milk, cream, and half-and-half. Whole or full-fat  cream cheese. Whole-fat or sweetened yogurt. Full-fat cheese. Nondairy creamers. Whipped toppings. Processed cheese and cheese spreads. Fats and oils Butter. Stick margarine. Lard. Shortening. Ghee. Bacon fat. Tropical oils, such as coconut, palm kernel, or palm oil. Seasoning and other foods Salted popcorn and pretzels. Onion salt, garlic salt, seasoned salt, table salt, and sea salt. Worcestershire sauce. Tartar sauce. Barbecue sauce. Teriyaki sauce. Soy sauce, including reduced-sodium. Steak sauce. Canned and packaged gravies. Fish sauce. Oyster sauce. Cocktail sauce. Horseradish that you find on the shelf. Ketchup. Mustard. Meat flavorings and tenderizers. Bouillon cubes. Hot sauce and Tabasco sauce. Premade or packaged marinades. Premade or packaged taco seasonings. Relishes. Regular salad dressings. Where to find more information:  National Heart, Lung, and [place]: https://wilson-eaton.com/  American Heart Association: www.heart.org Summary  The DASH eating plan is a healthy eating plan that has been shown to reduce high blood pressure (hypertension). It may also reduce your risk for type 2 diabetes, heart disease, and stroke.  With the DASH eating plan, you should limit salt (sodium) intake to 2,300 mg a day. If you have hypertension, you may need to reduce your sodium intake to 1,500 mg a day.  When on the DASH eating plan, aim to eat more fresh fruits and vegetables, whole grains, lean proteins, low-fat dairy, and heart-healthy fats.  Work with your  health care provider or diet and nutrition specialist (dietitian) to adjust your eating plan to your individual calorie needs. This information is not intended to replace advice given to you by your health care provider. Make sure you discuss any questions you have with your health care provider. Document Released: 11/04/2011 Document Revised: 10/28/2017 Document Reviewed: 11/08/2016 Elsevier Patient Education  2020 [employer].

## 2019-10-08 NOTE — Assessment & Plan Note (Addendum)
A1C is normal.  Looks great! Lab Results  Component Value Date   HGBA1C 5.8 (A) 10/08/2019

## 2019-10-08 NOTE — Assessment & Plan Note (Signed)
To recheck renal function. 

## 2019-10-08 NOTE — Progress Notes (Signed)
Established Patient Office Visit  Subjective:  Patient ID: Emma Craig, female    DOB: January 23, 1951  Age: 68 y.o. MRN: [id number]  CC:  Chief Complaint  Patient presents with  . Diabetes  . Hypertension    HPI Emma Craig presents for   Hypertension- Pt denies chest pain, SOB, dizziness, or heart palpitations.  Taking meds as directed w/o problems.  Denies medication side effects.  At home reports that her systolic pressures have been running in the 140s.  Diabetes - no hypoglycemic events. No wounds or sores that are not healing well. No increased thirst or urination. Checking glucose at home. Taking medications as prescribed without any side effects.  Is really trying to get back on track with diet and losing weight she was doing well right before she ended up in the hospital and says she is just really struggling to get back on track.  Hypothyroidism - Taking medication regularly in the AM away from food and vitamins, etc. No recent change to skin, hair, or energy levels.  CKD 3-following renal function every 6 months.  Past Medical History:  Diagnosis Date  . Chronic kidney disease, stage 2, mildly decreased GFR   . Diabetes mellitus   . Heavy menses   . Hyperlipidemia   . Hypertension   . Obesity     Past Surgical History:  Procedure Laterality Date  . CATARACT EXTRACTION  1-09   right   . CATARACT EXTRACTION  09/2011   left   . CHOLECYSTECTOMY     laproscopic  . rt elbow screws placed  10-82    Family History  Problem Relation Age of Onset  . Cancer Father 35       colon and prostate CA  . Cancer Mother 1       carcinoid tumor/with mets to brain  . Hypertension Sister   . Hyperlipidemia Sister   . Hypertension Brother   . Hypertension Brother   . Hypertension Sister   . Hyperlipidemia Sister   . Hypertension Sister   . Hyperlipidemia Sister   . Hypertension Sister   . Hyperlipidemia Sister   . Lymphoma Sister        non-hodgkins     Social History    Socioeconomic History  . Marital status: Married    Spouse name: Not on file  . Number of children: Not on file  . Years of education: Not on file  . Highest education level: Not on file  Occupational History  . Not on file  Social Needs  . Financial resource strain: Not on file  . Food insecurity    Worry: Not on file    Inability: Not on file  . Transportation needs    Medical: Not on file    Non-medical: Not on file  Tobacco Use  . Smoking status: Never Smoker  . Smokeless tobacco: Never Used  Substance and Sexual Activity  . Alcohol use: Yes    Comment: rare  . Drug use: Not on file  . 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  Lifestyle  . Physical activity    Days per week: Not on file    Minutes per session: Not on file  . Stress: Not on file  Relationships  . Social Herbalist on phone: Not on file    Gets together: Not  on file    Attends religious service: Not on file    Active member of club or organization: Not on file    Attends meetings of clubs or organizations: Not on file    Relationship status: Not on file  . Intimate partner violence    Fear of current or ex partner: Not on file    Emotionally abused: Not on file    Physically abused: Not on file    Forced sexual activity: Not on file  Other Topics Concern  . Not on file  Social History Narrative   She cloggs for fun.      Outpatient Medications Prior to Visit  Medication Sig Dispense Refill  . atorvastatin (LIPITOR) 40 MG tablet Take 1 tablet (40 mg total) by mouth daily. 90 tablet 3  . Calcium-Magnesium-Vitamin D (CALCIUM 1200+D3 PO) Take 1 tablet by mouth daily.    . Cholecalciferol (VITAMIN D-3) 1000 UNITS CAPS Take 1 tablet by mouth daily.    . ferrous sulfate 325 (65 FE) MG tablet Take 325 mg by mouth 2 (two) times daily.    Marland Kitchen levothyroxine (SYNTHROID,  LEVOTHROID) 75 MCG tablet TAKE 1 TABLET DAILY BEFORE BREAKFAST 90 tablet 4  . metoprolol succinate (TOPROL-XL) 50 MG 24 hr tablet Take by mouth.    . olmesartan (BENICAR) 40 MG tablet TAKE 1 TABLET DAILY 90 tablet 3  . pantoprazole (PROTONIX) 40 MG tablet Take 40 mg by mouth 2 (two) times daily.      No facility-administered medications prior to visit.     No Known Allergies  ROS Review of Systems    Objective:    Physical Exam  Constitutional: She is oriented to person, place, and time. She appears well-developed and well-nourished.  HENT:  Head: Normocephalic and atraumatic.  Cardiovascular: Normal rate, regular rhythm and normal heart sounds.  Pulmonary/Chest: Effort normal and breath sounds normal.  Neurological: She is alert and oriented to person, place, and time.  Skin: Skin is warm and dry.  Psychiatric: She has a normal mood and affect. Her behavior is normal.    BP 139/67   Pulse 81   Ht 5\' 1"  (1.549 m)   Wt 190 lb (86.2 kg)   LMP  (LMP Unknown)   SpO2 95%   BMI 35.90 kg/m  Wt Readings from Last 3 Encounters:  10/08/19 190 lb (86.2 kg)  09/14/19 189 lb (85.7 kg)  08/23/19 190 lb (86.2 kg)     Health Maintenance Due  Topic Date Due  . OPHTHALMOLOGY EXAM  09/21/2019    There are no preventive care reminders to display for this patient.  Lab Results  Component Value Date   TSH 0.76 01/29/2019   Lab Results  Component Value Date   WBC 10.8 08/09/2019   HGB 9.8 (L) 08/09/2019   HCT 29.4 (L) 08/09/2019   MCV 91.0 08/09/2019   PLT 303 08/09/2019   Lab Results  Component Value Date   NA 140 08/09/2019   K 3.9 08/09/2019   CO2 27 08/09/2019   GLUCOSE 125 (H) 08/09/2019   BUN 18 08/09/2019   CREATININE 0.86 08/09/2019   BILITOT 0.3 08/09/2019   ALKPHOS 90 02/21/2017   AST 19 08/09/2019   ALT 25 08/09/2019   PROT 6.0 (L) 08/09/2019   ALBUMIN 4.1 02/21/2017   CALCIUM 9.6 08/09/2019   Lab Results  Component Value Date   CHOL 196 01/29/2019    Lab Results  Component Value Date   HDL 58 01/29/2019  Lab Results  Component Value Date   LDLCALC 110 (H) 01/29/2019   Lab Results  Component Value Date   TRIG 162 (H) 01/29/2019   Lab Results  Component Value Date   CHOLHDL 3.4 01/29/2019   Lab Results  Component Value Date   HGBA1C 5.8 (A) 10/08/2019      Assessment & Plan:   Problem List Items Addressed This Visit      Cardiovascular and Mediastinum   Essential hypertension    Not well controlled.  Has been getting 0000000 systolic pressures at home.  We discussed increasing metoprolol.  She says she just filled a big 90-day supply so she would prefer to take 2 of what she has for couple weeks to see if it is helpful or not.  Let us know if it is not controlling her blood pressure under 140.      Relevant Orders   BASIC METABOLIC PANEL WITH GFR     Endocrine   Hypothyroidism    We will recheck TSH.  She has been working hard to lose weight but it really seems to have plateaued.      Relevant Orders   TSH   Controlled diabetes mellitus type 2 with complications ([place]) - Primary    A1C is normal.  Looks great! Lab Results  Component Value Date   HGBA1C 5.8 (A) 10/08/2019         Relevant Orders   POCT HgB A1C (Completed)   BASIC METABOLIC PANEL WITH GFR     Genitourinary   CKD (chronic kidney disease) stage 3, GFR 30-59 ml/min    To recheck renal function.         Reminded her to get her tetanus vaccine at the local pharmacy.  She will information on DASH diet provided.  No orders of the defined types were placed in this encounter.   Follow-up: Return in about 4 months (around 02/05/2020) for Diabetes follow-up.    Beatrice Lecher, MD

## 2019-10-09 LAB — TSH: TSH: 0.92 mIU/L (ref 0.40–4.50)

## 2019-10-09 LAB — BASIC METABOLIC PANEL WITH GFR
BUN/Creatinine Ratio: 15 (calc) (ref 6–22)
BUN: 16 mg/dL (ref 7–25)
CO2: 23 mmol/L (ref 20–32)
Calcium: 9.3 mg/dL (ref 8.6–10.4)
Chloride: 104 mmol/L (ref 98–110)
Creat: 1.09 mg/dL — ABNORMAL HIGH (ref 0.50–0.99)
GFR, Est African American: 61 mL/min/{1.73_m2} (ref >=60)
GFR, Est Non African American: 53 mL/min/{1.73_m2} — ABNORMAL LOW (ref >=60)
Glucose, Bld: 111 mg/dL — ABNORMAL HIGH (ref 65–99)
Potassium: 4.9 mmol/L (ref 3.5–5.3)
Sodium: 141 mmol/L (ref 135–146)

## 2019-10-11 ENCOUNTER — Encounter: Payer: Self-pay | Admitting: Family Medicine

## 2019-10-22 ENCOUNTER — Telehealth: Payer: Self-pay | Admitting: Family Medicine

## 2019-10-22 NOTE — Telephone Encounter (Signed)
Patient called and wanted to give you an update on her blood pressure. She reports that her top numbers are running in the low 120s and the bottom are running in the low 70s.  10/20/2019: 120/73 10/21/2019: 122/60 10/22/2019:120/70.   She wanted to let you know the metoprolol is doing the job. FYI.

## 2019-10-22 NOTE — Telephone Encounter (Signed)
OK, they look great.

## 2019-11-13 DIAGNOSIS — Z800 Family history of malignant neoplasm of digestive organs: Secondary | ICD-10-CM | POA: Diagnosis not present

## 2019-11-13 DIAGNOSIS — K635 Polyp of colon: Secondary | ICD-10-CM | POA: Diagnosis not present

## 2019-11-13 LAB — HM COLONOSCOPY

## 2019-11-28 ENCOUNTER — Encounter: Payer: Self-pay | Admitting: Family Medicine

## 2019-12-31 ENCOUNTER — Other Ambulatory Visit: Payer: Self-pay | Admitting: Family Medicine

## 2019-12-31 NOTE — Telephone Encounter (Signed)
Please call patient and just verify.  I believe she doubled up on her metoprolol in the fall which would make a total of 100 mg.  We previously had 50 mg on file.  Versus actually taking 200 mg I just 1 to make sure I am sending the correct dose to her mail order.  Plus please make sure she her she has a follow-up later this month.

## 2019-12-31 NOTE — Telephone Encounter (Signed)
Patient called into the office needing medication refill for Toprol-XL 100mg , 2 tabs daily.  Medication was increased in November to 200mg  daily.  Needs new prescription sent to [facility].

## 2020-01-01 MED ORDER — METOPROLOL SUCCINATE ER 200 MG PO TB24: 200.0000 mg | ORAL_TABLET | Freq: Every day | ORAL | 1 refills | Status: DC

## 2020-01-01 NOTE — Telephone Encounter (Signed)
Emma Craig states she has been taking Metoprolol 100 mg 2 tablets once daily. She needs it to go to the local pharmacy/[facility].

## 2020-01-01 NOTE — Telephone Encounter (Signed)
LVM asking pt to rtn call regarding the Metoprolol xl.Emma Craig, Emma Craig

## 2020-01-29 DIAGNOSIS — L814 Other melanin hyperpigmentation: Secondary | ICD-10-CM | POA: Diagnosis not present

## 2020-01-29 DIAGNOSIS — L579 Skin changes due to chronic exposure to nonionizing radiation, unspecified: Secondary | ICD-10-CM | POA: Diagnosis not present

## 2020-01-29 DIAGNOSIS — L821 Other seborrheic keratosis: Secondary | ICD-10-CM | POA: Diagnosis not present

## 2020-01-29 DIAGNOSIS — L570 Actinic keratosis: Secondary | ICD-10-CM | POA: Diagnosis not present

## 2020-01-29 DIAGNOSIS — L853 Xerosis cutis: Secondary | ICD-10-CM | POA: Diagnosis not present

## 2020-02-04 ENCOUNTER — Encounter: Payer: Self-pay | Admitting: Family Medicine

## 2020-02-04 ENCOUNTER — Ambulatory Visit (INDEPENDENT_AMBULATORY_CARE_PROVIDER_SITE_OTHER): Payer: Medicare Other | Admitting: Family Medicine

## 2020-02-04 VITALS — BP 130/56 | HR 65 | Ht 61.0 in | Wt 198.0 lb

## 2020-02-04 DIAGNOSIS — I10 Essential (primary) hypertension: Secondary | ICD-10-CM

## 2020-02-04 DIAGNOSIS — N1831 Chronic kidney disease, stage 3a: Secondary | ICD-10-CM | POA: Diagnosis not present

## 2020-02-04 DIAGNOSIS — Z6837 Body mass index (BMI) 37.0-37.9, adult: Secondary | ICD-10-CM | POA: Diagnosis not present

## 2020-02-04 DIAGNOSIS — E118 Type 2 diabetes mellitus with unspecified complications: Secondary | ICD-10-CM

## 2020-02-04 LAB — COMPLETE METABOLIC PANEL WITH GFR
AG Ratio: 1.6 (calc) (ref 1.0–2.5)
ALT: 37 U/L — ABNORMAL HIGH (ref 6–29)
AST: 30 U/L (ref 10–35)
Albumin: 4.1 g/dL (ref 3.6–5.1)
Alkaline phosphatase (APISO): 98 U/L (ref 37–153)
BUN/Creatinine Ratio: 16 (calc) (ref 6–22)
BUN: 16 mg/dL (ref 7–25)
CO2: 31 mmol/L (ref 20–32)
Calcium: 9.4 mg/dL (ref 8.6–10.4)
Chloride: 103 mmol/L (ref 98–110)
Creat: 1.02 mg/dL — ABNORMAL HIGH (ref 0.50–0.99)
GFR, Est African American: 66 mL/min/{1.73_m2} (ref >=60)
GFR, Est Non African American: 57 mL/min/{1.73_m2} — ABNORMAL LOW (ref >=60)
Globulin: 2.5 g/dL (calc) (ref 1.9–3.7)
Glucose, Bld: 118 mg/dL — ABNORMAL HIGH (ref 65–99)
Potassium: 5.3 mmol/L (ref 3.5–5.3)
Sodium: 141 mmol/L (ref 135–146)
Total Bilirubin: 0.4 mg/dL (ref 0.2–1.2)
Total Protein: 6.6 g/dL (ref 6.1–8.1)

## 2020-02-04 LAB — LIPID PANEL
Cholesterol: 178 mg/dL (ref <200)
HDL: 46 mg/dL — ABNORMAL LOW (ref >=50)
LDL Cholesterol (Calc): 97 mg/dL (calc)
Non-HDL Cholesterol (Calc): 132 mg/dL (calc) — ABNORMAL HIGH (ref <130)
Total CHOL/HDL Ratio: 3.9 (calc) (ref <5.0)
Triglycerides: 234 mg/dL — ABNORMAL HIGH (ref <150)

## 2020-02-04 LAB — POCT GLYCOSYLATED HEMOGLOBIN (HGB A1C): Hemoglobin A1C: 6.6 % — AB (ref 4.0–5.6)

## 2020-02-04 NOTE — Assessment & Plan Note (Signed)
Due for labs. Check renal function Q6 mo

## 2020-02-04 NOTE — Progress Notes (Signed)
Established Patient Office Visit  Subjective:  Patient ID: Emma Craig, female    DOB: [birth year]  Age: 69 y.o. MRN: [id number]  CC:  Chief Complaint  Patient presents with  . Diabetes  . Hypertension    HPI Emma Craig presents for   Hypertension- Pt denies chest pain, SOB, dizziness, or heart palpitations.  Taking meds as directed w/o problems.  Denies medication side effects.   Diabetes - no hypoglycemic events. No wounds or sores that are not healing well. No increased thirst or urination. Checking glucose at home. Taking medications as prescribed without any side effects.  F/U CKD 3 - no recent changes. Due to check renal function every 6 months.    Started weight watches about 3 weeks ago. She is trying it to lost weight.    Past Medical History:  Diagnosis Date  . Chronic kidney disease, stage 2, mildly decreased GFR   . Diabetes mellitus   . Heavy menses   . Hyperlipidemia   . Hypertension   . Obesity     Past Surgical History:  Procedure Laterality Date  . CATARACT EXTRACTION  1-09   right   . CATARACT EXTRACTION  09/2011   left   . CHOLECYSTECTOMY     laproscopic  . rt elbow screws placed  10-82    Family History  Problem Relation Age of Onset  . Cancer Father 44       colon and prostate CA  . Cancer Mother 86       carcinoid tumor/with mets to brain  . Hypertension Sister   . Hyperlipidemia Sister   . Hypertension Brother   . Hypertension Brother   . Hypertension Sister   . Hyperlipidemia Sister   . Hypertension Sister   . Hyperlipidemia Sister   . Hypertension Sister   . Hyperlipidemia Sister   . Lymphoma Sister        non-hodgkins     Social History   Socioeconomic History  . Marital status: Married    Spouse name: Not on file  . Number of children: Not on file  . Years of education: Not on file  . Highest education level: Not on file  Occupational History  . Not on file  Tobacco Use  . Smoking status: Never Smoker  . Smokeless  tobacco: Never Used  Substance and Sexual Activity  . Alcohol use: Yes    Comment: rare  . Drug use: Not on file  . 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  Other Topics Concern  . Not on file  Social History Narrative   She cloggs for fun.     Social Determinants of Health   Financial Resource Strain:   . Difficulty of Paying Living Expenses: Not on file  Food Insecurity:   . Worried About Charity fundraiser in the Last Year: Not on file  . Ran Out of Food in the Last Year: Not on file  Transportation Needs:   . Lack of Transportation (Medical): Not on file  . Lack of Transportation (Non-Medical): Not on file  Physical Activity:   . Days of Exercise per Week: Not on file  . Minutes of Exercise per Session: Not on file  Stress:   . Feeling of Stress : Not on file  Social Connections:   . Frequency of Communication with Friends and Family:  Not on file  . Frequency of Social Gatherings with Friends and Family: Not on file  . Attends Religious Services: Not on file  . Active Member of Clubs or Organizations: Not on file  . Attends Archivist Meetings: Not on file  . Marital Status: Not on file  Intimate Partner Violence:   . Fear of Current or Ex-Partner: Not on file  . Emotionally Abused: Not on file  . Physically Abused: Not on file  . Sexually Abused: Not on file    Outpatient Medications Prior to Visit  Medication Sig Dispense Refill  . atorvastatin (LIPITOR) 40 MG tablet Take 1 tablet (40 mg total) by mouth daily. 90 tablet 3  . Calcium-Magnesium-Vitamin D (CALCIUM 1200+D3 PO) Take 1 tablet by mouth daily.    . Cholecalciferol (VITAMIN D-3) 1000 UNITS CAPS Take 1 tablet by mouth daily.    . ferrous sulfate 325 (65 FE) MG tablet Take 325 mg by mouth 2 (two) times daily.    Marland Kitchen levothyroxine (SYNTHROID, LEVOTHROID) 75 MCG tablet TAKE 1  TABLET DAILY BEFORE BREAKFAST 90 tablet 4  . metoprolol succinate (TOPROL-XL) 200 MG 24 hr tablet Take 1 tablet (200 mg total) by mouth daily. 90 tablet 1  . olmesartan (BENICAR) 40 MG tablet TAKE 1 TABLET DAILY 90 tablet 3  . pantoprazole (PROTONIX) 40 MG tablet Take 40 mg by mouth 2 (two) times daily.      No facility-administered medications prior to visit.    No Known Allergies  ROS Review of Systems    Objective:    Physical Exam  Constitutional: She is oriented to person, place, and time. She appears well-developed and well-nourished.  HENT:  Head: Normocephalic and atraumatic.  Cardiovascular: Normal rate, regular rhythm and normal heart sounds.  Pulmonary/Chest: Effort normal and breath sounds normal.  Neurological: She is alert and oriented to person, place, and time.  Skin: Skin is warm and dry.  Psychiatric: She has a normal mood and affect. Her behavior is normal.    BP (!) 130/56   Pulse 65   Ht 5\' 1"  (1.549 m)   Wt 198 lb (89.8 kg)   LMP  (LMP Unknown)   SpO2 99%   BMI 37.41 kg/m  Wt Readings from Last 3 Encounters:  02/04/20 198 lb (89.8 kg)  10/08/19 190 lb (86.2 kg)  09/14/19 189 lb (85.7 kg)     There are no preventive care reminders to display for this patient.  There are no preventive care reminders to display for this patient.  Lab Results  Component Value Date   TSH 0.92 10/08/2019   Lab Results  Component Value Date   WBC 10.8 08/09/2019   HGB 9.8 (L) 08/09/2019   HCT 29.4 (L) 08/09/2019   MCV 91.0 08/09/2019   PLT 303 08/09/2019   Lab Results  Component Value Date   NA 141 10/08/2019   K 4.9 10/08/2019   CO2 23 10/08/2019   GLUCOSE 111 (H) 10/08/2019   BUN 16 10/08/2019   CREATININE 1.09 (H) 10/08/2019   BILITOT 0.3 08/09/2019   ALKPHOS 90 02/21/2017   AST 19 08/09/2019   ALT 25 08/09/2019   PROT 6.0 (L) 08/09/2019   ALBUMIN 4.1 02/21/2017   CALCIUM 9.3 10/08/2019   Lab Results  Component Value Date   CHOL 196  01/29/2019   Lab Results  Component Value Date   HDL 58 01/29/2019   Lab Results  Component Value Date   LDLCALC 110 (H) 01/29/2019  Lab Results  Component Value Date   TRIG 162 (H) 01/29/2019   Lab Results  Component Value Date   CHOLHDL 3.4 01/29/2019   Lab Results  Component Value Date   HGBA1C 6.6 (A) 02/04/2020      Assessment & Plan:   Problem List Items Addressed This Visit      Cardiovascular and Mediastinum   Essential hypertension    Well controlled. Continue current regimen. Follow up in  6 mo      Relevant Orders   Lipid panel   COMPLETE METABOLIC PANEL WITH GFR     Endocrine   Controlled diabetes mellitus type 2 with complications (HCC) - Primary    A1c was up significantly this time compared to usual.        Relevant Orders   POCT glycosylated hemoglobin (Hb A1C) (Completed)   Lipid panel   COMPLETE METABOLIC PANEL WITH GFR     Genitourinary   CKD (chronic kidney disease) stage 3, GFR 30-59 ml/min    Due for labs. Check renal function Q6 mo      Relevant Orders   Lipid panel   COMPLETE METABOLIC PANEL WITH GFR    Other Visit Diagnoses    BMI 37.0-37.9, adult         I am happy to hear she is on weight watchers.    No orders of the defined types were placed in this encounter.   Follow-up: Return in about 4 months (around 06/05/2020) for Diabetes follow-up.    Beatrice Lecher, MD

## 2020-02-04 NOTE — Assessment & Plan Note (Signed)
A1c was up significantly this time compared to usual.

## 2020-02-04 NOTE — Assessment & Plan Note (Signed)
Well controlled. Continue current regimen. Follow up in  6 mo  

## 2020-02-11 DIAGNOSIS — Z23 Encounter for immunization: Secondary | ICD-10-CM | POA: Diagnosis not present

## 2020-04-14 ENCOUNTER — Other Ambulatory Visit: Payer: Self-pay | Admitting: Family Medicine

## 2020-05-13 ENCOUNTER — Other Ambulatory Visit: Payer: Self-pay | Admitting: Family Medicine

## 2020-06-09 ENCOUNTER — Encounter: Payer: Self-pay | Admitting: Family Medicine

## 2020-06-09 ENCOUNTER — Ambulatory Visit (INDEPENDENT_AMBULATORY_CARE_PROVIDER_SITE_OTHER): Payer: Medicare Other | Admitting: Family Medicine

## 2020-06-09 VITALS — BP 132/58 | HR 67 | Ht 61.0 in | Wt 201.0 lb

## 2020-06-09 DIAGNOSIS — K2100 Gastro-esophageal reflux disease with esophagitis, without bleeding: Secondary | ICD-10-CM

## 2020-06-09 DIAGNOSIS — K219 Gastro-esophageal reflux disease without esophagitis: Secondary | ICD-10-CM | POA: Insufficient documentation

## 2020-06-09 DIAGNOSIS — I10 Essential (primary) hypertension: Secondary | ICD-10-CM

## 2020-06-09 DIAGNOSIS — E118 Type 2 diabetes mellitus with unspecified complications: Secondary | ICD-10-CM

## 2020-06-09 LAB — POCT GLYCOSYLATED HEMOGLOBIN (HGB A1C): Hemoglobin A1C: 6.4 % — AB (ref 4.0–5.6)

## 2020-06-09 MED ORDER — METOPROLOL SUCCINATE ER 200 MG PO TB24: 200.0000 mg | ORAL_TABLET | Freq: Every day | ORAL | 1 refills | Status: DC

## 2020-06-09 MED ORDER — FERROUS SULFATE 325 (65 FE) MG PO TABS: 325.0000 mg | ORAL_TABLET | Freq: Every day | ORAL | 1 refills | Status: DC

## 2020-06-09 MED ORDER — PANTOPRAZOLE SODIUM 40 MG PO TBEC: 40.0000 mg | DELAYED_RELEASE_TABLET | Freq: Every day | ORAL | 3 refills | Status: DC

## 2020-06-09 NOTE — Assessment & Plan Note (Signed)
Well controlled. Continue current regimen. Follow up in  4 month.

## 2020-06-09 NOTE — Assessment & Plan Note (Signed)
Well controlled. Continue current regimen. Follow up in  4 months.   

## 2020-06-09 NOTE — Progress Notes (Signed)
Established Patient Office Visit  Subjective:  Patient ID: Emma Craig, female    DOB: 12-Jun-1951  Age: 69 y.o. MRN: [id number]  CC:  Chief Complaint  Patient presents with  . Diabetes  . Hypertension    HPI Lutisha Knoche presents for   Diabetes - no hypoglycemic events. No wounds or sores that are not healing well. No increased thirst or urination. Checking glucose at home. Taking medications as prescribed without any side effects.  Hypertension- Pt denies chest pain, SOB, dizziness, or heart palpitations.  Taking meds as directed w/o problems.  Denies medication side effects.    rGERD - requesting refills on her pantoprazole.  She was working on weight loss when I last saw sure she had joined weight watchers but since then has been traveling across country and admits she has been eating a lot she has been trying to be more careful about her choices but has not really been able to diet or exercise regularly.  She knows she needs to get back on track.  Past Medical History:  Diagnosis Date  . Chronic kidney disease, stage 2, mildly decreased GFR   . Diabetes mellitus   . Heavy menses   . Hyperlipidemia   . Hypertension   . Obesity     Past Surgical History:  Procedure Laterality Date  . CATARACT EXTRACTION  1-09   right   . CATARACT EXTRACTION  09/2011   left   . CHOLECYSTECTOMY     laproscopic  . rt elbow screws placed  10-82    Family History  Problem Relation Age of Onset  . Cancer Father 79       colon and prostate CA  . Cancer Mother 22       carcinoid tumor/with mets to brain  . Hypertension Sister   . Hyperlipidemia Sister   . Hypertension Brother   . Hypertension Brother   . Hypertension Sister   . Hyperlipidemia Sister   . Hypertension Sister   . Hyperlipidemia Sister   . Hypertension Sister   . Hyperlipidemia Sister   . Lymphoma Sister        non-hodgkins     Social History   Socioeconomic History  . Marital status: Married    Spouse name:  Not on file  . Number of children: Not on file  . Years of education: Not on file  . Highest education level: Not on file  Occupational History  . Not on file  Tobacco Use  . Smoking status: Never Smoker  . Smokeless tobacco: Never Used  Substance and Sexual Activity  . Alcohol use: Yes    Comment: rare  . Drug use: Not on file  . 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  Other Topics Concern  . Not on file  Social History Narrative   She cloggs for fun.     Social Determinants of Health   Financial Resource Strain:   . Difficulty of Paying Living Expenses:   Food Insecurity:   . Worried About Charity fundraiser in the Last Year:   . Arboriculturist in the Last Year:   Transportation Needs:   . Film/video editor (Medical):   Marland Kitchen Lack of Transportation (Non-Medical):   Physical Activity:   . Days of Exercise per Week:   . Minutes of Exercise per Session:  Stress:   . Feeling of Stress :   Social Connections:   . Frequency of Communication with Friends and Family:   . Frequency of Social Gatherings with Friends and Family:   . Attends Religious Services:   . Active Member of Clubs or Organizations:   . Attends Archivist Meetings:   Marland Kitchen Marital Status:   Intimate Partner Violence:   . Fear of Current or Ex-Partner:   . Emotionally Abused:   Marland Kitchen Physically Abused:   . Sexually Abused:     Outpatient Medications Prior to Visit  Medication Sig Dispense Refill  . atorvastatin (LIPITOR) 40 MG tablet Take 1 tablet (40 mg total) by mouth daily. 90 tablet 3  . Calcium-Magnesium-Vitamin D (CALCIUM 1200+D3 PO) Take 1 tablet by mouth daily.    . Cholecalciferol (VITAMIN D-3) 1000 UNITS CAPS Take 1 tablet by mouth daily.    Marland Kitchen olmesartan (BENICAR) 40 MG tablet TAKE 1 TABLET DAILY 90 tablet 3  . SYNTHROID 75 MCG tablet TAKE 1 TABLET DAILY BEFORE  BREAKFAST 90 tablet 3  . ferrous sulfate 325 (65 FE) MG tablet Take 325 mg by mouth 2 (two) times daily.    . metoprolol succinate (TOPROL-XL) 200 MG 24 hr tablet Take 1 tablet (200 mg total) by mouth daily. 90 tablet 1  . pantoprazole (PROTONIX) 40 MG tablet Take 40 mg by mouth 2 (two) times daily.      No facility-administered medications prior to visit.    No Known Allergies  ROS Review of Systems    Objective:    Physical Exam Constitutional:      Appearance: She is well-developed.  HENT:     Head: Normocephalic and atraumatic.     Nose: Nose normal.  Cardiovascular:     Rate and Rhythm: Normal rate and regular rhythm.     Heart sounds: Normal heart sounds.  Pulmonary:     Effort: Pulmonary effort is normal.     Breath sounds: Normal breath sounds.  Skin:    General: Skin is warm and dry.  Neurological:     Mental Status: She is alert and oriented to person, place, and time.  Psychiatric:        Behavior: Behavior normal.     BP (!) 132/58   Pulse 67   Ht 5\' 1"  (1.549 m)   Wt 201 lb (91.2 kg)   LMP  (LMP Unknown)   SpO2 99%   BMI 37.98 kg/m  Wt Readings from Last 3 Encounters:  06/09/20 201 lb (91.2 kg)  02/04/20 198 lb (89.8 kg)  10/08/19 190 lb (86.2 kg)     There are no preventive care reminders to display for this patient.  There are no preventive care reminders to display for this patient.  Lab Results  Component Value Date   TSH 0.92 10/08/2019   Lab Results  Component Value Date   WBC 10.8 08/09/2019   HGB 9.8 (L) 08/09/2019   HCT 29.4 (L) 08/09/2019   MCV 91.0 08/09/2019   PLT 303 08/09/2019   Lab Results  Component Value Date   NA 141 02/04/2020   K 5.3 02/04/2020   CO2 31 02/04/2020   GLUCOSE 118 (H) 02/04/2020   BUN 16 02/04/2020   CREATININE 1.02 (H) 02/04/2020   BILITOT 0.4 02/04/2020   ALKPHOS 90 02/21/2017   AST 30 02/04/2020   ALT 37 (H) 02/04/2020   PROT 6.6 02/04/2020   ALBUMIN 4.1 02/21/2017   CALCIUM 9.4  02/04/2020  Lab Results  Component Value Date   CHOL 178 02/04/2020   Lab Results  Component Value Date   HDL 46 (L) 02/04/2020   Lab Results  Component Value Date   LDLCALC 97 02/04/2020   Lab Results  Component Value Date   TRIG 234 (H) 02/04/2020   Lab Results  Component Value Date   CHOLHDL 3.9 02/04/2020   Lab Results  Component Value Date   HGBA1C 6.4 (A) 06/09/2020      Assessment & Plan:   Problem List Items Addressed This Visit      Cardiovascular and Mediastinum   Essential hypertension    Well controlled. Continue current regimen. Follow up in  4 month.       Relevant Medications   metoprolol (TOPROL-XL) 200 MG 24 hr tablet     Digestive   GERD (gastroesophageal reflux disease)   Relevant Medications   pantoprazole (PROTONIX) 40 MG tablet     Endocrine   Controlled diabetes mellitus type 2 with complications ([place]) - Primary    Well controlled. Continue current regimen. Follow up in  4 months.        Relevant Orders   POCT glycosylated hemoglobin (Hb A1C) (Completed)      Meds ordered this encounter  Medications  . pantoprazole (PROTONIX) 40 MG tablet    Sig: Take 1 tablet (40 mg total) by mouth daily.    Dispense:  90 tablet    Refill:  3  . ferrous sulfate 325 (65 FE) MG tablet    Sig: Take 1 tablet (325 mg total) by mouth daily.    Dispense:  90 tablet    Refill:  1  . metoprolol (TOPROL-XL) 200 MG 24 hr tablet    Sig: Take 1 tablet (200 mg total) by mouth daily.    Dispense:  90 tablet    Refill:  1    Follow-up: Return in about 4 months (around 10/10/2020) for Diabetes follow-up.    Beatrice Lecher, MD

## 2020-07-02 ENCOUNTER — Other Ambulatory Visit: Payer: Self-pay | Admitting: *Deleted

## 2020-07-02 MED ORDER — METOPROLOL SUCCINATE ER 200 MG PO TB24: 200.0000 mg | ORAL_TABLET | Freq: Every day | ORAL | 1 refills | Status: DC

## 2020-08-14 IMAGING — CT CT ABD-PELV W/ CM
2 of 5 series · 16 of 46 positions shown, 18 images · IV contrast (APPLIED)
Comparison: None.

CLINICAL DATA: Epigastric and left-sided abdominal pain, melena,
anemia, constipation

EXAM:
CT ABDOMEN AND PELVIS WITH CONTRAST
TECHNIQUE: Multidetector CT imaging of the abdomen and pelvis was performed
using the standard protocol following bolus administration of
intravenous contrast.
CONTRAST:  100mL OMNIPAQUE IOHEXOL 300 MG/ML SOLN, additional oral
enteric contrast

[Series 2: axial st · axial · 0.87mm/px · z∈[-544,-130]mm · 13 of 93 slices shown, 15 images]
[im 5/93  soft-tissue]
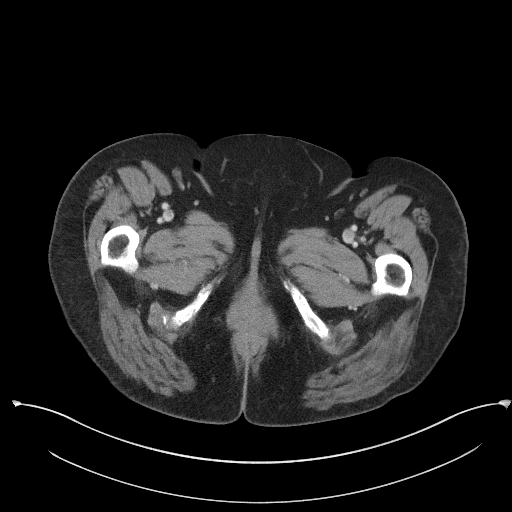
[im 5/93  bone]
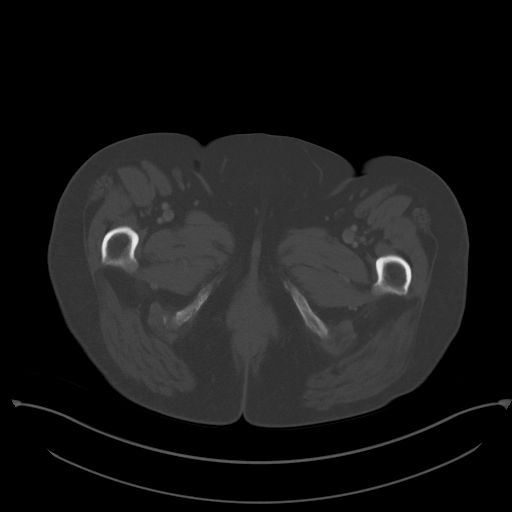
[im 15/93  soft-tissue]
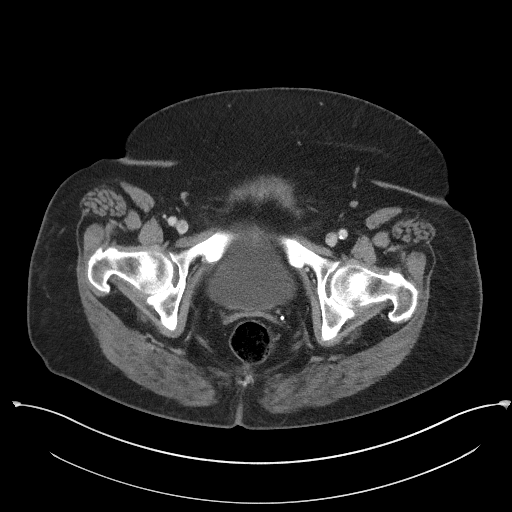
[im 20/93  soft-tissue]
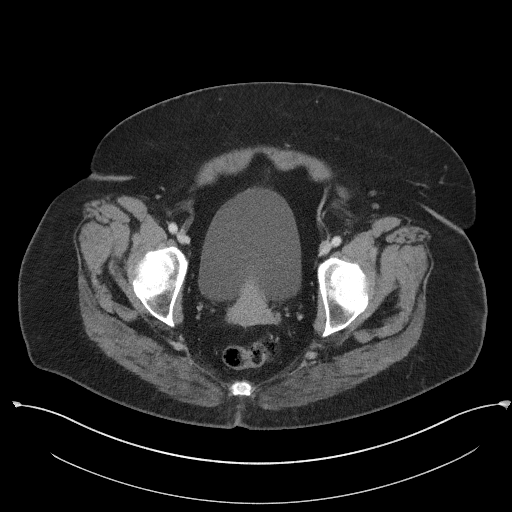
[im 25/93  soft-tissue]
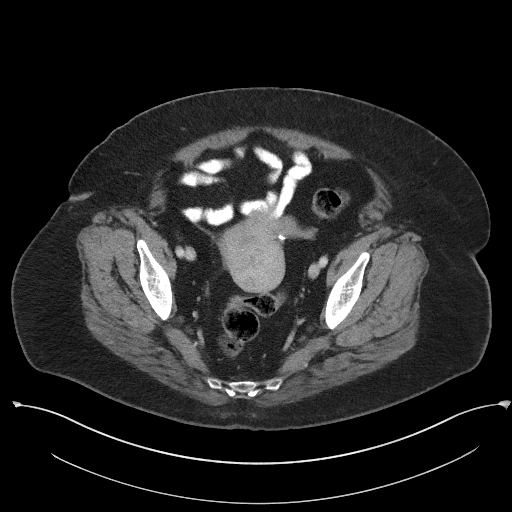
[im 34/93  soft-tissue]
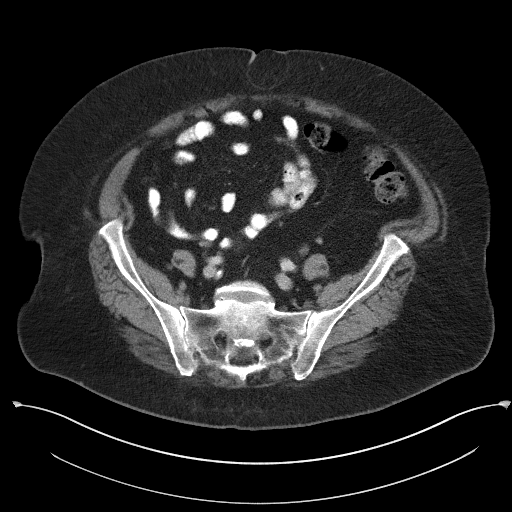
[im 39/93  soft-tissue]
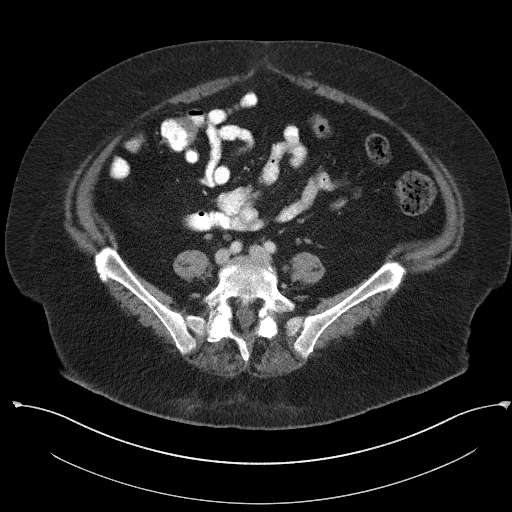
[im 49/93  soft-tissue]
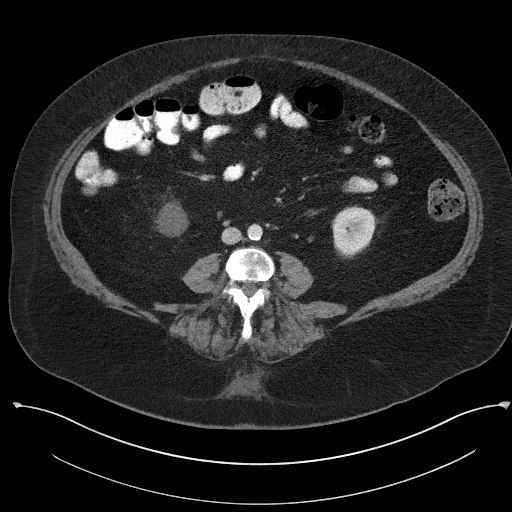
[im 54/93  soft-tissue]
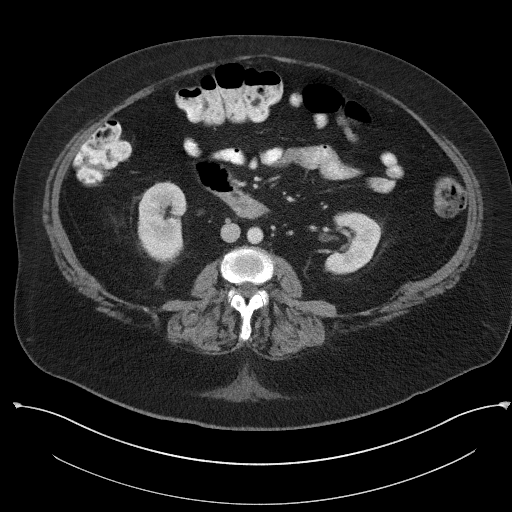
[im 59/93  soft-tissue]
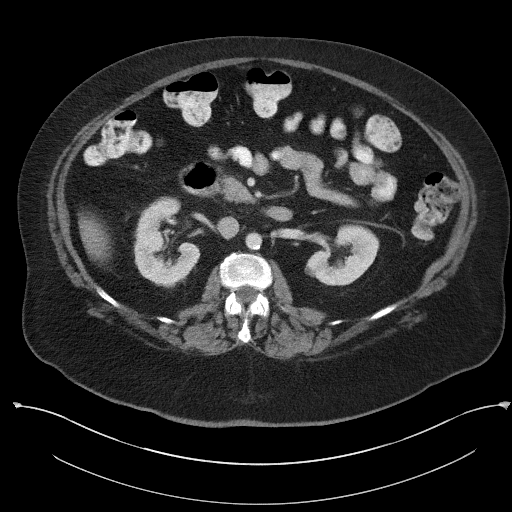
[im 59/93  bone]
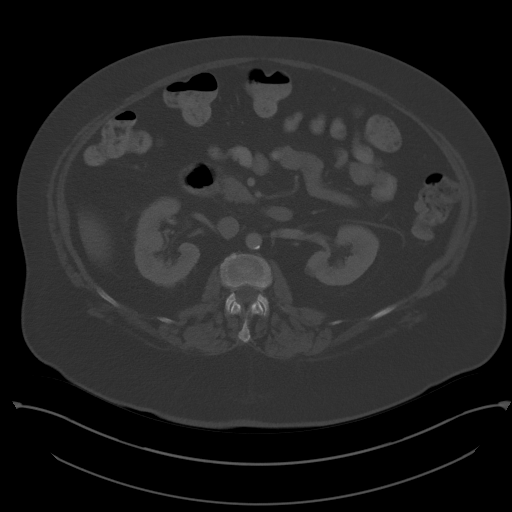
[im 68/93  soft-tissue]
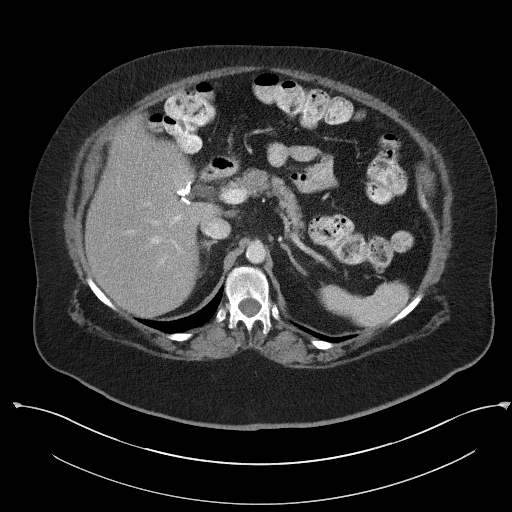
[im 73/93  soft-tissue]
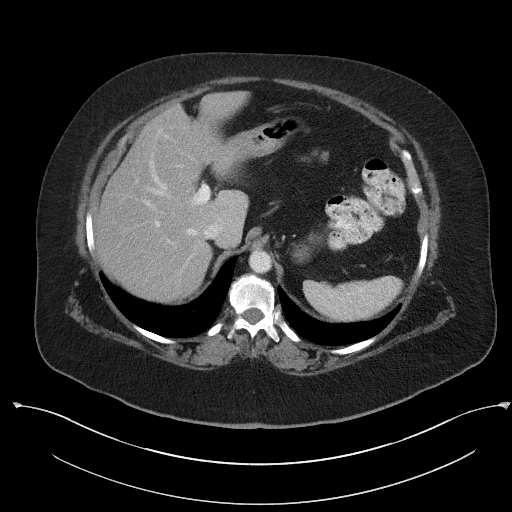
[im 78/93  soft-tissue]
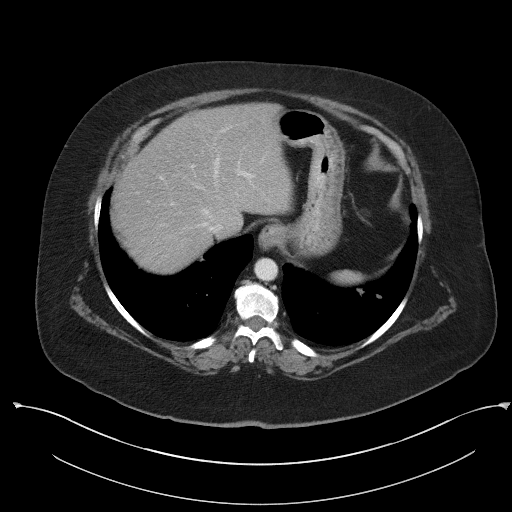
[im 88/93  soft-tissue]
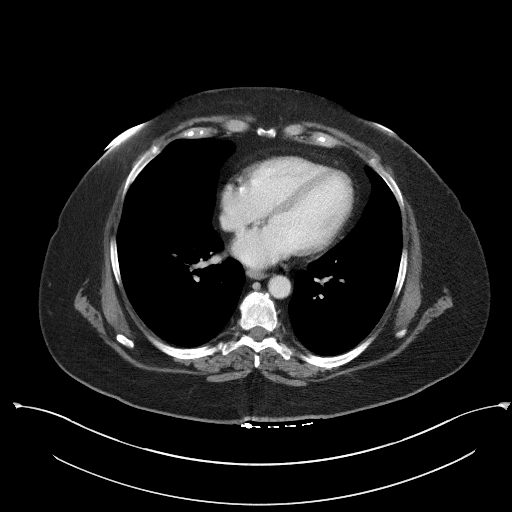

[Series 5: coronal st · coronal · 0.91mm/px · 3 of 115 slices shown]
[im 39/115  soft-tissue]
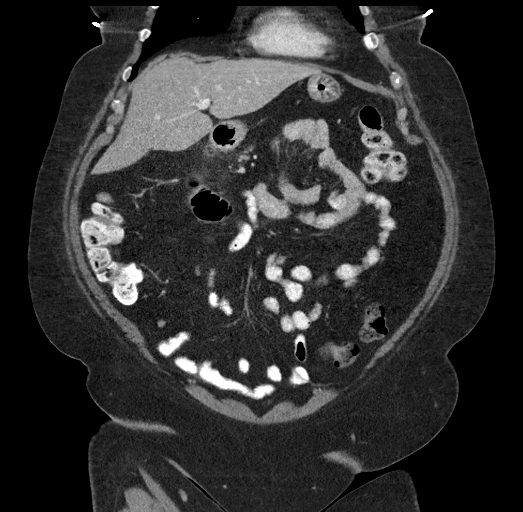
[im 51/115  soft-tissue]
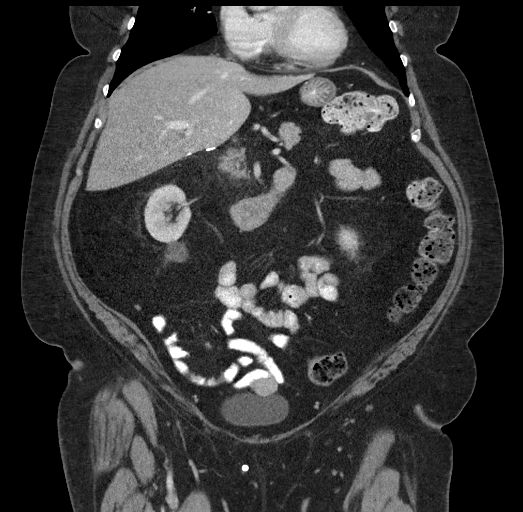
[im 64/115  soft-tissue]
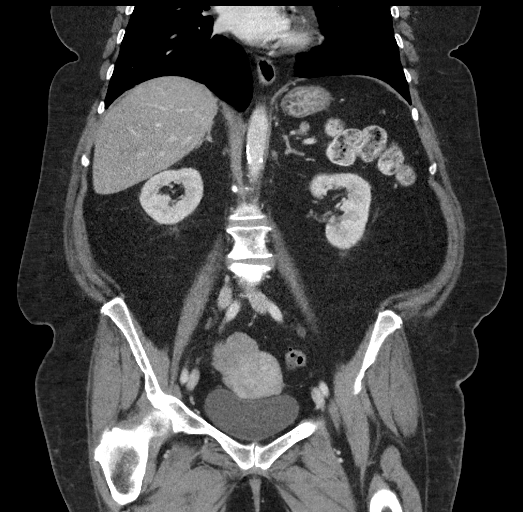

[16 of 46 positions shown; findings below may reference images not displayed]

FINDINGS: Lower chest: Clustered irregular airspace opacities in the dependent
bilateral lung bases (series 4, image 15, 18).

Hepatobiliary: No focal liver abnormality is seen. Status post
cholecystectomy. No biliary dilatation.

Pancreas: Unremarkable. No pancreatic ductal dilatation or
surrounding inflammatory changes.

Spleen: Normal in size without significant abnormality.

Adrenals/Urinary Tract: Adrenal glands are unremarkable. Kidneys are
normal, without renal calculi, solid lesion, or hydronephrosis.
Bladder is unremarkable.

Stomach/Bowel: Stomach is within normal limits. Appendix appears
normal. No evidence of bowel wall thickening, distention, or
inflammatory changes. Moderate burden of stool in the colon.

Vascular/Lymphatic: Aortic atherosclerosis. No enlarged abdominal or
pelvic lymph nodes.

Reproductive: Exophytic or pedunculated fibroids of the uterine
fundus.

Other: Small fat containing umbilical hernia. No abdominopelvic
ascites.

Musculoskeletal: No acute or significant osseous findings.
IMPRESSION: 1. No acute CT findings of the abdomen or pelvis to explain
abdominal pain, melena, anemia, or constipation. No significant
diverticular disease. Moderate burden of stool in colon.

2. Small, fat containing umbilical hernia, which may explain
epigastric abdominal pain. Correlate for acute tenderness.

3. Clustered irregular airspace opacities in the dependent bilateral
lung bases (series 4, image 15, 18), nonspecific and likely related
to infection or aspiration. Recommend follow-up CT in 3 months to
assess for stability or resolution.

## 2020-08-21 ENCOUNTER — Other Ambulatory Visit: Payer: Self-pay | Admitting: Family Medicine

## 2020-08-21 DIAGNOSIS — Z1231 Encounter for screening mammogram for malignant neoplasm of breast: Secondary | ICD-10-CM

## 2020-09-09 LAB — HM DIABETES EYE EXAM

## 2020-09-18 ENCOUNTER — Other Ambulatory Visit: Payer: Self-pay

## 2020-09-18 ENCOUNTER — Ambulatory Visit (INDEPENDENT_AMBULATORY_CARE_PROVIDER_SITE_OTHER): Payer: Medicare Other

## 2020-09-18 DIAGNOSIS — Z1231 Encounter for screening mammogram for malignant neoplasm of breast: Secondary | ICD-10-CM | POA: Diagnosis not present

## 2020-09-22 ENCOUNTER — Other Ambulatory Visit: Payer: Self-pay | Admitting: Family Medicine

## 2020-10-02 ENCOUNTER — Telehealth: Payer: Self-pay

## 2020-10-02 DIAGNOSIS — E1169 Type 2 diabetes mellitus with other specified complication: Secondary | ICD-10-CM

## 2020-10-02 DIAGNOSIS — E118 Type 2 diabetes mellitus with unspecified complications: Secondary | ICD-10-CM

## 2020-10-02 DIAGNOSIS — E785 Hyperlipidemia, unspecified: Secondary | ICD-10-CM

## 2020-10-02 DIAGNOSIS — I10 Essential (primary) hypertension: Secondary | ICD-10-CM

## 2020-10-02 NOTE — Telephone Encounter (Signed)
Upcoming diabetes follow up and needs labs repeated.   A1C, CMP, and Lipid panel ordered, patient advised to have these done fasting prior to appt

## 2020-10-06 DIAGNOSIS — E118 Type 2 diabetes mellitus with unspecified complications: Secondary | ICD-10-CM | POA: Diagnosis not present

## 2020-10-06 DIAGNOSIS — E785 Hyperlipidemia, unspecified: Secondary | ICD-10-CM | POA: Diagnosis not present

## 2020-10-06 DIAGNOSIS — E1169 Type 2 diabetes mellitus with other specified complication: Secondary | ICD-10-CM | POA: Diagnosis not present

## 2020-10-06 DIAGNOSIS — I10 Essential (primary) hypertension: Secondary | ICD-10-CM | POA: Diagnosis not present

## 2020-10-13 ENCOUNTER — Encounter: Payer: Self-pay | Admitting: Family Medicine

## 2020-10-13 ENCOUNTER — Ambulatory Visit (INDEPENDENT_AMBULATORY_CARE_PROVIDER_SITE_OTHER): Payer: Medicare Other | Admitting: Family Medicine

## 2020-10-13 ENCOUNTER — Other Ambulatory Visit: Payer: Self-pay

## 2020-10-13 VITALS — BP 132/82 | HR 67 | Ht 61.0 in | Wt 204.0 lb

## 2020-10-13 DIAGNOSIS — K760 Fatty (change of) liver, not elsewhere classified: Secondary | ICD-10-CM | POA: Diagnosis not present

## 2020-10-13 DIAGNOSIS — I10 Essential (primary) hypertension: Secondary | ICD-10-CM | POA: Diagnosis not present

## 2020-10-13 DIAGNOSIS — R748 Abnormal levels of other serum enzymes: Secondary | ICD-10-CM | POA: Diagnosis not present

## 2020-10-13 DIAGNOSIS — N1831 Chronic kidney disease, stage 3a: Secondary | ICD-10-CM

## 2020-10-13 DIAGNOSIS — E118 Type 2 diabetes mellitus with unspecified complications: Secondary | ICD-10-CM

## 2020-10-13 DIAGNOSIS — R21 Rash and other nonspecific skin eruption: Secondary | ICD-10-CM

## 2020-10-13 MED ORDER — METOPROLOL SUCCINATE ER 200 MG PO TB24
200.0000 mg | ORAL_TABLET | Freq: Every day | ORAL | 1 refills | Status: DC
Start: 2020-10-13 — End: 2021-05-25

## 2020-10-13 NOTE — Assessment & Plan Note (Signed)
Well controlled. Continue current regimen. Follow up in  4 mo 

## 2020-10-13 NOTE — Progress Notes (Signed)
Established Patient Office Visit  Subjective:  Patient ID: Emma Craig, female    DOB: Apr 08, 1951  Age: 69 y.o. MRN: [id number]  CC:  Chief Complaint  Patient presents with  . Diabetes  . Hypertension    HPI Emma Craig presents for 4 mo f/u  Diabetes - no hypoglycemic events. No wounds or sores that are not healing well. No increased thirst or urination. Checking glucose at home. Taking medications as prescribed without any side effects.  Elevated liver enzymes on recent labs.    She is also been treating her ringworm on her right anterior forearm with a topical antifungal cream she has been using it for about a month and says it definitely looks much better it is now flat but it still looks a little pink.  Past Medical History:  Diagnosis Date  . Chronic kidney disease, stage 2, mildly decreased GFR   . Diabetes mellitus   . Heavy menses   . Hyperlipidemia   . Hypertension   . Obesity     Past Surgical History:  Procedure Laterality Date  . CATARACT EXTRACTION  1-09   right   . CATARACT EXTRACTION  09/2011   left   . CHOLECYSTECTOMY     laproscopic  . rt elbow screws placed  10-82    Family History  Problem Relation Age of Onset  . Cancer Father 60       colon and prostate CA  . Cancer Mother 91       carcinoid tumor/with mets to brain  . Hypertension Sister   . Hyperlipidemia Sister   . Hypertension Brother   . Hypertension Brother   . Hypertension Sister   . Hyperlipidemia Sister   . Hypertension Sister   . Hyperlipidemia Sister   . Hypertension Sister   . Hyperlipidemia Sister   . Lymphoma Sister        non-hodgkins     Social History   Socioeconomic History  . Marital status: Married    Spouse name: Not on file  . Number of children: Not on file  . Years of education: Not on file  . Highest education level: Not on file  Occupational History  . Not on file  Tobacco Use  . Smoking status: Never Smoker  . Smokeless tobacco: Never Used   Substance and Sexual Activity  . Alcohol use: Yes    Comment: rare  . Drug use: Not on file  . 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  Other Topics Concern  . Not on file  Social History Narrative   She cloggs for fun.     Social Determinants of Health   Financial Resource Strain:   . Difficulty of Paying Living Expenses: Not on file  Food Insecurity:   . Worried About Charity fundraiser in the Last Year: Not on file  . Ran Out of Food in the Last Year: Not on file  Transportation Needs:   . Lack of Transportation (Medical): Not on file  . Lack of Transportation (Non-Medical): Not on file  Physical Activity:   . Days of Exercise per Week: Not on file  . Minutes of Exercise per Session: Not on file  Stress:   . Feeling of Stress : Not on file  Social Connections:   . Frequency of Communication with Friends and Family: Not on file  .  Frequency of Social Gatherings with Friends and Family: Not on file  . Attends Religious Services: Not on file  . Active Member of Clubs or Organizations: Not on file  . Attends Archivist Meetings: Not on file  . Marital Status: Not on file  Intimate Partner Violence:   . Fear of Current or Ex-Partner: Not on file  . Emotionally Abused: Not on file  . Physically Abused: Not on file  . Sexually Abused: Not on file    Outpatient Medications Prior to Visit  Medication Sig Dispense Refill  . atorvastatin (LIPITOR) 40 MG tablet TAKE 1 TABLET(40 MG) BY MOUTH DAILY 90 tablet 3  . Calcium-Magnesium-Vitamin D (CALCIUM 1200+D3 PO) Take 1 tablet by mouth daily.    . Cholecalciferol (VITAMIN D-3) 1000 UNITS CAPS Take 1 tablet by mouth daily.    . ferrous sulfate 325 (65 FE) MG tablet Take 1 tablet (325 mg total) by mouth daily. 90 tablet 1  . olmesartan (BENICAR) 40 MG tablet TAKE 1 TABLET DAILY 90 tablet 3  .  pantoprazole (PROTONIX) 40 MG tablet Take 1 tablet (40 mg total) by mouth daily. 90 tablet 3  . SYNTHROID 75 MCG tablet TAKE 1 TABLET DAILY BEFORE BREAKFAST 90 tablet 3  . metoprolol (TOPROL-XL) 200 MG 24 hr tablet Take 1 tablet (200 mg total) by mouth daily. 90 tablet 1   No facility-administered medications prior to visit.    No Known Allergies  ROS Review of Systems    Objective:    Physical Exam Constitutional:      Appearance: She is well-developed.  HENT:     Head: Normocephalic and atraumatic.  Cardiovascular:     Rate and Rhythm: Normal rate and regular rhythm.     Heart sounds: Normal heart sounds.  Pulmonary:     Effort: Pulmonary effort is normal.     Breath sounds: Normal breath sounds.  Skin:    General: Skin is warm and dry.     Comments: Does have an erythematous ring on her right anterior forearm.  It is macular and no scale.  Neurological:     Mental Status: She is alert and oriented to person, place, and time.  Psychiatric:        Behavior: Behavior normal.     BP 132/82   Pulse 67   Ht 5\' 1"  (1.549 m)   Wt 204 lb (92.5 kg)   LMP  (LMP Unknown)   SpO2 98%   BMI 38.55 kg/m  Wt Readings from Last 3 Encounters:  10/13/20 204 lb (92.5 kg)  06/09/20 201 lb (91.2 kg)  02/04/20 198 lb (89.8 kg)     Health Maintenance Due  Topic Date Due  . DEXA SCAN  08/09/2020    There are no preventive care reminders to display for this patient.  Lab Results  Component Value Date   TSH 0.92 10/08/2019   Lab Results  Component Value Date   WBC 10.8 08/09/2019   HGB 9.8 (L) 08/09/2019   HCT 29.4 (L) 08/09/2019   MCV 91.0 08/09/2019   PLT 303 08/09/2019   Lab Results  Component Value Date   NA 139 10/06/2020   K 4.5 10/06/2020   CO2 29 10/06/2020   GLUCOSE 112 (H) 10/06/2020   BUN 20 10/06/2020   CREATININE 1.05 (H) 10/06/2020   BILITOT 0.4 10/06/2020   ALKPHOS 90 02/21/2017   AST 46 (H) 10/06/2020   ALT 56 (H) 10/06/2020   PROT 7.1  10/06/2020  ALBUMIN 4.1 02/21/2017   CALCIUM 10.0 10/06/2020   Lab Results  Component Value Date   CHOL 187 10/06/2020   Lab Results  Component Value Date   HDL 47 (L) 10/06/2020   Lab Results  Component Value Date   LDLCALC 106 (H) 10/06/2020   Lab Results  Component Value Date   TRIG 223 (H) 10/06/2020   Lab Results  Component Value Date   CHOLHDL 4.0 10/06/2020   Lab Results  Component Value Date   HGBA1C 6.6 (H) 10/06/2020      Assessment & Plan:   Problem List Items Addressed This Visit      Cardiovascular and Mediastinum   Essential hypertension - Primary    Well controlled. Continue current regimen. Follow up in  4 mo      Relevant Medications   metoprolol (TOPROL-XL) 200 MG 24 hr tablet     Digestive   Fatty liver    Following liver enzymes which is are up from previousl.  Will call with additional lab results.  F/U in 4-6 weeks to recheck liver enzymes.          Endocrine   Controlled diabetes mellitus type 2 with complications ([place])    Well controlled. A1C of 6.6, though up from previous so really encouraged her to work on her diet.  .  Continue current regimen. Follow up in  4 mo        Genitourinary   CKD (chronic kidney disease) stage 3, GFR 30-59 ml/min (HCC)    Following Q 6 mo        Other   Elevated liver enzymes    Other Visit Diagnoses    Rash         Rash-most consistent with ringworm-continue to treat with antifungal cream for about 2 more weeks.  Meds ordered this encounter  Medications  . metoprolol (TOPROL-XL) 200 MG 24 hr tablet    Sig: Take 1 tablet (200 mg total) by mouth daily.    Dispense:  90 tablet    Refill:  1    Follow-up: Return in about 4 months (around 02/10/2021) for Diabetes follow-up, Hypertension.    Beatrice Lecher, MD

## 2020-10-13 NOTE — Assessment & Plan Note (Addendum)
Well controlled. A1C of 6.6, though up from previous so really encouraged her to work on her diet.  .  Continue current regimen. Follow up in  4 mo

## 2020-10-13 NOTE — Assessment & Plan Note (Signed)
Following Q 6 mo  

## 2020-10-13 NOTE — Assessment & Plan Note (Signed)
Following liver enzymes which is are up from previousl.  Will call with additional lab results.  F/U in 4-6 weeks to recheck liver enzymes.

## 2020-10-21 LAB — ALPHA-1 ANTITRYPSIN MUT ANALYSIS

## 2020-10-21 LAB — COMPLETE METABOLIC PANEL WITH GFR
AG Ratio: 1.6 (calc) (ref 1.0–2.5)
ALT: 56 U/L — ABNORMAL HIGH (ref 6–29)
AST: 46 U/L — ABNORMAL HIGH (ref 10–35)
Albumin: 4.4 g/dL (ref 3.6–5.1)
Alkaline phosphatase (APISO): 91 U/L (ref 37–153)
BUN/Creatinine Ratio: 19 (calc) (ref 6–22)
BUN: 20 mg/dL (ref 7–25)
CO2: 29 mmol/L (ref 20–32)
Calcium: 10 mg/dL (ref 8.6–10.4)
Chloride: 100 mmol/L (ref 98–110)
Creat: 1.05 mg/dL — ABNORMAL HIGH (ref 0.50–0.99)
GFR, Est African American: 64 mL/min/{1.73_m2} (ref >=60)
GFR, Est Non African American: 55 mL/min/{1.73_m2} — ABNORMAL LOW (ref >=60)
Globulin: 2.7 g/dL (calc) (ref 1.9–3.7)
Glucose, Bld: 112 mg/dL — ABNORMAL HIGH (ref 65–99)
Potassium: 4.5 mmol/L (ref 3.5–5.3)
Sodium: 139 mmol/L (ref 135–146)
Total Bilirubin: 0.4 mg/dL (ref 0.2–1.2)
Total Protein: 7.1 g/dL (ref 6.1–8.1)

## 2020-10-21 LAB — ACUTE HEP PANEL AND HEP B SURFACE AB
HEPATITIS C ANTIBODY REFILL$(REFL): NONREACTIVE
Hep A IgM: NONREACTIVE
Hep B C IgM: NONREACTIVE
Hepatitis B Surface Ag: NONREACTIVE
SIGNAL TO CUT-OFF: 0.01 (ref <1.00)

## 2020-10-21 LAB — LIPID PANEL W/REFLEX DIRECT LDL
Cholesterol: 187 mg/dL (ref <200)
HDL: 47 mg/dL — ABNORMAL LOW (ref >=50)
LDL Cholesterol (Calc): 106 mg/dL (calc) — ABNORMAL HIGH
Non-HDL Cholesterol (Calc): 140 mg/dL (calc) — ABNORMAL HIGH (ref <130)
Total CHOL/HDL Ratio: 4 (calc) (ref <5.0)
Triglycerides: 223 mg/dL — ABNORMAL HIGH (ref <150)

## 2020-10-21 LAB — REFLEX TIQ

## 2020-10-21 LAB — HEMOGLOBIN A1C
Hgb A1c MFr Bld: 6.6 % of total Hgb — ABNORMAL HIGH (ref <5.7)
Mean Plasma Glucose: 143 (calc)
eAG (mmol/L): 7.9 (calc)

## 2020-10-22 ENCOUNTER — Other Ambulatory Visit: Payer: Self-pay | Admitting: *Deleted

## 2020-10-22 DIAGNOSIS — R7989 Other specified abnormal findings of blood chemistry: Secondary | ICD-10-CM

## 2020-10-28 DIAGNOSIS — L820 Inflamed seborrheic keratosis: Secondary | ICD-10-CM | POA: Diagnosis not present

## 2020-11-11 DIAGNOSIS — R7989 Other specified abnormal findings of blood chemistry: Secondary | ICD-10-CM | POA: Diagnosis not present

## 2020-11-12 LAB — COMPLETE METABOLIC PANEL WITH GFR
AG Ratio: 1.8 (calc) (ref 1.0–2.5)
ALT: 51 U/L — ABNORMAL HIGH (ref 6–29)
AST: 35 U/L (ref 10–35)
Albumin: 4.2 g/dL (ref 3.6–5.1)
Alkaline phosphatase (APISO): 79 U/L (ref 37–153)
BUN: 21 mg/dL (ref 7–25)
CO2: 29 mmol/L (ref 20–32)
Calcium: 9.2 mg/dL (ref 8.6–10.4)
Chloride: 101 mmol/L (ref 98–110)
Creat: 0.92 mg/dL (ref 0.50–0.99)
GFR, Est African American: 75 mL/min/{1.73_m2} (ref >=60)
GFR, Est Non African American: 64 mL/min/{1.73_m2} (ref >=60)
Globulin: 2.3 g/dL (calc) (ref 1.9–3.7)
Glucose, Bld: 107 mg/dL — ABNORMAL HIGH (ref 65–99)
Potassium: 5.2 mmol/L (ref 3.5–5.3)
Sodium: 138 mmol/L (ref 135–146)
Total Bilirubin: 0.3 mg/dL (ref 0.2–1.2)
Total Protein: 6.5 g/dL (ref 6.1–8.1)

## 2020-12-02 ENCOUNTER — Other Ambulatory Visit: Payer: Self-pay | Admitting: Medical-Surgical

## 2020-12-02 ENCOUNTER — Encounter: Payer: Self-pay | Admitting: Medical-Surgical

## 2020-12-02 ENCOUNTER — Ambulatory Visit (INDEPENDENT_AMBULATORY_CARE_PROVIDER_SITE_OTHER): Payer: Medicare Other

## 2020-12-02 ENCOUNTER — Other Ambulatory Visit: Payer: Self-pay

## 2020-12-02 ENCOUNTER — Ambulatory Visit (INDEPENDENT_AMBULATORY_CARE_PROVIDER_SITE_OTHER): Payer: Medicare Other | Admitting: Medical-Surgical

## 2020-12-02 VITALS — BP 170/80 | HR 79 | Temp 97.8°F | Ht 61.0 in | Wt 201.7 lb

## 2020-12-02 DIAGNOSIS — R0781 Pleurodynia: Secondary | ICD-10-CM

## 2020-12-02 DIAGNOSIS — R921 Mammographic calcification found on diagnostic imaging of breast: Secondary | ICD-10-CM | POA: Diagnosis not present

## 2020-12-02 DIAGNOSIS — I517 Cardiomegaly: Secondary | ICD-10-CM | POA: Diagnosis not present

## 2020-12-02 MED ORDER — TIZANIDINE HCL 2 MG PO CAPS: 2.0000 mg | ORAL_CAPSULE | Freq: Three times a day (TID) | ORAL | 0 refills | Status: DC

## 2020-12-02 MED ORDER — LIDOCAINE 5 % EX PTCH: 1.0000 | MEDICATED_PATCH | CUTANEOUS | 0 refills | Status: DC

## 2020-12-02 NOTE — Progress Notes (Signed)
Subjective:    CC: right rib pain just below breast  HPI: Pleasant 70 year old female presenting for evaluation of pain just below her right breast that started on awakening this morning at 5am. Noted pain started when she rolled out of her waterbed and over the rail. Since then, she has had an ache in the area with the occasional sharp, stabbing pains. No alleviating or aggravating factors identified. No interventions tried. Denies fever, chills, other chest pain, shortness of breath, palpitations, or injury/trauma.   I reviewed the past medical history, family history, social history, surgical history, and allergies today and no changes were needed.  Please see the problem list section below in epic for further details.  Past Medical History: Past Medical History:  Diagnosis Date  . Chronic kidney disease, stage 2, mildly decreased GFR   . Diabetes mellitus   . Heavy menses   . Hyperlipidemia   . Hypertension   . Obesity    Past Surgical History: Past Surgical History:  Procedure Laterality Date  . CATARACT EXTRACTION  1-09   right   . CATARACT EXTRACTION  09/2011   left   . CHOLECYSTECTOMY     laproscopic  . rt elbow screws placed  10-82   Social History: Social History   Socioeconomic History  . Marital status: Married    Spouse name: Not on file  . Number of children: Not on file  . Years of education: Not on file  . Highest education level: Not on file  Occupational History  . Not on file  Tobacco Use  . Smoking status: Never Smoker  . Smokeless tobacco: Never Used  Substance and Sexual Activity  . Alcohol use: Yes    Comment: rare  . Drug use: Not on file  . 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  Other Topics Concern  . Not on file  Social History Narrative   She cloggs for fun.     Social Determinants of Health   Financial  Resource Strain: Not on file  Food Insecurity: Not on file  Transportation Needs: Not on file  Physical Activity: Not on file  Stress: Not on file  Social Connections: Not on file   Family History: Family History  Problem Relation Age of Onset  . Cancer Father 29       colon and prostate CA  . Cancer Mother 57       carcinoid tumor/with mets to brain  . Hypertension Sister   . Hyperlipidemia Sister   . Hypertension Brother   . Hypertension Brother   . Hypertension Sister   . Hyperlipidemia Sister   . Hypertension Sister   . Hyperlipidemia Sister   . Hypertension Sister   . Hyperlipidemia Sister   . Lymphoma Sister        non-hodgkins    Allergies: No Known Allergies Medications: See med rec.  Review of Systems: See HPI for pertinent positives and negatives.   Objective:    General: Well Developed, well nourished, and in no acute distress.  Neuro: Alert and oriented x3. HEENT: Normocephalic, atraumatic. Skin: Warm and dry. No rashes, erythema, or lesions in the area of pain below the right breast.  Cardiac: Regular rate and rhythm, no murmurs rubs or gallops, no lower extremity edema.  Respiratory: Clear to auscultation bilaterally. Not using accessory muscles, speaking in full sentences. MSK:  Tenderness to palpation 1-2 inches below the right breast crease at the midclavicular line.   Impression and Recommendations:    1. Rib pain on right side No dermatological findings consistent with shingles. Advised patient to notify the office if she develops a rash at the site. Getting x-rays of the right ribs to evaluate for potential fracture from rolling over the waterbed rail. Suspect costochondritis or soft tissue trauma caused by getting out of bed. Recommend prn Tylenol and Tizanidine. Sending in prescription for Lidocaine  Ok to use heat or ice (whichever feels better) but avoid using in conjunction with Lidocaine patches.patches. Avoiding NSAIDs due to history of  bleeding gastric ulcer.   Return if symptoms worsen or fail to improve. ___________________________________________ Clearnce Sorrel, DNP, APRN, FNP-BC [facility] [place]

## 2021-02-03 DIAGNOSIS — L814 Other melanin hyperpigmentation: Secondary | ICD-10-CM | POA: Diagnosis not present

## 2021-02-03 DIAGNOSIS — L570 Actinic keratosis: Secondary | ICD-10-CM | POA: Diagnosis not present

## 2021-02-03 DIAGNOSIS — L579 Skin changes due to chronic exposure to nonionizing radiation, unspecified: Secondary | ICD-10-CM | POA: Diagnosis not present

## 2021-02-03 DIAGNOSIS — L821 Other seborrheic keratosis: Secondary | ICD-10-CM | POA: Diagnosis not present

## 2021-02-03 DIAGNOSIS — L819 Disorder of pigmentation, unspecified: Secondary | ICD-10-CM | POA: Diagnosis not present

## 2021-02-03 DIAGNOSIS — D225 Melanocytic nevi of trunk: Secondary | ICD-10-CM | POA: Diagnosis not present

## 2021-02-16 ENCOUNTER — Ambulatory Visit (INDEPENDENT_AMBULATORY_CARE_PROVIDER_SITE_OTHER): Payer: Medicare Other | Admitting: Family Medicine

## 2021-02-16 ENCOUNTER — Encounter: Payer: Self-pay | Admitting: Family Medicine

## 2021-02-16 ENCOUNTER — Other Ambulatory Visit: Payer: Self-pay

## 2021-02-16 VITALS — BP 128/72 | HR 67 | Ht 61.0 in | Wt 197.0 lb

## 2021-02-16 DIAGNOSIS — K264 Chronic or unspecified duodenal ulcer with hemorrhage: Secondary | ICD-10-CM | POA: Diagnosis not present

## 2021-02-16 DIAGNOSIS — E118 Type 2 diabetes mellitus with unspecified complications: Secondary | ICD-10-CM | POA: Diagnosis not present

## 2021-02-16 DIAGNOSIS — N1831 Chronic kidney disease, stage 3a: Secondary | ICD-10-CM

## 2021-02-16 DIAGNOSIS — Z6835 Body mass index (BMI) 35.0-35.9, adult: Secondary | ICD-10-CM

## 2021-02-16 DIAGNOSIS — E038 Other specified hypothyroidism: Secondary | ICD-10-CM | POA: Diagnosis not present

## 2021-02-16 DIAGNOSIS — I10 Essential (primary) hypertension: Secondary | ICD-10-CM

## 2021-02-16 DIAGNOSIS — K760 Fatty (change of) liver, not elsewhere classified: Secondary | ICD-10-CM

## 2021-02-16 LAB — POCT GLYCOSYLATED HEMOGLOBIN (HGB A1C): Hemoglobin A1C: 6.8 % — AB (ref 4.0–5.6)

## 2021-02-16 MED ORDER — FERROUS SULFATE 325 (65 FE) MG PO TABS: 325.0000 mg | ORAL_TABLET | Freq: Every day | ORAL | 1 refills | Status: DC

## 2021-02-16 MED ORDER — LEVOTHYROXINE SODIUM 75 MCG PO TABS: 75.0000 ug | ORAL_TABLET | Freq: Every day | ORAL | 3 refills | Status: DC

## 2021-02-16 NOTE — Assessment & Plan Note (Signed)
Plan to recheck thyroid level today it has been a little over a year since we last checked it but she is taking her medication regularly.

## 2021-02-16 NOTE — Assessment & Plan Note (Signed)
Well controlled. Continue current regimen. Follow up in  3 mo .  

## 2021-02-16 NOTE — Assessment & Plan Note (Signed)
A1c up just slightly to 6.8 it was 6.6 last time.  Continue to work on healthy diet regular exercise and weight loss.

## 2021-02-16 NOTE — Assessment & Plan Note (Signed)
She has recently started a weight loss program and is doing well so far.  She started the program about 3 weeks ago.  She is used it successfully in the past.

## 2021-02-16 NOTE — Assessment & Plan Note (Signed)
Due to recheck liver enzymes. 

## 2021-02-16 NOTE — Assessment & Plan Note (Signed)
Due to recheck renal function. 

## 2021-02-16 NOTE — Assessment & Plan Note (Signed)
She would like to come off the PPI. Discussed how to taper the medication. And discussed foods to avoid and NSAID to reduce her risk of recurrent ulcer.

## 2021-02-16 NOTE — Progress Notes (Signed)
Established Patient Office Visit  Subjective:  Patient ID: Emma Craig, female    DOB: Oct 01, 1951  Age: 70 y.o. MRN: [id number]  CC:  Chief Complaint  Patient presents with  . Hypertension  . Diabetes    HPI Emma Craig presents for   Hypertension- Pt denies chest pain, SOB, dizziness, or heart palpitations.  Taking meds as directed w/o problems.  Denies medication side effects.    Diabetes - no hypoglycemic events. No wounds or sores that are not healing well. No increased thirst or urination. Checking glucose at home. Taking medications as prescribed without any side effects.  Hypothyroidism - Taking medication regularly in the AM away from food and vitamins, etc. No recent change to skin, hair, or energy levels.  F/U CKD 3 - due to recheck renal function.   She recently started a weight loss program with a protein drink as a meal replacement for 2 of her meals and she is eating a mostly vegetable diet for her evening meal.  He said she had done the program years ago and did well with that and has recently restarted it about 3 weeks ago.  Past Medical History:  Diagnosis Date  . Chronic kidney disease, stage 2, mildly decreased GFR   . Diabetes mellitus   . Heavy menses   . Hyperlipidemia   . Hypertension   . Obesity     Past Surgical History:  Procedure Laterality Date  . CATARACT EXTRACTION  1-09   right   . CATARACT EXTRACTION  09/2011   left   . CHOLECYSTECTOMY     laproscopic  . rt elbow screws placed  10-82    Family History  Problem Relation Age of Onset  . Cancer Father 54       colon and prostate CA  . Cancer Mother 10       carcinoid tumor/with mets to brain  . Hypertension Sister   . Hyperlipidemia Sister   . Hypertension Brother   . Hypertension Brother   . Hypertension Sister   . Hyperlipidemia Sister   . Hypertension Sister   . Hyperlipidemia Sister   . Hypertension Sister   . Hyperlipidemia Sister   . Lymphoma Sister        non-hodgkins      Social History   Socioeconomic History  . Marital status: Married    Spouse name: Not on file  . Number of children: Not on file  . Years of education: Not on file  . Highest education level: Not on file  Occupational History  . Not on file  Tobacco Use  . Smoking status: Never Smoker  . Smokeless tobacco: Never Used  Substance and Sexual Activity  . Alcohol use: Yes    Comment: rare  . Drug use: Not on file  . 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  Other Topics Concern  . Not on file  Social History Narrative   She cloggs for fun.     Social Determinants of Health   Financial Resource Strain: Not on file  Food Insecurity: Not on file  Transportation Needs: Not on file  Physical Activity: Not on file  Stress: Not on file  Social Connections: Not on file  Intimate Partner Violence: Not on file    Outpatient Medications Prior to Visit  Medication Sig Dispense Refill  . atorvastatin (LIPITOR)  40 MG tablet TAKE 1 TABLET(40 MG) BY MOUTH DAILY 90 tablet 3  . Calcium-Magnesium-Vitamin D (CALCIUM 1200+D3 PO) Take 1 tablet by mouth daily.    . metoprolol (TOPROL-XL) 200 MG 24 hr tablet Take 1 tablet (200 mg total) by mouth daily. 90 tablet 1  . olmesartan (BENICAR) 40 MG tablet TAKE 1 TABLET DAILY 90 tablet 3  . Cholecalciferol (VITAMIN D-3) 1000 UNITS CAPS Take 1 tablet by mouth daily.    . ferrous sulfate 325 (65 FE) MG tablet Take 1 tablet (325 mg total) by mouth daily. 90 tablet 1  . lidocaine (LIDODERM) 5 % Place 1 patch onto the skin daily. Remove & Discard patch within 12 hours or as directed by MD 30 patch 0  . pantoprazole (PROTONIX) 40 MG tablet Take 1 tablet (40 mg total) by mouth daily. 90 tablet 3  . SYNTHROID 75 MCG tablet TAKE 1 TABLET DAILY BEFORE BREAKFAST 90 tablet 3  . tizanidine (ZANAFLEX) 2 MG capsule Take 1 capsule (2 mg total) by  mouth 3 (three) times daily. 21 capsule 0   No facility-administered medications prior to visit.    No Known Allergies  ROS Review of Systems    Objective:    Physical Exam Constitutional:      Appearance: She is well-developed.  HENT:     Head: Normocephalic and atraumatic.  Cardiovascular:     Rate and Rhythm: Normal rate and regular rhythm.     Heart sounds: Normal heart sounds.  Pulmonary:     Effort: Pulmonary effort is normal.     Breath sounds: Normal breath sounds.  Skin:    General: Skin is warm and dry.  Neurological:     Mental Status: She is alert and oriented to person, place, and time.  Psychiatric:        Behavior: Behavior normal.     BP 128/72   Pulse 67   Ht 5\' 1"  (1.549 m)   Wt 197 lb (89.4 kg)   LMP  (LMP Unknown)   SpO2 99%   BMI 37.22 kg/m  Wt Readings from Last 3 Encounters:  02/16/21 197 lb (89.4 kg)  12/02/20 201 lb 11.2 oz (91.5 kg)  10/13/20 204 lb (92.5 kg)     Health Maintenance Due  Topic Date Due  . COVID-19 Vaccine (3 - Booster for Moderna series) 09/09/2020    There are no preventive care reminders to display for this patient.  Lab Results  Component Value Date   TSH 0.92 10/08/2019   Lab Results  Component Value Date   WBC 10.8 08/09/2019   HGB 9.8 (L) 08/09/2019   HCT 29.4 (L) 08/09/2019   MCV 91.0 08/09/2019   PLT 303 08/09/2019   Lab Results  Component Value Date   NA 138 11/11/2020   K 5.2 11/11/2020   CO2 29 11/11/2020   GLUCOSE 107 (H) 11/11/2020   BUN 21 11/11/2020   CREATININE 0.92 11/11/2020   BILITOT 0.3 11/11/2020   ALKPHOS 90 02/21/2017   AST 35 11/11/2020   ALT 51 (H) 11/11/2020   PROT 6.5 11/11/2020   ALBUMIN 4.1 02/21/2017   CALCIUM 9.2 11/11/2020   Lab Results  Component Value Date   CHOL 187 10/06/2020   Lab Results  Component Value Date   HDL 47 (L) 10/06/2020   Lab Results  Component Value Date   LDLCALC 106 (H) 10/06/2020   Lab Results  Component Value Date   TRIG 223  (H) 10/06/2020   Lab  Results  Component Value Date   CHOLHDL 4.0 10/06/2020   Lab Results  Component Value Date   HGBA1C 6.8 (A) 02/16/2021      Assessment & Plan:   Problem List Items Addressed This Visit      Cardiovascular and Mediastinum   Essential hypertension - Primary    Well controlled. Continue current regimen. Follow up in  3 mo       Relevant Orders   TSH + free T4   COMPLETE METABOLIC PANEL WITH GFR     Digestive   Gastrointestinal hemorrhage associated with duodenal ulcer    She would like to come off the PPI. Discussed how to taper the medication. And discussed foods to avoid and NSAID to reduce her risk of recurrent ulcer.        Fatty liver    Due to recheck liver enzymes.      Relevant Orders   COMPLETE METABOLIC PANEL WITH GFR     Endocrine   Hypothyroidism    Plan to recheck thyroid level today it has been a little over a year since we last checked it but she is taking her medication regularly.      Relevant Medications   levothyroxine (SYNTHROID) 75 MCG tablet   Other Relevant Orders   TSH + free T4   COMPLETE METABOLIC PANEL WITH GFR   Controlled diabetes mellitus type 2 with complications ([facility])    [id number] up just slightly to 6.8 it was 6.6 last time.  Continue to work on healthy diet regular exercise and weight loss.      Relevant Orders   TSH + free T4   COMPLETE METABOLIC PANEL WITH GFR   POCT glycosylated hemoglobin (Hb A1C) (Completed)     Genitourinary   CKD (chronic kidney disease) stage 3, GFR 30-59 ml/min (HCC)    Due to recheck renal function.      Relevant Orders   COMPLETE METABOLIC PANEL WITH GFR     Other   Severe obesity (BMI 35.0-35.9 with comorbidity) ([place])    She has recently started a weight loss program and is doing well so far.  She started the program about 3 weeks ago.  She is used it successfully in the past.         Meds ordered this encounter  Medications  . ferrous sulfate 325 (65 FE) MG tablet     Sig: Take 1 tablet (325 mg total) by mouth daily.    Dispense:  90 tablet    Refill:  1  . levothyroxine (SYNTHROID) 75 MCG tablet    Sig: Take 1 tablet (75 mcg total) by mouth daily before breakfast.    Dispense:  90 tablet    Refill:  3    Follow-up: Return in about 3 months (around 05/19/2021) for Diabetes follow-up.    Beatrice Lecher, MD

## 2021-03-11 DIAGNOSIS — N1831 Chronic kidney disease, stage 3a: Secondary | ICD-10-CM | POA: Diagnosis not present

## 2021-03-11 DIAGNOSIS — I10 Essential (primary) hypertension: Secondary | ICD-10-CM | POA: Diagnosis not present

## 2021-03-11 DIAGNOSIS — E038 Other specified hypothyroidism: Secondary | ICD-10-CM | POA: Diagnosis not present

## 2021-03-11 DIAGNOSIS — E118 Type 2 diabetes mellitus with unspecified complications: Secondary | ICD-10-CM | POA: Diagnosis not present

## 2021-03-11 DIAGNOSIS — K760 Fatty (change of) liver, not elsewhere classified: Secondary | ICD-10-CM | POA: Diagnosis not present

## 2021-03-12 LAB — COMPLETE METABOLIC PANEL WITH GFR
AG Ratio: 1.5 (calc) (ref 1.0–2.5)
ALT: 46 U/L — ABNORMAL HIGH (ref 6–29)
AST: 37 U/L — ABNORMAL HIGH (ref 10–35)
Albumin: 4.3 g/dL (ref 3.6–5.1)
Alkaline phosphatase (APISO): 87 U/L (ref 37–153)
BUN/Creatinine Ratio: 14 (calc) (ref 6–22)
BUN: 16 mg/dL (ref 7–25)
CO2: 30 mmol/L (ref 20–32)
Calcium: 9.4 mg/dL (ref 8.6–10.4)
Chloride: 102 mmol/L (ref 98–110)
Creat: 1.13 mg/dL — ABNORMAL HIGH (ref 0.50–0.99)
GFR, Est African American: 58 mL/min/{1.73_m2} — ABNORMAL LOW (ref >=60)
GFR, Est Non African American: 50 mL/min/{1.73_m2} — ABNORMAL LOW (ref >=60)
Globulin: 2.8 g/dL (calc) (ref 1.9–3.7)
Glucose, Bld: 97 mg/dL (ref 65–99)
Potassium: 4 mmol/L (ref 3.5–5.3)
Sodium: 142 mmol/L (ref 135–146)
Total Bilirubin: 0.3 mg/dL (ref 0.2–1.2)
Total Protein: 7.1 g/dL (ref 6.1–8.1)

## 2021-03-12 LAB — TSH+FREE T4: TSH W/REFLEX TO FT4: 1.31 mIU/L (ref 0.40–4.50)

## 2021-03-12 NOTE — Progress Notes (Signed)
Emma Craig,   Thyroid looks great.  Liver enzymes are back up a bit but mildly.  Kidney function down some from 4 months ago. Did you start any new medication are you taking any anti-inflammatories?   Dr. Jerilynn Mages is out of office. My name is Iran Planas PA-C.

## 2021-03-20 ENCOUNTER — Other Ambulatory Visit: Payer: Self-pay | Admitting: *Deleted

## 2021-03-20 DIAGNOSIS — R748 Abnormal levels of other serum enzymes: Secondary | ICD-10-CM

## 2021-04-15 DIAGNOSIS — R748 Abnormal levels of other serum enzymes: Secondary | ICD-10-CM | POA: Diagnosis not present

## 2021-04-16 LAB — COMPLETE METABOLIC PANEL WITH GFR
AG Ratio: 1.7 (calc) (ref 1.0–2.5)
ALT: 44 U/L — ABNORMAL HIGH (ref 6–29)
AST: 35 U/L (ref 10–35)
Albumin: 4.1 g/dL (ref 3.6–5.1)
Alkaline phosphatase (APISO): 76 U/L (ref 37–153)
BUN: 18 mg/dL (ref 7–25)
CO2: 28 mmol/L (ref 20–32)
Calcium: 9.1 mg/dL (ref 8.6–10.4)
Chloride: 103 mmol/L (ref 98–110)
Creat: 0.9 mg/dL (ref 0.50–0.99)
GFR, Est African American: 77 mL/min/{1.73_m2} (ref >=60)
GFR, Est Non African American: 66 mL/min/{1.73_m2} (ref >=60)
Globulin: 2.4 g/dL (calc) (ref 1.9–3.7)
Glucose, Bld: 74 mg/dL (ref 65–139)
Potassium: 4.2 mmol/L (ref 3.5–5.3)
Sodium: 142 mmol/L (ref 135–146)
Total Bilirubin: 0.3 mg/dL (ref 0.2–1.2)
Total Protein: 6.5 g/dL (ref 6.1–8.1)

## 2021-05-25 ENCOUNTER — Other Ambulatory Visit: Payer: Self-pay

## 2021-05-25 ENCOUNTER — Ambulatory Visit (INDEPENDENT_AMBULATORY_CARE_PROVIDER_SITE_OTHER): Payer: Medicare Other | Admitting: Family Medicine

## 2021-05-25 ENCOUNTER — Encounter: Payer: Self-pay | Admitting: Family Medicine

## 2021-05-25 VITALS — BP 132/68 | HR 68 | Ht 61.0 in | Wt 198.0 lb

## 2021-05-25 DIAGNOSIS — E038 Other specified hypothyroidism: Secondary | ICD-10-CM | POA: Diagnosis not present

## 2021-05-25 DIAGNOSIS — D649 Anemia, unspecified: Secondary | ICD-10-CM | POA: Diagnosis not present

## 2021-05-25 DIAGNOSIS — D500 Iron deficiency anemia secondary to blood loss (chronic): Secondary | ICD-10-CM

## 2021-05-25 DIAGNOSIS — I10 Essential (primary) hypertension: Secondary | ICD-10-CM | POA: Diagnosis not present

## 2021-05-25 DIAGNOSIS — E118 Type 2 diabetes mellitus with unspecified complications: Secondary | ICD-10-CM

## 2021-05-25 LAB — POCT GLYCOSYLATED HEMOGLOBIN (HGB A1C): Hemoglobin A1C: 6.2 % — AB (ref 4.0–5.6)

## 2021-05-25 MED ORDER — OLMESARTAN MEDOXOMIL 40 MG PO TABS
40.0000 mg | ORAL_TABLET | Freq: Every day | ORAL | 3 refills | Status: DC
Start: 2021-05-25 — End: 2021-11-02

## 2021-05-25 MED ORDER — METOPROLOL SUCCINATE ER 200 MG PO TB24
200.0000 mg | ORAL_TABLET | Freq: Every day | ORAL | 3 refills | Status: DC
Start: 2021-05-25 — End: 2022-06-03

## 2021-05-25 NOTE — Assessment & Plan Note (Signed)
Stable on regimen. Check TSH

## 2021-05-25 NOTE — Assessment & Plan Note (Signed)
Well controlled. Continue current regimen. Follow up in  4 mo 

## 2021-05-25 NOTE — Progress Notes (Signed)
Established Patient Office Visit  Subjective:  Patient ID: Emma Craig, female    DOB: 09/25/1951  Age: 70 y.o. MRN: [id number]  CC:  Chief Complaint  Patient presents with   Diabetes    HPI Emma Craig presents for   Diabetes - no hypoglycemic events. No wounds or sores that are not healing well. No increased thirst or urination. Checking glucose at home. Taking medications as prescribed without any side effects.  She would like to discontinue her iron as well. She has been on it for 2 yrs now.  Hx of GI bleed. No recent blood in her stool  Hypertension- Pt denies chest pain, SOB, dizziness, or heart palpitations.  Taking meds as directed w/o problems.  Denies medication side effects.  Recently went to her niece's wedding and had a great time she was able to visit with her siblings.  Hypothyroidism - Taking medication regularly in the AM away from food and vitamins, etc. No recent change to skin, hair, or energy levels.   Past Medical History:  Diagnosis Date   Chronic kidney disease, stage 2, mildly decreased GFR    Diabetes mellitus    Heavy menses    Hyperlipidemia    Hypertension    Obesity     Past Surgical History:  Procedure Laterality Date   CATARACT EXTRACTION  1-09   right    CATARACT EXTRACTION  09/2011   left    CHOLECYSTECTOMY     laproscopic   rt elbow screws placed  10-82    Family History  Problem Relation Age of Onset   Cancer Father 62       colon and prostate CA   Cancer Mother 33       carcinoid tumor/with mets to brain   Hypertension Sister    Hyperlipidemia Sister    Hypertension Brother    Hypertension Brother    Hypertension Sister    Hyperlipidemia Sister    Hypertension Sister    Hyperlipidemia Sister    Hypertension Sister    Hyperlipidemia Sister    Lymphoma Sister        non-hodgkins     Social History   Socioeconomic History   Marital status: Married    Spouse name: Not on file   Number of children: Not on file    Years of education: Not on file   Highest education level: Not on file  Occupational History   Not on file  Tobacco Use   Smoking status: Never   Smokeless tobacco: Never  Substance and Sexual Activity   Alcohol use: Yes    Comment: rare   Drug use: Not on file   Sexual activity: Not on file    Comment: 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  Other Topics Concern   Not on file  Social History Narrative   She cloggs for fun.     Social Determinants of Health   Financial Resource Strain: Not on file  Food Insecurity: Not on file  Transportation Needs: Not on file  Physical Activity: Not on file  Stress: Not on file  Social Connections: Not on file  Intimate Partner Violence: Not on file    Outpatient Medications Prior to Visit  Medication Sig Dispense Refill   atorvastatin (LIPITOR) 40 MG tablet TAKE 1 TABLET(40 MG) BY MOUTH DAILY 90 tablet 3   Calcium-Magnesium-Vitamin D (CALCIUM 1200+D3 PO) Take 1 tablet by  mouth daily.     ferrous sulfate 325 (65 FE) MG tablet Take 1 tablet (325 mg total) by mouth daily. 90 tablet 1   levothyroxine (SYNTHROID) 75 MCG tablet Take 1 tablet (75 mcg total) by mouth daily before breakfast. 90 tablet 3   metoprolol (TOPROL-XL) 200 MG 24 hr tablet Take 1 tablet (200 mg total) by mouth daily. 90 tablet 1   olmesartan (BENICAR) 40 MG tablet TAKE 1 TABLET DAILY 90 tablet 3   No facility-administered medications prior to visit.    No Known Allergies  ROS Review of Systems    Objective:    Physical Exam Constitutional:      Appearance: She is well-developed.  HENT:     Head: Normocephalic and atraumatic.  Cardiovascular:     Rate and Rhythm: Normal rate and regular rhythm.     Heart sounds: Normal heart sounds.  Pulmonary:     Effort: Pulmonary effort is normal.     Breath sounds: Normal breath sounds.  Skin:    General: Skin is warm and dry.   Neurological:     Mental Status: She is alert and oriented to person, place, and time.  Psychiatric:        Behavior: Behavior normal.    BP 132/68   Pulse 68   Ht 5\' 1"  (1.549 m)   Wt 198 lb (89.8 kg)   LMP  (LMP Unknown)   PF 97 L/min   BMI 37.41 kg/m  Wt Readings from Last 3 Encounters:  05/25/21 198 lb (89.8 kg)  02/16/21 197 lb (89.4 kg)  12/02/20 201 lb 11.2 oz (91.5 kg)     Health Maintenance Due  Topic Date Due   COVID-19 Vaccine (4 - Booster) 06/09/2020    There are no preventive care reminders to display for this patient.  Lab Results  Component Value Date   TSH 0.92 10/08/2019   Lab Results  Component Value Date   WBC 10.8 08/09/2019   HGB 9.8 (L) 08/09/2019   HCT 29.4 (L) 08/09/2019   MCV 91.0 08/09/2019   PLT 303 08/09/2019   Lab Results  Component Value Date   NA 142 04/15/2021   K 4.2 04/15/2021   CO2 28 04/15/2021   GLUCOSE 74 04/15/2021   BUN 18 04/15/2021   CREATININE 0.90 04/15/2021   BILITOT 0.3 04/15/2021   ALKPHOS 90 02/21/2017   AST 35 04/15/2021   ALT 44 (H) 04/15/2021   PROT 6.5 04/15/2021   ALBUMIN 4.1 02/21/2017   CALCIUM 9.1 04/15/2021   Lab Results  Component Value Date   CHOL 187 10/06/2020   Lab Results  Component Value Date   HDL 47 (L) 10/06/2020   Lab Results  Component Value Date   LDLCALC 106 (H) 10/06/2020   Lab Results  Component Value Date   TRIG 223 (H) 10/06/2020   Lab Results  Component Value Date   CHOLHDL 4.0 10/06/2020   Lab Results  Component Value Date   HGBA1C 6.2 (A) 05/25/2021      Assessment & Plan:   Problem List Items Addressed This Visit       Cardiovascular and Mediastinum   Essential hypertension   Relevant Medications   olmesartan (BENICAR) 40 MG tablet   metoprolol (TOPROL-XL) 200 MG 24 hr tablet     Endocrine   Hypothyroidism    Stable on regimen. Check TSH       Relevant Medications   metoprolol (TOPROL-XL) 200 MG 24 hr tablet  Other Relevant Orders    TSH   Controlled diabetes mellitus type 2 with complications ([place]) - Primary    Well controlled. Continue current regimen. Follow up in  4 mo        Relevant Medications   olmesartan (BENICAR) 40 MG tablet   Other Relevant Orders   POCT HgB A1C (Completed)     Other   Anemia    Lan to recheck iron levels so we can see if we can discontinue her daily iron supplement.       Relevant Orders   Fe+TIBC+Fer   Fe+TIBC+Fer    Meds ordered this encounter  Medications   olmesartan (BENICAR) 40 MG tablet    Sig: Take 1 tablet (40 mg total) by mouth daily.    Dispense:  90 tablet    Refill:  3   metoprolol (TOPROL-XL) 200 MG 24 hr tablet    Sig: Take 1 tablet (200 mg total) by mouth daily.    Dispense:  90 tablet    Refill:  3    Follow-up: Return in about 4 months (around 09/24/2021) for Diabetes follow-up.    Beatrice Lecher, MD

## 2021-05-25 NOTE — Assessment & Plan Note (Signed)
Lan to recheck iron levels so we can see if we can discontinue her daily iron supplement.

## 2021-05-26 ENCOUNTER — Other Ambulatory Visit: Payer: Self-pay | Admitting: *Deleted

## 2021-05-26 LAB — IRON,TIBC AND FERRITIN PANEL
%SAT: 29 % (calc) (ref 16–45)
Ferritin: 146 ng/mL (ref 16–288)
Iron: 99 ug/dL (ref 45–160)
TIBC: 345 mcg/dL (calc) (ref 250–450)

## 2021-05-26 LAB — TSH: TSH: 1.61 mIU/L (ref 0.40–4.50)

## 2021-08-14 ENCOUNTER — Ambulatory Visit (INDEPENDENT_AMBULATORY_CARE_PROVIDER_SITE_OTHER): Payer: Medicare Other | Admitting: Family Medicine

## 2021-08-14 DIAGNOSIS — Z780 Asymptomatic menopausal state: Secondary | ICD-10-CM | POA: Diagnosis not present

## 2021-08-14 DIAGNOSIS — Z0000 Encounter for general adult medical examination without abnormal findings: Secondary | ICD-10-CM

## 2021-08-14 DIAGNOSIS — Z1231 Encounter for screening mammogram for malignant neoplasm of breast: Secondary | ICD-10-CM | POA: Diagnosis not present

## 2021-08-14 NOTE — Progress Notes (Signed)
MEDICARE ANNUAL WELLNESS VISIT  08/14/2021  Telephone Visit Disclaimer This Medicare AWV was conducted by telephone due to national recommendations for restrictions regarding the COVID-19 Pandemic (e.g. social distancing).  I verified, using two identifiers, that I am speaking with Emma Craig or their authorized healthcare agent. I discussed the limitations, risks, security, and privacy concerns of performing an evaluation and management service by telephone and the potential availability of an in-person appointment in the future. The patient expressed understanding and agreed to proceed.  Location of Patient: Home Location of Provider (nurse):  In the office.  Subjective:    Emma Craig is a 70 y.o. female patient of Metheney, Rene Kocher, MD who had a Medicare Annual Wellness Visit today via telephone. Emma Craig is Retired and lives with their spouse. she has 2 children. she reports that she is socially active and does interact with friends/family regularly. she is minimally physically active and enjoys bowling.  Patient Care Team: Hali Marry, MD as PCP - General (Family Medicine)  Advanced Directives 08/14/2021 05/01/2019 07/27/2018 04/06/2017 03/12/2014  Does Patient Have a Medical Advance Directive? Yes Yes Yes No Patient does not have advance directive;Patient would like information  Type of Advance Directive Living will;Healthcare Power of [place];Living will [place] - -  Does patient want to make changes to medical advance directive? No - Patient declined - - - -  Copy of [place] in Chart? No - copy requested No - copy requested No - copy requested - -  Would patient like information on creating a medical advance directive? - - - No - Patient declined Advance directive packet given    Hospital Utilization Over the Past 12 Months: # of hospitalizations or ER visits: 0 # of surgeries: 0  Review of Systems     Patient reports that her overall health is unchanged compared to last year.  History obtained from chart review and the patient  Patient Reported Readings (BP, Pulse, CBG, Weight, etc) none  Pain Assessment Pain : No/denies pain     Current Medications & Allergies (verified) Allergies as of 08/14/2021   No Known Allergies      Medication List        Accurate as of August 14, 2021  3:14 PM. If you have any questions, ask your nurse or doctor.          atorvastatin 40 MG tablet Commonly known as: LIPITOR TAKE 1 TABLET(40 MG) BY MOUTH DAILY   CALCIUM 1200+D3 PO Take 1 tablet by mouth daily.   levothyroxine 75 MCG tablet Commonly known as: Synthroid Take 1 tablet (75 mcg total) by mouth daily before breakfast.   metoprolol 200 MG 24 hr tablet Commonly known as: TOPROL-XL Take 1 tablet (200 mg total) by mouth daily.   olmesartan 40 MG tablet Commonly known as: BENICAR Take 1 tablet (40 mg total) by mouth daily.        History (reviewed): Past Medical History:  Diagnosis Date   Chronic kidney disease, stage 2, mildly decreased GFR    Diabetes mellitus    Heavy menses    Hyperlipidemia    Hypertension    Obesity    Past Surgical History:  Procedure Laterality Date   CATARACT EXTRACTION  1-09   right    CATARACT EXTRACTION  09/2011   left    CHOLECYSTECTOMY     laproscopic   rt elbow screws placed  10-82   Family History  Problem Relation Age of Onset   Cancer Father 51       colon and prostate CA   Cancer Mother 84       carcinoid tumor/with mets to brain   Hypertension Sister    Hyperlipidemia Sister    Hypertension Brother    Hypertension Brother    Hypertension Sister    Hyperlipidemia Sister    Hypertension Sister    Hyperlipidemia Sister    Hypertension Sister    Hyperlipidemia Sister    Lymphoma Sister        non-hodgkins    Social History   Socioeconomic History   Marital status: Married    Spouse name: Emma Craig   Number  of children: 2   Years of education: 23   Highest education level: Some college, no degree  Occupational History   Occupation: Reitred  Tobacco Use   Smoking status: Never   Smokeless tobacco: Never  Substance and Sexual Activity   Alcohol use: Yes    Comment: rare   Drug use: Not on file   Sexual activity: Not on file  Other Topics Concern   Not on file  Social History Narrative   Lives with her husband. She has two children. She enjoys bowling in her free time.   Social Determinants of Health   Financial Resource Strain: Low Risk    Difficulty of Paying Living Expenses: Not hard at all  Food Insecurity: No Food Insecurity   Worried About Charity fundraiser in the Last Year: Never true   [place] in the Last Year: Never true  Transportation Needs: No Transportation Needs   Lack of Transportation (Medical): No   Lack of Transportation (Non-Medical): No  Physical Activity: Inactive   Days of Exercise per Week: 0 days   Minutes of Exercise per Session: 0 min  Stress: No Stress Concern Present   Feeling of Stress : Not at all  Social Connections: Moderately Integrated   Frequency of Communication with Friends and Family: More than three times a week   Frequency of Social Gatherings with Friends and Family: More than three times a week   Attends Religious Services: More than 4 times per year   Active Member of [employer] or Organizations: No   Attends Archivist Meetings: Never   Marital Status: Married    Activities of Daily Living In your present state of health, do you have any difficulty performing the following activities: 08/14/2021  Hearing? N  Vision? N  Difficulty concentrating or making decisions? N  Walking or climbing stairs? N  Dressing or bathing? N  Doing errands, shopping? N  Preparing Food and eating ? N  Using the Toilet? N  In the past six months, have you accidently leaked urine? N  Do you have problems with loss of bowel control? N   Managing your Medications? N  Managing your Finances? N  Housekeeping or managing your Housekeeping? N  Some recent data might be hidden    Patient Education/ Literacy How often do you need to have someone help you when you read instructions, pamphlets, or other written materials from your doctor or pharmacy?: 1 - Never What is the last grade level you completed in school?: 12th grade  Exercise Current Exercise Habits: The patient does not participate in regular exercise at present, Exercise limited by: None identified  Diet Patient reports consuming 3 meals a day and 0 snack(s) a day Patient reports that her primary diet is:  Regular Patient reports that she does have regular access to food.   Depression Screen PHQ 2/9 Scores 08/14/2021 02/16/2021 10/13/2020 10/08/2019 01/29/2019 07/27/2018 01/25/2018  PHQ - 2 Score 0 0 0 0 0 0 0     Fall Risk Fall Risk  08/14/2021 10/13/2020 10/08/2019 07/27/2018 01/25/2018  Falls in the past year? 0 0 1 No Yes  Number falls in past yr: 0 - 0 - -  Injury with Fall? 0 - 1 - -  Risk for fall due to : No Fall Risks No Fall Risks Other (Comment) - -  Risk for fall due to: Comment - - pt got dizzy - -  Follow up Falls evaluation completed - Falls prevention discussed - -     Objective:  Ruba Sobotta seemed alert and oriented and she participated appropriately during our telephone visit.  Blood Pressure Weight BMI  BP Readings from Last 3 Encounters:  05/25/21 132/68  02/16/21 128/72  12/02/20 (!) 170/80   Wt Readings from Last 3 Encounters:  05/25/21 198 lb (89.8 kg)  02/16/21 197 lb (89.4 kg)  12/02/20 201 lb 11.2 oz (91.5 kg)   BMI Readings from Last 1 Encounters:  05/25/21 37.41 kg/m    *Unable to obtain current vital signs, weight, and BMI due to telephone visit type  Hearing/Vision  Meara did not seem to have difficulty with hearing/understanding during the telephone conversation Reports that she has had a formal eye exam by an eye care  professional within the past year Reports that she has not had a formal hearing evaluation within the past year *Unable to fully assess hearing and vision during telephone visit type  Cognitive Function: 6CIT Screen 08/14/2021 07/27/2018  What Year? 0 points 0 points  What month? 0 points 0 points  What time? 0 points 0 points  Count back from 20 0 points 0 points  Months in reverse 0 points 0 points  Repeat phrase 0 points 6 points  Total Score 0 6   (Normal:0-7, Significant for Dysfunction: >8)  Normal Cognitive Function Screening: Yes   Immunization & Health Maintenance Record Immunization History  Administered Date(s) Administered   Moderna Sars-Covid-2 Vaccination 01/28/2020, 02/24/2020, 03/10/2020   Td 02/14/2009   Tdap 12/02/2019   Zoster Recombinat (Shingrix) 05/24/2018, 07/27/2018   Zoster, Live 01/10/2015    Health Maintenance  Topic Date Due   COVID-19 Vaccine (4 - Booster) 08/30/2021 (Originally 06/02/2020)   DEXA SCAN  12/02/2021 (Originally 08/09/2020)   INFLUENZA VACCINE  11/29/2023 (Originally 06/29/2021)   OPHTHALMOLOGY EXAM  09/09/2021   FOOT EXAM  10/13/2021   HEMOGLOBIN A1C  11/24/2021   MAMMOGRAM  09/18/2022   COLONOSCOPY (Pts 45-53yr Insurance coverage will need to be confirmed)  11/12/2022   TETANUS/TDAP  12/01/2029   Hepatitis C Screening  Completed   Zoster Vaccines- Shingrix  Completed   HPV VACCINES  Aged Out       Assessment  This is a routine wellness examination for [employer]  Health Maintenance: Due or Overdue There are no preventive care reminders to display for this patient.   PSharhonda Stockwelldoes not need a referral for Community Assistance: Care Management:   no Social Work:    no Prescription Assistance:  no Nutrition/Diabetes Education:  no   Plan:  Personalized Goals  Goals Addressed               This Visit's Progress     Patient Stated (pt-stated)        08/14/2021 AWV  Goal: Exercise for General Health  Patient  will verbalize understanding of the benefits of increased physical activity: Exercising regularly is important. It will improve your overall fitness, flexibility, and endurance. Regular exercise also will improve your overall health. It can help you control your weight, reduce stress, and improve your bone density. Over the next year, patient will increase physical activity as tolerated with a goal of at least 150 minutes of moderate physical activity per week.  You can tell that you are exercising at a moderate intensity if your heart starts beating faster and you start breathing faster but can still hold a conversation. Moderate-intensity exercise ideas include: Walking 1 mile (1.6 km) in about 15 minutes Biking Hiking Golfing Dancing Water aerobics Patient will verbalize understanding of everyday activities that increase physical activity by providing examples like the following: Yard work, such as: Sales promotion account executive Gardening Washing windows or floors Patient will be able to explain general safety guidelines for exercising:  Before you start a new exercise program, talk with your health care provider. Do not exercise so much that you hurt yourself, feel dizzy, or get very short of breath. Wear comfortable clothes and wear shoes with good support. Drink plenty of water while you exercise to prevent dehydration or heat stroke. Work out until your breathing and your heartbeat get faster.        Personalized Health Maintenance & Screening Recommendations  Pneumococcal vaccine  Influenza vaccine Screening mammography Bone densitometry screening Eye exam - scheduled for 09/11/21.  Patient declined the vaccines at this time.  Lung Cancer Screening Recommended: no (Low Dose CT Chest recommended if Age 26-80 years, 30 pack-year currently smoking OR have quit w/in past 15 years) Hepatitis C Screening  recommended: no HIV Screening recommended: no  Advanced Directives: Written information was not prepared per patient's request.  Referrals & Orders Orders Placed This Encounter  Procedures   [place] Follow-up with Hali Marry, MD as planned Referral for bone density and mammogram has been sent and they will call you to schedule. Medicare wellness visit in one year. AVS printed and mailed to the patient.   I have personally reviewed and noted the following in the patient's chart:   Medical and social history Use of alcohol, tobacco or illicit drugs  Current medications and supplements Functional ability and status Nutritional status Physical activity Advanced directives List of other physicians Hospitalizations, surgeries, and ER visits in previous 12 months Vitals Screenings to include cognitive, depression, and falls Referrals and appointments  In addition, I have reviewed and discussed with Emma Craig certain preventive protocols, quality metrics, and best practice recommendations. A written personalized care plan for preventive services as well as general preventive health recommendations is available and can be mailed to the patient at her request.      Tinnie Gens, RN  08/14/2021

## 2021-08-14 NOTE — Patient Instructions (Addendum)
[place] Maintenance Summary and Written Plan of Care  Ms. Dethloff ,  Thank you for allowing me to perform your Medicare Annual Wellness Visit and for your ongoing commitment to your health.   Health Maintenance & Immunization History Health Maintenance  Topic Date Due   COVID-19 Vaccine (4 - Booster) 08/30/2021 (Originally 06/02/2020)   DEXA SCAN  12/02/2021 (Originally 08/09/2020)   INFLUENZA VACCINE  11/29/2023 (Originally 06/29/2021)   OPHTHALMOLOGY EXAM  09/09/2021   FOOT EXAM  10/13/2021   HEMOGLOBIN A1C  11/24/2021   MAMMOGRAM  09/18/2022   COLONOSCOPY (Pts 45-55yr Insurance coverage will need to be confirmed)  11/12/2022   TETANUS/TDAP  12/01/2029   Hepatitis C Screening  Completed   Zoster Vaccines- Shingrix  Completed   HPV VACCINES  Aged Out   Immunization History  Administered Date(s) Administered   MMarriottVaccination 01/28/2020, 02/24/2020, 03/10/2020   Td 02/14/2009   Tdap 12/02/2019   Zoster Recombinat (Shingrix) 05/24/2018, 07/27/2018   Zoster, Live 01/10/2015    These are the patient goals that we discussed:  Goals Addressed               This Visit's Progress     Patient Stated (pt-stated)        08/14/2021 AWV Goal: Exercise for General Health  Patient will verbalize understanding of the benefits of increased physical activity: Exercising regularly is important. It will improve your overall fitness, flexibility, and endurance. Regular exercise also will improve your overall health. It can help you control your weight, reduce stress, and improve your bone density. Over the next year, patient will increase physical activity as tolerated with a goal of at least 150 minutes of moderate physical activity per week.  You can tell that you are exercising at a moderate intensity if your heart starts beating faster and you start breathing faster but can still hold a conversation. Moderate-intensity exercise ideas  include: Walking 1 mile (1.6 km) in about 15 minutes Biking Hiking Golfing Dancing Water aerobics Patient will verbalize understanding of everyday activities that increase physical activity by providing examples like the following: Yard work, such as: PSales promotion account executiveGardening Washing windows or floors Patient will be able to explain general safety guidelines for exercising:  Before you start a new exercise program, talk with your health care provider. Do not exercise so much that you hurt yourself, feel dizzy, or get very short of breath. Wear comfortable clothes and wear shoes with good support. Drink plenty of water while you exercise to prevent dehydration or heat stroke. Work out until your breathing and your heartbeat get faster.          This is a list of Health Maintenance Items that are overdue or due now: Pneumococcal vaccine  Influenza vaccine Td vaccine Screening mammography Bone densitometry screening  Orders/Referrals Placed Today: Orders Placed This Encounter  Procedures   DEXAScan    Standing Status:   Future    Standing Expiration Date:   08/14/2022    Scheduling Instructions:     Please call patient to schedule.    Order Specific Question:   Reason for exam:    Answer:   Post Menopausal    Order Specific Question:   Preferred imaging location?    [facility] 3D SCREEN BREAST BILATERAL    Standing Status:   Future    Standing  Expiration Date:   08/14/2022    Scheduling Instructions:     Patient    Order Specific Question:   Reason for Exam (SYMPTOM  OR DIAGNOSIS REQUIRED)    Answer:   Breast cancer screening    Order Specific Question:   Preferred imaging location?    Answer:   [facility]   (Contact our referral department at [phone] if you have not spoken with someone about your referral appointment within the next  5 days)    Follow-up Plan Follow-up with Hali Marry, MD as planned Referral for bone density and mammogram has been sent and they will call you to schedule. Medicare wellness visit in one year. AVS printed and mailed to the patient.

## 2021-09-11 DIAGNOSIS — H524 Presbyopia: Secondary | ICD-10-CM | POA: Diagnosis not present

## 2021-09-11 DIAGNOSIS — E119 Type 2 diabetes mellitus without complications: Secondary | ICD-10-CM | POA: Diagnosis not present

## 2021-09-11 LAB — HM DIABETES EYE EXAM

## 2021-09-14 ENCOUNTER — Other Ambulatory Visit: Payer: Self-pay | Admitting: Family Medicine

## 2021-09-23 ENCOUNTER — Other Ambulatory Visit: Payer: Self-pay

## 2021-09-23 ENCOUNTER — Ambulatory Visit (INDEPENDENT_AMBULATORY_CARE_PROVIDER_SITE_OTHER): Payer: Medicare Other

## 2021-09-23 DIAGNOSIS — M8589 Other specified disorders of bone density and structure, multiple sites: Secondary | ICD-10-CM | POA: Diagnosis not present

## 2021-09-23 DIAGNOSIS — Z0000 Encounter for general adult medical examination without abnormal findings: Secondary | ICD-10-CM

## 2021-09-23 DIAGNOSIS — Z780 Asymptomatic menopausal state: Secondary | ICD-10-CM | POA: Diagnosis not present

## 2021-09-23 DIAGNOSIS — Z1231 Encounter for screening mammogram for malignant neoplasm of breast: Secondary | ICD-10-CM | POA: Diagnosis not present

## 2021-09-24 NOTE — Progress Notes (Signed)
Call patient: Bone density test shows a T score of -2.4 this is in the mildly thought thin category called osteopenia but it is right on the border for full osteoporosis.  Recommend repeat bone density in 2 years.   The current recommendation for osteopenia (mildly thin bones) treatment includes:   #1 calcium-total of 1200 mg of calcium daily.  If you eat a very calcium rich diet you may be able to obtain that without a supplement.  If not, then I recommend calcium 500 mg twice a day.  There are several products over-the-counter such as Caltrate D and Viactiv chews which are great options that contain calcium and vitamin D. #2 vitamin D-recommend 800 international units daily. #3 exercise-recommend 30 minutes of weightbearing exercise 3 days a week.  Resistance training ,such as doing bands and light weights, can be particularly helpful.

## 2021-09-25 NOTE — Progress Notes (Signed)
Please call patient. Normal mammogram.  Repeat in 1 year.  

## 2021-09-28 ENCOUNTER — Ambulatory Visit: Payer: Medicare Other | Admitting: Family Medicine

## 2021-09-29 ENCOUNTER — Other Ambulatory Visit: Payer: Self-pay

## 2021-09-29 ENCOUNTER — Ambulatory Visit (INDEPENDENT_AMBULATORY_CARE_PROVIDER_SITE_OTHER): Payer: Medicare Other | Admitting: Family Medicine

## 2021-09-29 ENCOUNTER — Encounter: Payer: Self-pay | Admitting: Family Medicine

## 2021-09-29 VITALS — BP 154/69 | HR 71 | Temp 97.8°F | Ht 61.0 in | Wt 205.0 lb

## 2021-09-29 DIAGNOSIS — E1169 Type 2 diabetes mellitus with other specified complication: Secondary | ICD-10-CM | POA: Diagnosis not present

## 2021-09-29 DIAGNOSIS — N1831 Chronic kidney disease, stage 3a: Secondary | ICD-10-CM

## 2021-09-29 DIAGNOSIS — I10 Essential (primary) hypertension: Secondary | ICD-10-CM

## 2021-09-29 DIAGNOSIS — E785 Hyperlipidemia, unspecified: Secondary | ICD-10-CM | POA: Diagnosis not present

## 2021-09-29 DIAGNOSIS — R748 Abnormal levels of other serum enzymes: Secondary | ICD-10-CM | POA: Diagnosis not present

## 2021-09-29 DIAGNOSIS — E118 Type 2 diabetes mellitus with unspecified complications: Secondary | ICD-10-CM

## 2021-09-29 LAB — POCT GLYCOSYLATED HEMOGLOBIN (HGB A1C): Hemoglobin A1C: 6.3 % — AB (ref 4.0–5.6)

## 2021-09-29 NOTE — Assessment & Plan Note (Signed)
A1c looks good today at 6.3.  Continue with healthy diet.  Continue to work on getting more regular exercise and.

## 2021-09-29 NOTE — Assessment & Plan Note (Signed)
Plan to recheck liver enzymes.

## 2021-09-29 NOTE — Assessment & Plan Note (Signed)
Due to recheck lipids.  She wants to come back later this month to have that done.

## 2021-09-29 NOTE — Assessment & Plan Note (Signed)
Blood pressure still elevated today.  She reports that her home blood pressures have been better.

## 2021-09-29 NOTE — Assessment & Plan Note (Signed)
Continuing to follow renal function every 6 months. 

## 2021-09-29 NOTE — Progress Notes (Signed)
Established Patient Office Visit  Subjective:  Patient ID: Emma Craig, female    DOB: 08/07/1951  Age: 70 y.o. MRN: [id number]  CC:  Chief Complaint  Patient presents with   Diabetes    HPI Osceola Holian presents for   Diabetes - no hypoglycemic events. No wounds or sores that are not healing well. No increased thirst or urination. Checking glucose at home. Taking medications as prescribed without any side effects.  Hypertension- Pt denies chest pain, SOB, dizziness, or heart palpitations.  Taking meds as directed w/o problems.  Denies medication side effects.    She has not been exercising regularly.  Unfortunately her clogging group dissipated because some of the members were becoming older.  She is not been able to find a new group.   Past Medical History:  Diagnosis Date   Chronic kidney disease, stage 2, mildly decreased GFR    Diabetes mellitus    Heavy menses    Hyperlipidemia    Hypertension    Obesity     Past Surgical History:  Procedure Laterality Date   CATARACT EXTRACTION  1-09   right    CATARACT EXTRACTION  09/2011   left    CHOLECYSTECTOMY     laproscopic   rt elbow screws placed  10-82    Family History  Problem Relation Age of Onset   Cancer Father 17       colon and prostate CA   Cancer Mother 45       carcinoid tumor/with mets to brain   Hypertension Sister    Hyperlipidemia Sister    Hypertension Brother    Hypertension Brother    Hypertension Sister    Hyperlipidemia Sister    Hypertension Sister    Hyperlipidemia Sister    Hypertension Sister    Hyperlipidemia Sister    Lymphoma Sister        non-hodgkins     Social History   Socioeconomic History   Marital status: Married    Spouse name: Tommy   Number of children: 2   Years of education: 13   Highest education level: Some college, no degree  Occupational History   Occupation: [employer]  Tobacco Use   Smoking status: Never   Smokeless tobacco: Never  Substance and Sexual  Activity   Alcohol use: Yes    Comment: rare   Drug use: Not on file   Sexual activity: Not on file  Other Topics Concern   Not on file  Social History Narrative   Lives with her husband. She has two children. She enjoys bowling in her free time.   Social Determinants of Health   Financial Resource Strain: Low Risk    Difficulty of Paying Living Expenses: Not hard at all  Food Insecurity: No Food Insecurity   Worried About Charity fundraiser in the Last Year: Never true   [place] in the Last Year: Never true  Transportation Needs: No Transportation Needs   Lack of Transportation (Medical): No   Lack of Transportation (Non-Medical): No  Physical Activity: Inactive   Days of Exercise per Week: 0 days   Minutes of Exercise per Session: 0 min  Stress: No Stress Concern Present   Feeling of Stress : Not at all  Social Connections: Moderately Integrated   Frequency of Communication with Friends and Family: More than three times a week   Frequency of Social Gatherings with Friends and Family: More than three times a week   Attends  Religious Services: More than 4 times per year   Active Member of Clubs or Organizations: No   Attends Archivist Meetings: Never   Marital Status: Married  Human resources officer Violence: Not At Risk   Fear of Current or Ex-Partner: No   Emotionally Abused: No   Physically Abused: No   Sexually Abused: No    Outpatient Medications Prior to Visit  Medication Sig Dispense Refill   atorvastatin (LIPITOR) 40 MG tablet TAKE 1 TABLET(40 MG) BY MOUTH DAILY 90 tablet 3   Calcium-Magnesium-Vitamin D (CALCIUM 1200+D3 PO) Take 1 tablet by mouth daily.     levothyroxine (SYNTHROID) 75 MCG tablet Take 1 tablet (75 mcg total) by mouth daily before breakfast. 90 tablet 3   metoprolol (TOPROL-XL) 200 MG 24 hr tablet Take 1 tablet (200 mg total) by mouth daily. 90 tablet 3   olmesartan (BENICAR) 40 MG tablet Take 1 tablet (40 mg total) by mouth daily. 90  tablet 3   No facility-administered medications prior to visit.    No Known Allergies  ROS Review of Systems    Objective:    Physical Exam Constitutional:      Appearance: Normal appearance. She is well-developed.  HENT:     Head: Normocephalic and atraumatic.  Cardiovascular:     Rate and Rhythm: Normal rate and regular rhythm.     Heart sounds: Normal heart sounds.  Pulmonary:     Effort: Pulmonary effort is normal.     Breath sounds: Normal breath sounds.  Skin:    General: Skin is warm and dry.  Neurological:     Mental Status: She is alert and oriented to person, place, and time.  Psychiatric:        Behavior: Behavior normal.    BP (!) 154/69   Pulse 71   Temp 97.8 F (36.6 C)   Ht 5\' 1"  (1.549 m)   Wt 205 lb (93 kg)   LMP  (LMP Unknown)   SpO2 99%   BMI 38.73 kg/m  Wt Readings from Last 3 Encounters:  09/29/21 205 lb (93 kg)  05/25/21 198 lb (89.8 kg)  02/16/21 197 lb (89.4 kg)     There are no preventive care reminders to display for this patient.  There are no preventive care reminders to display for this patient.  Lab Results  Component Value Date   TSH 1.61 05/25/2021   Lab Results  Component Value Date   WBC 10.8 08/09/2019   HGB 9.8 (L) 08/09/2019   HCT 29.4 (L) 08/09/2019   MCV 91.0 08/09/2019   PLT 303 08/09/2019   Lab Results  Component Value Date   NA 142 04/15/2021   K 4.2 04/15/2021   CO2 28 04/15/2021   GLUCOSE 74 04/15/2021   BUN 18 04/15/2021   CREATININE 0.90 04/15/2021   BILITOT 0.3 04/15/2021   ALKPHOS 90 02/21/2017   AST 35 04/15/2021   ALT 44 (H) 04/15/2021   PROT 6.5 04/15/2021   ALBUMIN 4.1 02/21/2017   CALCIUM 9.1 04/15/2021   Lab Results  Component Value Date   CHOL 187 10/06/2020   Lab Results  Component Value Date   HDL 47 (L) 10/06/2020   Lab Results  Component Value Date   LDLCALC 106 (H) 10/06/2020   Lab Results  Component Value Date   TRIG 223 (H) 10/06/2020   Lab Results  Component  Value Date   CHOLHDL 4.0 10/06/2020   Lab Results  Component Value Date   HGBA1C  6.3 (A) 09/29/2021      Assessment & Plan:   Problem List Items Addressed This Visit       Cardiovascular and Mediastinum   Essential hypertension    Blood pressure still elevated today.  She reports that her home blood pressures have been better.      Relevant Orders   Lipid Panel w/reflex Direct LDL   COMPLETE METABOLIC PANEL WITH GFR   CBC     Endocrine   Hyperlipidemia associated with type 2 diabetes mellitus ([place])    Due to recheck lipids.  She wants to come back later this month to have that done.      Relevant Orders   Lipid Panel w/reflex Direct LDL   COMPLETE METABOLIC PANEL WITH GFR   CBC   Controlled diabetes mellitus type 2 with complications ([facility]) - Primary    A1c looks good today at 6.3.  Continue with healthy diet.  Continue to work on getting more regular exercise and.      Relevant Orders   POCT glycosylated hemoglobin (Hb A1C) (Completed)   Lipid Panel w/reflex Direct LDL   COMPLETE METABOLIC PANEL WITH GFR   CBC     Genitourinary   CKD (chronic kidney disease) stage 3, GFR 30-59 ml/min (HCC)    Continuing to follow renal function every 6 months.      Relevant Orders   Lipid Panel w/reflex Direct LDL   COMPLETE METABOLIC PANEL WITH GFR   CBC     Other   Elevated liver enzymes    Plan to recheck liver enzymes.      Did encourage her to check into some other options for regular exercise.  No orders of the defined types were placed in this encounter.   Follow-up: Return in about 4 months (around 01/27/2022) for Hypertension , Diabetes follow-up.    Beatrice Lecher, MD

## 2021-10-14 DIAGNOSIS — I10 Essential (primary) hypertension: Secondary | ICD-10-CM | POA: Diagnosis not present

## 2021-10-14 DIAGNOSIS — E785 Hyperlipidemia, unspecified: Secondary | ICD-10-CM | POA: Diagnosis not present

## 2021-10-14 DIAGNOSIS — E118 Type 2 diabetes mellitus with unspecified complications: Secondary | ICD-10-CM | POA: Diagnosis not present

## 2021-10-14 DIAGNOSIS — E1169 Type 2 diabetes mellitus with other specified complication: Secondary | ICD-10-CM | POA: Diagnosis not present

## 2021-10-14 DIAGNOSIS — N1831 Chronic kidney disease, stage 3a: Secondary | ICD-10-CM | POA: Diagnosis not present

## 2021-10-14 LAB — COMPLETE METABOLIC PANEL WITH GFR
AG Ratio: 1.6 (calc) (ref 1.0–2.5)
ALT: 40 U/L — ABNORMAL HIGH (ref 6–29)
AST: 35 U/L (ref 10–35)
Albumin: 4.2 g/dL (ref 3.6–5.1)
Alkaline phosphatase (APISO): 78 U/L (ref 37–153)
BUN: 20 mg/dL (ref 7–25)
CO2: 29 mmol/L (ref 20–32)
Calcium: 9.2 mg/dL (ref 8.6–10.4)
Chloride: 101 mmol/L (ref 98–110)
Creat: 0.97 mg/dL (ref 0.50–1.05)
Globulin: 2.6 g/dL (calc) (ref 1.9–3.7)
Glucose, Bld: 114 mg/dL — ABNORMAL HIGH (ref 65–99)
Potassium: 5.5 mmol/L — ABNORMAL HIGH (ref 3.5–5.3)
Sodium: 137 mmol/L (ref 135–146)
Total Bilirubin: 0.4 mg/dL (ref 0.2–1.2)
Total Protein: 6.8 g/dL (ref 6.1–8.1)
eGFR: 64 mL/min/{1.73_m2} (ref >=60)

## 2021-10-14 LAB — LIPID PANEL W/REFLEX DIRECT LDL
Cholesterol: 202 mg/dL — ABNORMAL HIGH (ref <200)
HDL: 56 mg/dL (ref >=50)
LDL Cholesterol (Calc): 111 mg/dL (calc) — ABNORMAL HIGH
Non-HDL Cholesterol (Calc): 146 mg/dL (calc) — ABNORMAL HIGH (ref <130)
Total CHOL/HDL Ratio: 3.6 (calc) (ref <5.0)
Triglycerides: 233 mg/dL — ABNORMAL HIGH (ref <150)

## 2021-10-14 LAB — CBC
HCT: 43.8 % (ref 35.0–45.0)
Hemoglobin: 14.5 g/dL (ref 11.7–15.5)
MCH: 31 pg (ref 27.0–33.0)
MCHC: 33.1 g/dL (ref 32.0–36.0)
MCV: 93.8 fL (ref 80.0–100.0)
MPV: 10.3 fL (ref 7.5–12.5)
Platelets: 239 10*3/uL (ref 140–400)
RBC: 4.67 10*6/uL (ref 3.80–5.10)
RDW: 12.3 % (ref 11.0–15.0)
WBC: 7.7 10*3/uL (ref 3.8–10.8)

## 2021-10-15 NOTE — Progress Notes (Signed)
Call patient: LDL cholesterol is still little elevated up a little bit compared to last couple years.  Just encouraged her to continue to work on healthy diet and regular exercise to bring that down.  Metabolic panel overall looks okay.  Her potassium is actually a little elevated.  I am not sure why but I definitely want to recheck that in about 2 weeks.  The ALT liver enzyme is still mildly elevated similar to last time the AST is normal.  Hemoglobin looks great.  No sign of anemia.  Also please make sure she schedules a nurse follow-up visit in a couple weeks to recheck her blood pressure and know the home pressures have been a little bit better but it was still pretty high here that day and look for to come back and recheck that.

## 2021-10-16 ENCOUNTER — Other Ambulatory Visit: Payer: Self-pay | Admitting: *Deleted

## 2021-10-16 DIAGNOSIS — E875 Hyperkalemia: Secondary | ICD-10-CM

## 2021-11-02 ENCOUNTER — Other Ambulatory Visit: Payer: Self-pay

## 2021-11-02 ENCOUNTER — Ambulatory Visit (INDEPENDENT_AMBULATORY_CARE_PROVIDER_SITE_OTHER): Payer: Medicare Other | Admitting: Family Medicine

## 2021-11-02 VITALS — BP 143/60 | HR 68

## 2021-11-02 DIAGNOSIS — E875 Hyperkalemia: Secondary | ICD-10-CM | POA: Diagnosis not present

## 2021-11-02 DIAGNOSIS — I10 Essential (primary) hypertension: Secondary | ICD-10-CM

## 2021-11-02 MED ORDER — OLMESARTAN MEDOXOMIL-HCTZ 40-12.5 MG PO TABS: 1.0000 | ORAL_TABLET | Freq: Every day | ORAL | 0 refills | Status: DC

## 2021-11-02 NOTE — Progress Notes (Signed)
Pt here for nurse BP check.  Pt denies CP, SOB, headaches, or dizziness.  Pt states that she  missed dose of medication last week.  Charyl Bigger, CMA

## 2021-11-02 NOTE — Progress Notes (Signed)
Pt informed.  Pt expressed understanding and is agreeable.  T. Aava Deland, CMA  

## 2021-11-02 NOTE — Progress Notes (Signed)
HTN -blood pressure does look a little bit better today about 10 points lower than it was when she came in for her visit in November.  We will switch to Benicar HCT.  The low-dose of HCT will help lower blood pressure and help with her potassium issue.  I sent to her mail order so when she gets that and hopefully in a couple of weeks she can start the new 1 in place of her plain omelsartan 40 mg social just need to make sure that she stops omelsartan 40 and takes the new 1.  And then when she has been on it for about 2 to 3 weeks I would like for her to come back for another nurse blood pressure check.

## 2021-11-03 ENCOUNTER — Other Ambulatory Visit: Payer: Self-pay | Admitting: Family Medicine

## 2021-11-03 LAB — BASIC METABOLIC PANEL
BUN: 19 mg/dL (ref 7–25)
CO2: 29 mmol/L (ref 20–32)
Calcium: 9.9 mg/dL (ref 8.6–10.4)
Chloride: 101 mmol/L (ref 98–110)
Creat: 0.95 mg/dL (ref 0.50–1.05)
Glucose, Bld: 113 mg/dL — ABNORMAL HIGH (ref 65–99)
Potassium: 6.2 mmol/L (ref 3.5–5.3)
Sodium: 139 mmol/L (ref 135–146)

## 2021-11-03 MED ORDER — HYDROCHLOROTHIAZIDE 12.5 MG PO CAPS: 12.5000 mg | ORAL_CAPSULE | Freq: Every day | ORAL | 0 refills | Status: DC

## 2021-11-03 NOTE — Addendum Note (Signed)
Addended by: Teddy Spike on: 11/03/2021 05:04 PM   Modules accepted: Orders

## 2021-11-03 NOTE — Progress Notes (Signed)
Call patient: Her potassium went even higher 2 weeks ago was 5.5 it is now up to 6.2.  Had sent over a new blood pressure pill that will help lower potassium but I had sent it to her mail order some get a send over a short supply of a medication called HCTZ to her local pharmacy.  I will send over 2 weeks worth and I want her to take it with her current blood pressure pill and then lets recheck her potassium on Friday.  And also like to schedule her for an ultrasound to look at the arteries to her kidneys if she is okay with that please let me know and I can place an order.  Try to avoid foods that have a lot of potassium in them if she is eating anything specifically that might have excess potassium.

## 2021-11-04 ENCOUNTER — Other Ambulatory Visit: Payer: Self-pay | Admitting: Family Medicine

## 2021-11-04 DIAGNOSIS — N1831 Chronic kidney disease, stage 3a: Secondary | ICD-10-CM

## 2021-11-04 DIAGNOSIS — I10 Essential (primary) hypertension: Secondary | ICD-10-CM

## 2021-11-04 DIAGNOSIS — E875 Hyperkalemia: Secondary | ICD-10-CM

## 2021-11-04 NOTE — Progress Notes (Signed)
Orders Placed This Encounter     Basic Metabolic Panel (BMET)     BASIC METABOLIC PANEL WITH GFR  [place] order placed for renal arteries

## 2021-11-06 DIAGNOSIS — E875 Hyperkalemia: Secondary | ICD-10-CM | POA: Diagnosis not present

## 2021-11-07 LAB — BASIC METABOLIC PANEL WITH GFR
BUN/Creatinine Ratio: 22 (calc) (ref 6–22)
BUN: 24 mg/dL (ref 7–25)
CO2: 28 mmol/L (ref 20–32)
Calcium: 10 mg/dL (ref 8.6–10.4)
Chloride: 99 mmol/L (ref 98–110)
Creat: 1.1 mg/dL — ABNORMAL HIGH (ref 0.50–1.05)
Glucose, Bld: 117 mg/dL — ABNORMAL HIGH (ref 65–99)
Potassium: 5.1 mmol/L (ref 3.5–5.3)
Sodium: 138 mmol/L (ref 135–146)
eGFR: 55 mL/min/{1.73_m2} — ABNORMAL LOW (ref >=60)

## 2021-11-09 NOTE — Progress Notes (Signed)
Call EZ:[id number] is back to normal  Yah!!!!  Kidney function still just mildly elevated but stable.  Just make sure hydrating well.

## 2021-11-15 ENCOUNTER — Other Ambulatory Visit: Payer: Self-pay | Admitting: Family Medicine

## 2021-11-16 ENCOUNTER — Ambulatory Visit (HOSPITAL_COMMUNITY)
Admission: RE | Admit: 2021-11-16 | Discharge: 2021-11-16 | Disposition: A | Discharge status: Home / Self Care | Payer: Medicare Other | Source: Ambulatory Visit | Attending: Cardiology | Admitting: Cardiology

## 2021-11-16 ENCOUNTER — Other Ambulatory Visit: Payer: Self-pay

## 2021-11-16 ENCOUNTER — Other Ambulatory Visit: Payer: Self-pay | Admitting: *Deleted

## 2021-11-16 DIAGNOSIS — E875 Hyperkalemia: Secondary | ICD-10-CM | POA: Diagnosis not present

## 2021-11-16 DIAGNOSIS — I10 Essential (primary) hypertension: Secondary | ICD-10-CM

## 2021-11-16 DIAGNOSIS — N1831 Chronic kidney disease, stage 3a: Secondary | ICD-10-CM | POA: Insufficient documentation

## 2021-11-24 NOTE — Progress Notes (Signed)
Call patient: The good news is is that there is no blockage or significant narrowing of the arteries to her kidneys which is very reassuring.  Size of her kidneys also look normal so no sign of injury or atrophy.  They did note a cyst at the bottom of the right kidney measuring 3.9 x 3.2 cm.  As of this size I would like to do a CT with contrast ~work this up further.  Please let me know if she is okay with that.

## 2021-11-25 ENCOUNTER — Telehealth: Payer: Self-pay | Admitting: Family Medicine

## 2021-11-25 DIAGNOSIS — N2889 Other specified disorders of kidney and ureter: Secondary | ICD-10-CM

## 2021-11-25 NOTE — Progress Notes (Signed)
Order placed for CT

## 2021-11-25 NOTE — Telephone Encounter (Signed)
Orders Placed This Encounter  Procedures   CT RENAL ABDOMEN W WO CONTRAST    Indeterminate cyst on the right kidney greater than 3 cm.    Standing Status:   Future    Standing Expiration Date:   11/25/2022    Order Specific Question:   If indicated for the ordered procedure, I authorize the administration of contrast media per Radiology protocol    Answer:   Yes    Order Specific Question:   Preferred imaging location?    Answer:   [facility]

## 2021-12-01 ENCOUNTER — Other Ambulatory Visit: Payer: Self-pay

## 2021-12-01 ENCOUNTER — Ambulatory Visit (INDEPENDENT_AMBULATORY_CARE_PROVIDER_SITE_OTHER): Payer: Medicare Other

## 2021-12-01 DIAGNOSIS — N2889 Other specified disorders of kidney and ureter: Secondary | ICD-10-CM | POA: Diagnosis not present

## 2021-12-01 DIAGNOSIS — N281 Cyst of kidney, acquired: Secondary | ICD-10-CM | POA: Diagnosis not present

## 2021-12-01 MED ORDER — IOHEXOL 300 MG/ML  SOLN
100.0000 mL | Freq: Once | INTRAMUSCULAR | Status: AC | PRN
  Administered 2021-12-01: 100 mL via INTRAVENOUS

## 2021-12-02 ENCOUNTER — Encounter: Payer: Self-pay | Admitting: Family Medicine

## 2021-12-02 DIAGNOSIS — N281 Cyst of kidney, acquired: Secondary | ICD-10-CM | POA: Insufficient documentation

## 2021-12-02 NOTE — Progress Notes (Signed)
Call patient: No suspicious mass seen on the kidneys which is fantastic.  You do have a 3.5 cm cyst on the right kidney which is mildly increased in size since previous study but it does not have any concerning or worrisome features which is great.  He have a smaller 0.7 cm cyst on the left kidney.  You do have a small hernia at your bellybutton which is just incidental.  And the liver does have a lot of fat content.  We call that hepatic steatosis.  This can eventually scar and cause cirrhosis of the liver so just really encourage her to work on healthy diet and regular exercise and some mild weight loss.

## 2021-12-31 ENCOUNTER — Telehealth: Payer: Self-pay

## 2021-12-31 ENCOUNTER — Other Ambulatory Visit: Payer: Self-pay | Admitting: Neurology

## 2021-12-31 MED ORDER — ATORVASTATIN CALCIUM 40 MG PO TABS: ORAL_TABLET | ORAL | 2 refills | Status: DC

## 2021-12-31 NOTE — Telephone Encounter (Signed)
Patient called stating her insurance did not cover her Bone Density. Patient stated her bill was over $200 and would like to have the codes for her bone density resubmitted. Spoke with Dr. Madilyn Fireman, can resubmit Bone Density for Osteopenia. Forward to [place].

## 2022-01-01 NOTE — Telephone Encounter (Signed)
I called the billing department and they will file the claim again with Z13.820 and M85.80. Patient is aware.

## 2022-02-01 ENCOUNTER — Other Ambulatory Visit: Payer: Self-pay

## 2022-02-01 ENCOUNTER — Encounter: Payer: Self-pay | Admitting: Family Medicine

## 2022-02-01 ENCOUNTER — Ambulatory Visit (INDEPENDENT_AMBULATORY_CARE_PROVIDER_SITE_OTHER): Payer: Medicare Other | Admitting: Family Medicine

## 2022-02-01 VITALS — BP 122/78 | HR 79 | Ht 61.0 in | Wt 202.0 lb

## 2022-02-01 DIAGNOSIS — E118 Type 2 diabetes mellitus with unspecified complications: Secondary | ICD-10-CM | POA: Diagnosis not present

## 2022-02-01 DIAGNOSIS — E038 Other specified hypothyroidism: Secondary | ICD-10-CM

## 2022-02-01 DIAGNOSIS — I10 Essential (primary) hypertension: Secondary | ICD-10-CM

## 2022-02-01 LAB — POCT GLYCOSYLATED HEMOGLOBIN (HGB A1C): Hemoglobin A1C: 6.1 % — AB (ref 4.0–5.6)

## 2022-02-01 MED ORDER — LEVOTHYROXINE SODIUM 75 MCG PO TABS: 75.0000 ug | ORAL_TABLET | Freq: Every day | ORAL | 3 refills | Status: DC

## 2022-02-01 MED ORDER — OLMESARTAN MEDOXOMIL-HCTZ 40-12.5 MG PO TABS: 1.0000 | ORAL_TABLET | Freq: Every day | ORAL | 1 refills | Status: DC

## 2022-02-01 NOTE — Assessment & Plan Note (Signed)
Plan to recheck TSH in about 3 months.   ?

## 2022-02-01 NOTE — Assessment & Plan Note (Signed)
A1c looks great today at 6.1.  Continue current regimen she is really done a fantastic job.  Her A1c is down, blood pressure looks great and she is down 3 pounds on our scale but 6 pounds on home scale. ?

## 2022-02-01 NOTE — Assessment & Plan Note (Signed)
Blood pressure looks great today.  We will continue with the Benicar HCT.  New prescription sent to mail order. ?

## 2022-02-01 NOTE — Progress Notes (Signed)
? ?Established Patient Office Visit ? ?Subjective:  ?Patient ID: Emma Craig, female    DOB: Nov 21, 1951  Age: 71 y.o. MRN: [id number] ? ?CC:  ?Chief Complaint  ?Patient presents with  ? Diabetes  ? ? ?HPI ?Emma Craig presents for  ? ?Hypertension- Pt denies chest pain, SOB, dizziness, or heart palpitations.  Taking meds as directed w/o problems.  Denies medication side effects.   ? ?Diabetes - no hypoglycemic events. No wounds or sores that are not healing well. No increased thirst or urination. Checking glucose at home. Taking medications as prescribed without any side effects. ? ?Has been work on her weight. She has lost about 6 lbs with the Atkins form of diet. She I sgonig to water aerobics as well.  She really wants to get back into [employer] as well but currently there is a conflict with her water aerobics class which she really enjoys. ? ? ? ?Past Medical History:  ?Diagnosis Date  ? Chronic kidney disease, stage 2, mildly decreased GFR   ? Diabetes mellitus   ? Heavy menses   ? Hyperlipidemia   ? Hypertension   ? Obesity   ? ? ?Past Surgical History:  ?Procedure Laterality Date  ? CATARACT EXTRACTION  1-09  ? right   ? CATARACT EXTRACTION  09/2011  ? left   ? CHOLECYSTECTOMY    ? laproscopic  ? rt elbow screws placed  10-82  ? ? ?Family History  ?Problem Relation Age of Onset  ? Cancer Father 9  ?     colon and prostate CA  ? Cancer Mother 67  ?     carcinoid tumor/with mets to brain  ? Hypertension Sister   ? Hyperlipidemia Sister   ? Hypertension Brother   ? Hypertension Brother   ? Hypertension Sister   ? Hyperlipidemia Sister   ? Hypertension Sister   ? Hyperlipidemia Sister   ? Hypertension Sister   ? Hyperlipidemia Sister   ? Lymphoma Sister   ?     non-hodgkins   ? ? ?Social History  ? ?Socioeconomic History  ? Marital status: Married  ?  Spouse name: Konrad Dolores  ? Number of children: 2  ? Years of education: 59  ? Highest education level: Some college, no degree  ?Occupational History  ? Occupation: [employer]   ?Tobacco Use  ? Smoking status: Never  ? Smokeless tobacco: Never  ?Substance and Sexual Activity  ? Alcohol use: Yes  ?  Comment: rare  ? Drug use: Not on file  ? Sexual activity: Not on file  ?Other Topics Concern  ? Not on file  ?Social History Narrative  ? Lives with her husband. She has two children. She enjoys bowling in her free time.  ? ?Social Determinants of Health  ? ?Financial Resource Strain: Low Risk   ? Difficulty of Paying Living Expenses: Not hard at all  ?Food Insecurity: No Food Insecurity  ? Worried About Charity fundraiser in the Last Year: Never true  ? Ran Out of Food in the Last Year: Never true  ?Transportation Needs: No Transportation Needs  ? Lack of Transportation (Medical): No  ? Lack of Transportation (Non-Medical): No  ?Physical Activity: Inactive  ? Days of Exercise per Week: 0 days  ? Minutes of Exercise per Session: 0 min  ?Stress: No Stress Concern Present  ? Feeling of Stress : Not at all  ?Social Connections: Moderately Integrated  ? Frequency of Communication with Friends and Family: More than three  times a week  ? Frequency of Social Gatherings with Friends and Family: More than three times a week  ? Attends Religious Services: More than 4 times per year  ? Active Member of Clubs or Organizations: No  ? Attends Archivist Meetings: Never  ? Marital Status: Married  ?Intimate Partner Violence: Not At Risk  ? Fear of Current or Ex-Partner: No  ? Emotionally Abused: No  ? Physically Abused: No  ? Sexually Abused: No  ? ? ?Outpatient Medications Prior to Visit  ?Medication Sig Dispense Refill  ? atorvastatin (LIPITOR) 40 MG tablet TAKE 1 TABLET(40 MG) BY MOUTH DAILY 90 tablet 2  ? Calcium-Magnesium-Vitamin D (CALCIUM 1200+D3 PO) Take 1 tablet by mouth daily.    ? metoprolol (TOPROL-XL) 200 MG 24 hr tablet Take 1 tablet (200 mg total) by mouth daily. 90 tablet 3  ? levothyroxine (SYNTHROID) 75 MCG tablet Take 1 tablet (75 mcg total) by mouth daily before breakfast. 90  tablet 3  ? olmesartan-hydrochlorothiazide (BENICAR HCT) 40-12.5 MG tablet Take 1 tablet by mouth daily. 90 tablet 0  ? ?No facility-administered medications prior to visit.  ? ? ?No Known Allergies ? ?ROS ?Review of Systems ? ?  ?Objective:  ?  ?Physical Exam ?Constitutional:   ?   Appearance: Normal appearance. She is well-developed.  ?HENT:  ?   Head: Normocephalic and atraumatic.  ?Cardiovascular:  ?   Rate and Rhythm: Normal rate and regular rhythm.  ?   Heart sounds: Normal heart sounds.  ?Pulmonary:  ?   Effort: Pulmonary effort is normal.  ?   Breath sounds: Normal breath sounds.  ?Skin: ?   General: Skin is warm and dry.  ?Neurological:  ?   Mental Status: She is alert and oriented to person, place, and time.  ?Psychiatric:     ?   Behavior: Behavior normal.  ? ? ?BP 122/78   Pulse 79   Ht '5\' 1"'  (1.549 m)   Wt 202 lb (91.6 kg)   LMP  (LMP Unknown)   SpO2 97%   BMI 38.17 kg/m?  ?Wt Readings from Last 3 Encounters:  ?02/01/22 202 lb (91.6 kg)  ?09/29/21 205 lb (93 kg)  ?05/25/21 198 lb (89.8 kg)  ? ? ? ?Health Maintenance Due  ?Topic Date Due  ? COVID-19 Vaccine (4 - Booster) 05/05/2020  ? ? ?There are no preventive care reminders to display for this patient. ? ?Lab Results  ?Component Value Date  ? TSH 1.61 05/25/2021  ? ?Lab Results  ?Component Value Date  ? WBC 7.7 10/14/2021  ? HGB 14.5 10/14/2021  ? HCT 43.8 10/14/2021  ? MCV 93.8 10/14/2021  ? PLT 239 10/14/2021  ? ?Lab Results  ?Component Value Date  ? NA 138 11/06/2021  ? K 5.1 11/06/2021  ? CO2 28 11/06/2021  ? GLUCOSE 117 (H) 11/06/2021  ? BUN 24 11/06/2021  ? CREATININE 1.10 (H) 11/06/2021  ? BILITOT 0.4 10/14/2021  ? ALKPHOS 90 02/21/2017  ? AST 35 10/14/2021  ? ALT 40 (H) 10/14/2021  ? PROT 6.8 10/14/2021  ? ALBUMIN 4.1 02/21/2017  ? CALCIUM 10.0 11/06/2021  ? EGFR 55 (L) 11/06/2021  ? ?Lab Results  ?Component Value Date  ? CHOL 202 (H) 10/14/2021  ? ?Lab Results  ?Component Value Date  ? HDL 56 10/14/2021  ? ?Lab Results  ?Component Value  Date  ? LDLCALC 111 (H) 10/14/2021  ? ?Lab Results  ?Component Value Date  ? TRIG 233 (H) 10/14/2021  ? ?  Lab Results  ?Component Value Date  ? CHOLHDL 3.6 10/14/2021  ? ?Lab Results  ?Component Value Date  ? HGBA1C 6.1 (A) 02/01/2022  ? ? ?  ?Assessment & Plan:  ? ?Problem List Items Addressed This Visit   ? ?  ? Cardiovascular and Mediastinum  ? Essential hypertension  ?  Blood pressure looks great today.  We will continue with the Benicar HCT.  New prescription sent to mail order. ?  ?  ? Relevant Medications  ? olmesartan-hydrochlorothiazide (BENICAR HCT) 40-12.5 MG tablet  ?  ? Endocrine  ? Hypothyroidism  ?  Plan to recheck TSH in about 3 months.   ?  ?  ? Relevant Medications  ? levothyroxine (SYNTHROID) 75 MCG tablet  ? Controlled diabetes mellitus type 2 with complications ([place]) - Primary  ?  A1c looks great today at 6.1.  Continue current regimen she is really done a fantastic job.  Her A1c is down, blood pressure looks great and she is down 3 pounds on our scale but 6 pounds on home scale. ?  ?  ? Relevant Medications  ? olmesartan-hydrochlorothiazide (BENICAR HCT) 40-12.5 MG tablet  ? Other Relevant Orders  ? POCT glycosylated hemoglobin (Hb A1C) (Completed)  ? ? ?Meds ordered this encounter  ?Medications  ? olmesartan-hydrochlorothiazide (BENICAR HCT) 40-12.5 MG tablet  ?  Sig: Take 1 tablet by mouth daily.  ?  Dispense:  90 tablet  ?  Refill:  1  ?  Please d/c all prior Benicar Rx. Thank you  ? levothyroxine (SYNTHROID) 75 MCG tablet  ?  Sig: Take 1 tablet (75 mcg total) by mouth daily before breakfast.  ?  Dispense:  90 tablet  ?  Refill:  3  ? ? ?Follow-up: Return in about 15 weeks (around 05/17/2022) for Diabetes follow-up, Hypertension and labs .  ? ? ?Beatrice Lecher, MD ?

## 2022-02-16 DIAGNOSIS — L814 Other melanin hyperpigmentation: Secondary | ICD-10-CM | POA: Diagnosis not present

## 2022-02-16 DIAGNOSIS — L579 Skin changes due to chronic exposure to nonionizing radiation, unspecified: Secondary | ICD-10-CM | POA: Diagnosis not present

## 2022-02-16 DIAGNOSIS — L821 Other seborrheic keratosis: Secondary | ICD-10-CM | POA: Diagnosis not present

## 2022-02-16 DIAGNOSIS — D235 Other benign neoplasm of skin of trunk: Secondary | ICD-10-CM | POA: Diagnosis not present

## 2022-05-17 ENCOUNTER — Ambulatory Visit: Payer: Medicare Other | Admitting: Family Medicine

## 2022-05-19 ENCOUNTER — Ambulatory Visit: Payer: Medicare Other | Admitting: Family Medicine

## 2022-06-02 NOTE — Progress Notes (Unsigned)
   Established Patient Office Visit  Subjective   Patient ID: Emma Craig, female    DOB: Feb 26, 1951  Age: 71 y.o. MRN: [id number]  No chief complaint on file.   HPI   Diabetes - no hypoglycemic events. No wounds or sores that are not healing well. No increased thirst or urination. Checking glucose at home. Taking medications as prescribed without any side effects.  Hypertension- Pt denies chest pain, SOB, dizziness, or heart palpitations.  Taking meds as directed w/o problems.  Denies medication side effects.    {History (Optional):23778}  ROS    Objective:     LMP  (LMP Unknown)  {Vitals History (Optional):23777}  Physical Exam Vitals and nursing note reviewed.  Constitutional:      Appearance: She is well-developed.  HENT:     Head: Normocephalic and atraumatic.  Cardiovascular:     Rate and Rhythm: Normal rate and regular rhythm.     Heart sounds: Normal heart sounds.  Pulmonary:     Effort: Pulmonary effort is normal.     Breath sounds: Normal breath sounds.  Skin:    General: Skin is warm and dry.  Neurological:     Mental Status: She is alert and oriented to person, place, and time.  Psychiatric:        Behavior: Behavior normal.    No results found for any visits on 06/03/22.  {Labs (Optional):23779}  The 10-year ASCVD risk score (Arnett DK, et al., 2019) is: 17.2%    Assessment & Plan:   Problem List Items Addressed This Visit       Cardiovascular and Mediastinum   Essential hypertension - Primary     Endocrine   Controlled diabetes mellitus type 2 with complications ([place])    No follow-ups on file.    Beatrice Lecher, MD

## 2022-06-03 ENCOUNTER — Encounter: Payer: Self-pay | Admitting: Family Medicine

## 2022-06-03 ENCOUNTER — Ambulatory Visit (INDEPENDENT_AMBULATORY_CARE_PROVIDER_SITE_OTHER): Payer: Medicare Other | Admitting: Family Medicine

## 2022-06-03 VITALS — BP 134/78 | HR 74 | Ht 61.0 in | Wt 198.0 lb

## 2022-06-03 DIAGNOSIS — N1831 Chronic kidney disease, stage 3a: Secondary | ICD-10-CM | POA: Diagnosis not present

## 2022-06-03 DIAGNOSIS — I10 Essential (primary) hypertension: Secondary | ICD-10-CM

## 2022-06-03 DIAGNOSIS — E785 Hyperlipidemia, unspecified: Secondary | ICD-10-CM

## 2022-06-03 DIAGNOSIS — E118 Type 2 diabetes mellitus with unspecified complications: Secondary | ICD-10-CM | POA: Diagnosis not present

## 2022-06-03 DIAGNOSIS — Z6835 Body mass index (BMI) 35.0-35.9, adult: Secondary | ICD-10-CM

## 2022-06-03 DIAGNOSIS — E1169 Type 2 diabetes mellitus with other specified complication: Secondary | ICD-10-CM | POA: Diagnosis not present

## 2022-06-03 DIAGNOSIS — E038 Other specified hypothyroidism: Secondary | ICD-10-CM | POA: Diagnosis not present

## 2022-06-03 LAB — POCT GLYCOSYLATED HEMOGLOBIN (HGB A1C): Hemoglobin A1C: 6.4 % — AB (ref 4.0–5.6)

## 2022-06-03 MED ORDER — METOPROLOL SUCCINATE ER 200 MG PO TB24: 200.0000 mg | ORAL_TABLET | Freq: Every day | ORAL | 3 refills | Status: DC

## 2022-06-03 MED ORDER — OLMESARTAN MEDOXOMIL-HCTZ 40-12.5 MG PO TABS: 1.0000 | ORAL_TABLET | Freq: Every day | ORAL | 1 refills | Status: DC

## 2022-06-03 NOTE — Assessment & Plan Note (Signed)
Continue to follow every 6 months.

## 2022-06-03 NOTE — Assessment & Plan Note (Signed)
He is really doing fantastic.  Down 4 more pounds just gave her lots of encouragement to just continue to work on those healthy changes that she has made.  I am hopeful if she continues to lose weight we will hopefully be able to decrease her blood pressure medication.

## 2022-06-03 NOTE — Assessment & Plan Note (Signed)
Well controlled.  We discussed the HCTZ.  There is no evidence that it makes it more difficult to lose weight or causes weight gain.  I think she is doing well on this regimen.  She does have some urinary frequency at night occasionally so encouraged her to make sure she is taking her medication in the morning and not in the afternoon and that should help.  Continue current regimen. Follow up in  6 mo

## 2022-06-03 NOTE — Assessment & Plan Note (Signed)
Due to recheck TSH.  Its been a year since we last checked labs.  No specific concerns.

## 2022-06-03 NOTE — Assessment & Plan Note (Signed)
A1C went up just slightly to 6.4, up from 6.1.  She is otherwise doing well.  Just continue to monitor.  Follow-up in 3 to 4 months.  Lab Results  Component Value Date   HGBA1C 6.4 (A) 06/03/2022

## 2022-06-04 LAB — BASIC METABOLIC PANEL WITH GFR
BUN/Creatinine Ratio: 21 (calc) (ref 6–22)
BUN: 24 mg/dL (ref 7–25)
CO2: 29 mmol/L (ref 20–32)
Calcium: 9.6 mg/dL (ref 8.6–10.4)
Chloride: 102 mmol/L (ref 98–110)
Creat: 1.14 mg/dL — ABNORMAL HIGH (ref 0.50–1.05)
Glucose, Bld: 130 mg/dL — ABNORMAL HIGH (ref 65–99)
Potassium: 4.8 mmol/L (ref 3.5–5.3)
Sodium: 142 mmol/L (ref 135–146)
eGFR: 52 mL/min/{1.73_m2} — ABNORMAL LOW (ref >=60)

## 2022-06-04 LAB — MICROALBUMIN / CREATININE URINE RATIO
Creatinine, Urine: 92 mg/dL (ref 20–275)
Microalb Creat Ratio: 3 mcg/mg creat (ref <30)
Microalb, Ur: 0.3 mg/dL

## 2022-06-04 LAB — TSH: TSH: 1.25 mIU/L (ref 0.40–4.50)

## 2022-06-04 NOTE — Progress Notes (Signed)
Call patient: Kidney function is still slightly elevated but it has been stable at 1.1.  No excess protein in the urine which is good.  Thyroid is normal.

## 2022-08-16 ENCOUNTER — Ambulatory Visit (INDEPENDENT_AMBULATORY_CARE_PROVIDER_SITE_OTHER): Payer: Medicare Other | Admitting: Family Medicine

## 2022-08-16 DIAGNOSIS — Z0000 Encounter for general adult medical examination without abnormal findings: Secondary | ICD-10-CM | POA: Diagnosis not present

## 2022-08-16 DIAGNOSIS — Z1231 Encounter for screening mammogram for malignant neoplasm of breast: Secondary | ICD-10-CM

## 2022-08-16 NOTE — Progress Notes (Signed)
MEDICARE ANNUAL WELLNESS VISIT  08/16/2022  Telephone Visit Disclaimer This Medicare AWV was conducted by telephone due to national recommendations for restrictions regarding the COVID-19 Pandemic (e.g. social distancing).  I verified, using two identifiers, that I am speaking with Emma Craig or their authorized healthcare agent. I discussed the limitations, risks, security, and privacy concerns of performing an evaluation and management service by telephone and the potential availability of an in-person appointment in the future. The patient expressed understanding and agreed to proceed.  Location of Patient: Home Location of Provider (nurse):  In the office.  Subjective:    Emma Craig is a 71 y.o. female patient of Metheney, Rene Kocher, MD who had a Medicare Annual Wellness Visit today via telephone. Emma Craig is Retired and lives with their spouse. she has 2 children. she reports that she is socially active and does interact with friends/family regularly. she is minimally physically active and enjoys bowling.  Patient Care Team: Hali Marry, MD as PCP - General (Family Medicine)     08/16/2022    8:07 AM 08/14/2021    2:55 PM 05/01/2019    8:09 AM 07/27/2018    8:39 AM 04/06/2017    9:37 AM 03/12/2014    8:24 AM  Advanced Directives  Does Patient Have a Medical Advance Directive? Yes Yes Yes Yes No Patient does not have advance directive;Patient would like information  Type of Advance Directive Living will Living will;Healthcare Power of [place];Living will [place]    Does patient want to make changes to medical advance directive? No - Patient declined No - Patient declined      Copy of [place] in Chart?  No - copy requested No - copy requested No - copy requested    Would patient like information on creating a medical advance directive?     No - Patient declined Advance directive packet given    Hospital  Utilization Over the Past 12 Months: # of hospitalizations or ER visits: 0 # of surgeries: 0  Review of Systems    Patient reports that her overall health is unchanged compared to last year.  History obtained from chart review and the patient  Patient Reported Readings (BP, Pulse, CBG, Weight, etc) none  Pain Assessment Pain : No/denies pain     Current Medications & Allergies (verified) Allergies as of 08/16/2022   No Known Allergies      Medication List        Accurate as of August 16, 2022  8:26 AM. If you have any questions, ask your nurse or doctor.          atorvastatin 40 MG tablet Commonly known as: LIPITOR TAKE 1 TABLET(40 MG) BY MOUTH DAILY   CALCIUM 1200+D3 PO Take 1 tablet by mouth daily.   levothyroxine 75 MCG tablet Commonly known as: Synthroid Take 1 tablet (75 mcg total) by mouth daily before breakfast.   metoprolol 200 MG 24 hr tablet Commonly known as: TOPROL-XL Take 1 tablet (200 mg total) by mouth daily.   olmesartan-hydrochlorothiazide 40-12.5 MG tablet Commonly known as: Benicar HCT Take 1 tablet by mouth daily.        History (reviewed): Past Medical History:  Diagnosis Date   Chronic kidney disease, stage 2, mildly decreased GFR    Diabetes mellitus    Heavy menses    Hyperlipidemia    Hypertension    Obesity    Past Surgical History:  Procedure  Laterality Date   CATARACT EXTRACTION  1-09   right    CATARACT EXTRACTION  09/2011   left    CHOLECYSTECTOMY     laproscopic   rt elbow screws placed  10-82   Family History  Problem Relation Age of Onset   Cancer Father 83       colon and prostate CA   Cancer Mother 68       carcinoid tumor/with mets to brain   Hypertension Sister    Hyperlipidemia Sister    Hypertension Brother    Hypertension Brother    Hypertension Sister    Hyperlipidemia Sister    Hypertension Sister    Hyperlipidemia Sister    Hypertension Sister    Hyperlipidemia Sister    Lymphoma  Sister        non-hodgkins    Social History   Socioeconomic History   Marital status: Married    Spouse name: Tommy   Number of children: 2   Years of education: 13   Highest education level: Some college, no degree  Occupational History   Occupation: [employer]  Tobacco Use   Smoking status: Never   Smokeless tobacco: Never  Vaping Use   Vaping Use: Never used  Substance and Sexual Activity   Alcohol use: Yes    Comment: rare   Drug use: Not on file   Sexual activity: Not on file  Other Topics Concern   Not on file  Social History Narrative   Lives with her husband. She has two children. She enjoys bowling in her free time.   Social Determinants of Health   Financial Resource Strain: Low Risk  (08/16/2022)   Overall Financial Resource Strain (CARDIA)    Difficulty of Paying Living Expenses: Not hard at all  Food Insecurity: No Food Insecurity (08/16/2022)   Hunger Vital Sign    Worried About Running Out of Food in the Last Year: Never true    Ran Out of Food in the Last Year: Never true  Transportation Needs: No Transportation Needs (08/16/2022)   PRAPARE - Hydrologist (Medical): No    Lack of Transportation (Non-Medical): No  Physical Activity: Inactive (08/16/2022)   Exercise Vital Sign    Days of Exercise per Week: 0 days    Minutes of Exercise per Session: 0 min  Stress: No Stress Concern Present (08/16/2022)   [place]    Feeling of Stress : Not at all  Social Connections: Moderately Integrated (08/16/2022)   Social Connection and Isolation Panel [NHANES]    Frequency of Communication with Friends and Family: More than three times a week    Frequency of Social Gatherings with Friends and Family: Twice a week    Attends Religious Services: More than 4 times per year    Active Member of [employer] or Organizations: No    Attends Archivist Meetings: Never    Marital  Status: Married    Activities of Daily Living    08/16/2022    8:12 AM  In your present state of health, do you have any difficulty performing the following activities:  Hearing? 0  Vision? 0  Difficulty concentrating or making decisions? 0  Walking or climbing stairs? 0  Dressing or bathing? 0  Doing errands, shopping? 0  Preparing Food and eating ? N  Using the Toilet? N  In the past six months, have you accidently leaked urine? N  Do  you have problems with loss of bowel control? N  Managing your Medications? N  Managing your Finances? N  Housekeeping or managing your Housekeeping? N    Patient Education/ Literacy How often do you need to have someone help you when you read instructions, pamphlets, or other written materials from your doctor or pharmacy?: 1 - Never What is the last grade level you completed in school?: 12th grade and some college  Exercise Current Exercise Habits: Home exercise routine, Type of exercise: Other - see comments (water aerobics), Time (Minutes): 60, Frequency (Times/Week): 2, Weekly Exercise (Minutes/Week): 120, Intensity: Mild, Exercise limited by: None identified  Diet Patient reports consuming  2-3  meals a day and 0 snack(s) a day Patient reports that her primary diet is: Regular Patient reports that she does have regular access to food.   Depression Screen    08/16/2022    8:08 AM 06/03/2022    8:13 AM 02/01/2022    7:19 AM 09/29/2021    8:34 AM 08/14/2021    2:56 PM 02/16/2021    7:33 AM 10/13/2020    7:19 AM  PHQ 2/9 Scores  PHQ - 2 Score 0 0 0 0 0 0 0     Fall Risk    08/16/2022    8:08 AM 06/03/2022    8:13 AM 02/01/2022    7:19 AM 09/29/2021    8:34 AM 08/14/2021    2:56 PM  Fall Risk   Falls in the past year? 0 0 0 0 0  Number falls in past yr: 0 0 0 0 0  Injury with Fall? 0 0 0 0 0  Risk for fall due to : No Fall Risks No Fall Risks No Fall Risks  No Fall Risks  Follow up Falls evaluation completed Falls prevention discussed  Falls prevention discussed Falls evaluation completed Falls evaluation completed     Objective:  Emma Craig seemed alert and oriented and she participated appropriately during our telephone visit.  Blood Pressure Weight BMI  BP Readings from Last 3 Encounters:  06/03/22 134/78  02/01/22 122/78  11/02/21 (!) 143/60   Wt Readings from Last 3 Encounters:  06/03/22 198 lb (89.8 kg)  02/01/22 202 lb (91.6 kg)  09/29/21 205 lb (93 kg)   BMI Readings from Last 1 Encounters:  06/03/22 37.41 kg/m    *Unable to obtain current vital signs, weight, and BMI due to telephone visit type  Hearing/Vision  Emma Craig did not seem to have difficulty with hearing/understanding during the telephone conversation Reports that she has had a formal eye exam by an eye care professional within the past year Reports that she has not had a formal hearing evaluation within the past year *Unable to fully assess hearing and vision during telephone visit type  Cognitive Function:    08/16/2022    8:14 AM 08/14/2021    3:02 PM 07/27/2018    8:40 AM  6CIT Screen  What Year? 0 points 0 points 0 points  What month? 0 points 0 points 0 points  What time? 0 points 0 points 0 points  Count back from 20 0 points 0 points 0 points  Months in reverse 0 points 0 points 0 points  Repeat phrase 0 points 0 points 6 points  Total Score 0 points 0 points 6 points   (Normal:0-7, Significant for Dysfunction: >8)  Normal Cognitive Function Screening: Yes   Immunization & Health Maintenance Record Immunization History  Administered Date(s) Administered   Moderna Sars-Covid-2 Vaccination  01/28/2020, 02/24/2020, 03/10/2020   Td 02/14/2009   Tdap 12/02/2019   Zoster Recombinat (Shingrix) 05/24/2018, 07/27/2018   Zoster, Live 01/10/2015    Health Maintenance  Topic Date Due   COVID-19 Vaccine (4 - Moderna series) 09/01/2022 (Originally 05/05/2020)   Pneumonia Vaccine 98+ Years old (1 - PCV) 09/29/2022 (Originally  07/16/2018)   INFLUENZA VACCINE  11/29/2023 (Originally 06/29/2022)   OPHTHALMOLOGY EXAM  09/11/2022   COLONOSCOPY (Pts 45-23yr Insurance coverage will need to be confirmed)  11/12/2022   HEMOGLOBIN A1C  12/04/2022   FOOT EXAM  02/02/2023   Diabetic kidney evaluation - GFR measurement  06/04/2023   Diabetic kidney evaluation - Urine ACR  06/04/2023   MAMMOGRAM  09/24/2023   DEXA SCAN  09/24/2023   TETANUS/TDAP  12/01/2029   Hepatitis C Screening  Completed   Zoster Vaccines- Shingrix  Completed   HPV VACCINES  Aged Out       Assessment  This is a routine wellness examination for Emma Craig  Health Maintenance: Due or Overdue There are no preventive care reminders to display for this patient.   Emma Tinkledoes not need a referral for Community Assistance: Care Management:   no Social Work:    no Prescription Assistance:  no Nutrition/Diabetes Education:  no   Plan:  Personalized Goals  Goals Addressed               This Visit's Progress     Patient Stated (pt-stated)        She states she would like to loose 50 lbs.       Personalized Health Maintenance & Screening Recommendations  Pneumococcal vaccine  Influenza vaccine Mammogram - due in October Colonoscopy - due in December Eye exam- due in October  Patient declined pneumonia and influenza vaccine.  Lung Cancer Screening Recommended: no (Low Dose CT Chest recommended if Age 69-80years, 30 pack-year currently smoking OR have quit w/in past 15 years) Hepatitis C Screening recommended: no HIV Screening recommended: no  Advanced Directives: Written information was not prepared per patient's request.  Referrals & Orders Orders Placed This Encounter  Procedures   Mammogram 3D SCREEN BREAST BILATERAL    Follow-up Plan Follow-up with MHali Marry MD as planned Schedule your colonoscopy for December and mammogram for October.  Medicare wellness visit in one year. AVS printed and mailed.   I  have personally reviewed and noted the following in the patient's chart:   Medical and social history Use of alcohol, tobacco or illicit drugs  Current medications and supplements Functional ability and status Nutritional status Physical activity Advanced directives List of other physicians Hospitalizations, surgeries, and ER visits in previous 12 months Vitals Screenings to include cognitive, depression, and falls Referrals and appointments  In addition, I have reviewed and discussed with Emma Farbercertain preventive protocols, quality metrics, and best practice recommendations. A written personalized care plan for preventive services as well as general preventive health recommendations is available and can be mailed to the patient at her request.      BTinnie Gens RN BSN  08/16/2022

## 2022-08-16 NOTE — Patient Instructions (Addendum)
[place] Maintenance Summary and Written Plan of Care  Ms. Emma Craig ,  Thank you for allowing me to perform your Medicare Annual Wellness Visit and for your ongoing commitment to your health.   Health Maintenance & Immunization History Health Maintenance  Topic Date Due   COVID-19 Vaccine (4 - Moderna series) 09/01/2022 (Originally 05/05/2020)   Pneumonia Vaccine 37+ Years old (1 - PCV) 09/29/2022 (Originally 07/16/2018)   INFLUENZA VACCINE  11/29/2023 (Originally 06/29/2022)   OPHTHALMOLOGY EXAM  09/11/2022   COLONOSCOPY (Pts 45-34yr Insurance coverage will need to be confirmed)  11/12/2022   HEMOGLOBIN A1C  12/04/2022   FOOT EXAM  02/02/2023   Diabetic kidney evaluation - GFR measurement  06/04/2023   Diabetic kidney evaluation - Urine ACR  06/04/2023   MAMMOGRAM  09/24/2023   DEXA SCAN  09/24/2023   TETANUS/TDAP  12/01/2029   Hepatitis C Screening  Completed   Zoster Vaccines- Shingrix  Completed   HPV VACCINES  Aged Out   Immunization History  Administered Date(s) Administered   Moderna Sars-Covid-2 Vaccination 01/28/2020, 02/24/2020, 03/10/2020   Td 02/14/2009   Tdap 12/02/2019   Zoster Recombinat (Shingrix) 05/24/2018, 07/27/2018   Zoster, Live 01/10/2015    These are the patient goals that we discussed:  Goals Addressed               This Visit's Progress     Patient Stated (pt-stated)        She states she would like to loose 50 lbs.         This is a list of Health Maintenance Items that are overdue or due now: Pneumococcal vaccine  Influenza vaccine Mammogram - due in October Colonoscopy - due in December Eye exam- due in October  Patient declined pneumonia and influenza vaccine.  Orders/Referrals Placed Today: Orders Placed This Encounter  Procedures   Mammogram 3D SCREEN BREAST BILATERAL    Standing Status:   Future    Standing Expiration Date:   08/17/2023    Scheduling Instructions:     Please call patient to  schedule.    Order Specific Question:   Reason for Exam (SYMPTOM  OR DIAGNOSIS REQUIRED)    Answer:   breast cancer screening    Order Specific Question:   Preferred imaging location?    Answer:   [facility]   (Contact our referral department at [phone] you have not spoken with someone about your referral appointment within the next 5 days)    Follow-up Plan Follow-up with MHali Marry MD as planned Schedule your colonoscopy for December and mammogram for October.  Medicare wellness visit in one year. AVS printed and mailed.      Health Maintenance, Female Adopting a healthy lifestyle and getting preventive care are important in promoting health and wellness. Ask your health care provider about: The right schedule for you to have regular tests and exams. Things you can do on your own to prevent diseases and keep yourself healthy. What should I know about diet, weight, and exercise? Eat a healthy diet  Eat a diet that includes plenty of vegetables, fruits, low-fat dairy products, and lean protein. Do not eat a lot of foods that are high in solid fats, added sugars, or sodium. Maintain a healthy weight Body mass index (BMI) is used to identify weight problems. It estimates body fat based on height and weight. Your health care provider can help determine your BMI and help you achieve or maintain a  healthy weight. Get regular exercise Get regular exercise. This is one of the most important things you can do for your health. Most adults should: Exercise for at least 150 minutes each week. The exercise should increase your heart rate and make you sweat (moderate-intensity exercise). Do strengthening exercises at least twice a week. This is in addition to the moderate-intensity exercise. Spend less time sitting. Even light physical activity can be beneficial. Watch cholesterol and blood lipids Have your blood tested for lipids and cholesterol at 69 years of  age, then have this test every 5 years. Have your cholesterol levels checked more often if: Your lipid or cholesterol levels are high. You are older than 69 years of age. You are at high risk for heart disease. What should I know about cancer screening? Depending on your health history and family history, you may need to have cancer screening at various ages. This may include screening for: Breast cancer. Cervical cancer. Colorectal cancer. Skin cancer. Lung cancer. What should I know about heart disease, diabetes, and high blood pressure? Blood pressure and heart disease High blood pressure causes heart disease and increases the risk of stroke. This is more likely to develop in people who have high blood pressure readings or are overweight. Have your blood pressure checked: Every 3-5 years if you are 22-23 years of age. Every year if you are 34 years old or older. Diabetes Have regular diabetes screenings. This checks your fasting blood sugar level. Have the screening done: Once every three years after age 50 if you are at a normal weight and have a low risk for diabetes. More often and at a younger age if you are overweight or have a high risk for diabetes. What should I know about preventing infection? Hepatitis B If you have a higher risk for hepatitis B, you should be screened for this virus. Talk with your health care provider to find out if you are at risk for hepatitis B infection. Hepatitis C Testing is recommended for: Everyone born from [birth year] through 1963. Anyone with known risk factors for hepatitis C. Sexually transmitted infections (STIs) Get screened for STIs, including gonorrhea and chlamydia, if: You are sexually active and are younger than 69 years of age. You are older than 69 years of age and your health care provider tells you that you are at risk for this type of infection. Your sexual activity has changed since you were last screened, and you are at increased  risk for chlamydia or gonorrhea. Ask your health care provider if you are at risk. Ask your health care provider about whether you are at high risk for HIV. Your health care provider may recommend a prescription medicine to help prevent HIV infection. If you choose to take medicine to prevent HIV, you should first get tested for HIV. You should then be tested every 3 months for as long as you are taking the medicine. Pregnancy If you are about to stop having your period (premenopausal) and you may become pregnant, seek counseling before you get pregnant. Take 400 to 800 micrograms (mcg) of folic acid every day if you become pregnant. Ask for birth control (contraception) if you want to prevent pregnancy. Osteoporosis and menopause Osteoporosis is a disease in which the bones lose minerals and strength with aging. This can result in bone fractures. If you are 39 years old or older, or if you are at risk for osteoporosis and fractures, ask your health care provider if you should: Be screened  for bone loss. Take a calcium or vitamin D supplement to lower your risk of fractures. Be given hormone replacement therapy (HRT) to treat symptoms of menopause. Follow these instructions at home: Alcohol use Do not drink alcohol if: Your health care provider tells you not to drink. You are pregnant, may be pregnant, or are planning to become pregnant. If you drink alcohol: Limit how much you have to: 0-1 drink a day. Know how much alcohol is in your drink. In the [place] one drink equals one 12 oz bottle of beer (355 mL), one 5 oz glass of wine (148 mL), or one 1 oz glass of hard liquor (44 mL). Lifestyle Do not use any products that contain nicotine or tobacco. These products include cigarettes, chewing tobacco, and vaping devices, such as e-cigarettes. If you need help quitting, ask your health care provider. Do not use street drugs. Do not share needles. Ask your health care provider for help if you need  support or information about quitting drugs. General instructions Schedule regular health, dental, and eye exams. Stay current with your vaccines. Tell your health care provider if: You often feel depressed. You have ever been abused or do not feel safe at home. Summary Adopting a healthy lifestyle and getting preventive care are important in promoting health and wellness. Follow your health care provider's instructions about healthy diet, exercising, and getting tested or screened for diseases. Follow your health care provider's instructions on monitoring your cholesterol and blood pressure. This information is not intended to replace advice given to you by your health care provider. Make sure you discuss any questions you have with your health care provider. Document Revised: 04/06/2021 Document Reviewed: 04/06/2021 Elsevier Patient Education  [place].

## 2022-10-13 ENCOUNTER — Ambulatory Visit (INDEPENDENT_AMBULATORY_CARE_PROVIDER_SITE_OTHER): Payer: Medicare Other

## 2022-10-13 DIAGNOSIS — Z1231 Encounter for screening mammogram for malignant neoplasm of breast: Secondary | ICD-10-CM

## 2022-10-13 DIAGNOSIS — Z0000 Encounter for general adult medical examination without abnormal findings: Secondary | ICD-10-CM

## 2022-10-14 NOTE — Progress Notes (Signed)
Please call patient. Normal mammogram.  Repeat in 1 year.  

## 2022-11-29 ENCOUNTER — Other Ambulatory Visit: Payer: Self-pay | Admitting: Family Medicine

## 2022-12-06 ENCOUNTER — Encounter: Payer: Self-pay | Admitting: Family Medicine

## 2022-12-06 ENCOUNTER — Ambulatory Visit (INDEPENDENT_AMBULATORY_CARE_PROVIDER_SITE_OTHER): Payer: Medicare Other | Admitting: Family Medicine

## 2022-12-06 VITALS — BP 136/74 | HR 76 | Ht 61.0 in | Wt 198.0 lb

## 2022-12-06 DIAGNOSIS — N1831 Chronic kidney disease, stage 3a: Secondary | ICD-10-CM | POA: Diagnosis not present

## 2022-12-06 DIAGNOSIS — E118 Type 2 diabetes mellitus with unspecified complications: Secondary | ICD-10-CM | POA: Diagnosis not present

## 2022-12-06 DIAGNOSIS — E038 Other specified hypothyroidism: Secondary | ICD-10-CM

## 2022-12-06 DIAGNOSIS — I10 Essential (primary) hypertension: Secondary | ICD-10-CM | POA: Diagnosis not present

## 2022-12-06 DIAGNOSIS — Z1211 Encounter for screening for malignant neoplasm of colon: Secondary | ICD-10-CM

## 2022-12-06 LAB — POCT GLYCOSYLATED HEMOGLOBIN (HGB A1C): Hemoglobin A1C: 7.3 % — AB (ref 4.0–5.6)

## 2022-12-06 MED ORDER — LEVOTHYROXINE SODIUM 75 MCG PO TABS: 75.0000 ug | ORAL_TABLET | Freq: Every day | ORAL | 3 refills | Status: DC

## 2022-12-06 MED ORDER — OLMESARTAN MEDOXOMIL-HCTZ 40-12.5 MG PO TABS: 1.0000 | ORAL_TABLET | Freq: Every day | ORAL | 3 refills | Status: DC

## 2022-12-06 MED ORDER — ROSUVASTATIN CALCIUM 20 MG PO TABS: 20.0000 mg | ORAL_TABLET | Freq: Every day | ORAL | 3 refills | Status: DC

## 2022-12-06 NOTE — Progress Notes (Signed)
Established Patient Office Visit  Subjective   Patient ID: Emma Craig, female    DOB: 1951-06-06  Age: 72 y.o. MRN: [id number]  Chief Complaint  Patient presents with   Diabetes    HPI  Hypertension- Pt denies chest pain, SOB, dizziness, or heart palpitations.  Taking meds as directed w/o problems.  Denies medication side effects.    Hypothyroidism - Taking medication regularly in the AM away from food and vitamins, etc. No recent change to skin, hair, or energy levels.  Diabetes - no hypoglycemic events. No wounds or sores that are not healing well. No increased thirst or urination. Checking glucose at home. Taking medications as prescribed without any side effects.     ROS    Objective:     BP 136/74   Pulse 76   Ht '5\' 1"'$  (1.549 m)   Wt 198 lb (89.8 kg)   LMP  (LMP Unknown)   SpO2 97%   BMI 37.41 kg/m    Physical Exam Vitals and nursing note reviewed.  Constitutional:      Appearance: She is well-developed.  HENT:     Head: Normocephalic and atraumatic.  Cardiovascular:     Rate and Rhythm: Normal rate and regular rhythm.     Heart sounds: Normal heart sounds.  Pulmonary:     Effort: Pulmonary effort is normal.     Breath sounds: Normal breath sounds.  Skin:    General: Skin is warm and dry.  Neurological:     Mental Status: She is alert and oriented to person, place, and time.  Psychiatric:        Behavior: Behavior normal.      Results for orders placed or performed in visit on 12/06/22  POCT glycosylated hemoglobin (Hb A1C)  Result Value Ref Range   Hemoglobin A1C 7.3 (A) 4.0 - 5.6 %   HbA1c POC (<> result, manual entry)     HbA1c, POC (prediabetic range)     HbA1c, POC (controlled diabetic range)        The 10-year ASCVD risk score (Arnett DK, et al., 2019) is: 23.2%    Assessment & Plan:   Problem List Items Addressed This Visit       Cardiovascular and Mediastinum   Essential hypertension - Primary    Blood pressure is a little  borderline today we will continue to keep an eye on it continue Benicar HCT.  If still elevated when I see her back we can always increase the HCTZ component of the medication.      Relevant Medications   rosuvastatin (CRESTOR) 20 MG tablet   olmesartan-hydrochlorothiazide (BENICAR HCT) 40-12.5 MG tablet   Other Relevant Orders   TSH   COMPLETE METABOLIC PANEL WITH GFR   Lipid Panel w/reflex Direct LDL     Endocrine   Hypothyroidism    Due to recheck TSH.      Relevant Medications   levothyroxine (SYNTHROID) 75 MCG tablet   Controlled diabetes mellitus type 2 with complications (HCC)    O7S jumped up to 7.3 today.  It looked great last time at 6.4.  We discussed options.  She has been doing Nutrisystem for the last month and really feels like it may be driving up her blood sugars.  We discussed following back up in 3 months to make sure that blood glucose levels are improving.  If they are not then we will need to start medication at that time.  She is also on a statin  and an ARB.  Lab Results  Component Value Date   HGBA1C 7.3 (A) 12/06/2022        Relevant Medications   rosuvastatin (CRESTOR) 20 MG tablet   olmesartan-hydrochlorothiazide (BENICAR HCT) 40-12.5 MG tablet   Other Relevant Orders   TSH   COMPLETE METABOLIC PANEL WITH GFR   Lipid Panel w/reflex Direct LDL   POCT glycosylated hemoglobin (Hb A1C) (Completed)     Genitourinary   CKD (chronic kidney disease) stage 3, GFR 30-59 ml/min (HCC)   Relevant Orders   TSH   COMPLETE METABOLIC PANEL WITH GFR   Lipid Panel w/reflex Direct LDL   Other Visit Diagnoses     Screen for colon cancer       Relevant Orders   Ambulatory referral to Gastroenterology      Her chart says that she is due for 3-year recall for her colonoscopy but she says she has not heard from [facility] [place].  Will go ahead and place a referral.  Return in about 3 months (around 03/07/2023) for Diabetes follow-up.     Beatrice Lecher, MD

## 2022-12-06 NOTE — Assessment & Plan Note (Signed)
A1c jumped up to 7.3 today.  It looked great last time at 6.4.  We discussed options.  She has been doing Nutrisystem for the last month and really feels like it may be driving up her blood sugars.  We discussed following back up in 3 months to make sure that blood glucose levels are improving.  If they are not then we will need to start medication at that time.  She is also on a statin and an ARB.  Lab Results  Component Value Date   HGBA1C 7.3 (A) 12/06/2022

## 2022-12-06 NOTE — Assessment & Plan Note (Signed)
Due to recheck TSH. 

## 2022-12-06 NOTE — Assessment & Plan Note (Signed)
Blood pressure is a little borderline today we will continue to keep an eye on it continue Benicar HCT.  If still elevated when I see her back we can always increase the HCTZ component of the medication.

## 2022-12-29 DIAGNOSIS — N1831 Chronic kidney disease, stage 3a: Secondary | ICD-10-CM | POA: Diagnosis not present

## 2022-12-29 DIAGNOSIS — I10 Essential (primary) hypertension: Secondary | ICD-10-CM | POA: Diagnosis not present

## 2022-12-29 DIAGNOSIS — E118 Type 2 diabetes mellitus with unspecified complications: Secondary | ICD-10-CM | POA: Diagnosis not present

## 2022-12-30 LAB — COMPLETE METABOLIC PANEL WITH GFR
AG Ratio: 1.7 (calc) (ref 1.0–2.5)
ALT: 30 U/L — ABNORMAL HIGH (ref 6–29)
AST: 27 U/L (ref 10–35)
Albumin: 4.2 g/dL (ref 3.6–5.1)
Alkaline phosphatase (APISO): 66 U/L (ref 37–153)
BUN/Creatinine Ratio: 17 (calc) (ref 6–22)
BUN: 19 mg/dL (ref 7–25)
CO2: 28 mmol/L (ref 20–32)
Calcium: 9.2 mg/dL (ref 8.6–10.4)
Chloride: 101 mmol/L (ref 98–110)
Creat: 1.12 mg/dL — ABNORMAL HIGH (ref 0.50–1.05)
Globulin: 2.5 g/dL (calc) (ref 1.9–3.7)
Glucose, Bld: 153 mg/dL — ABNORMAL HIGH (ref 65–99)
Potassium: 4.2 mmol/L (ref 3.5–5.3)
Sodium: 139 mmol/L (ref 135–146)
Total Bilirubin: 0.3 mg/dL (ref 0.2–1.2)
Total Protein: 6.7 g/dL (ref 6.1–8.1)
eGFR: 53 mL/min/{1.73_m2} — ABNORMAL LOW (ref >=60)

## 2022-12-30 LAB — LIPID PANEL W/REFLEX DIRECT LDL
Cholesterol: 181 mg/dL (ref <200)
HDL: 51 mg/dL (ref >=50)
LDL Cholesterol (Calc): 90 mg/dL (calc)
Non-HDL Cholesterol (Calc): 130 mg/dL (calc) — ABNORMAL HIGH (ref <130)
Total CHOL/HDL Ratio: 3.5 (calc) (ref <5.0)
Triglycerides: 279 mg/dL — ABNORMAL HIGH (ref <150)

## 2022-12-30 LAB — TSH: TSH: 1.79 mIU/L (ref 0.40–4.50)

## 2022-12-30 NOTE — Progress Notes (Signed)
Call patient: Kidney function is stable.  ALT liver enzyme still slightly elevated but better than it has been in the past.  Cholesterol looks better this time, great work!.  Thyroid looks great.

## 2023-01-04 ENCOUNTER — Telehealth: Payer: Self-pay | Admitting: Family Medicine

## 2023-01-04 MED ORDER — LEVOTHYROXINE SODIUM 150 MCG PO TABS: 75.0000 ug | ORAL_TABLET | Freq: Every day | ORAL | 1 refills | Status: DC

## 2023-01-04 NOTE — Telephone Encounter (Signed)
Call pt and let her know that her mailorder doesn't have thyroid 75 mcg in stock so they do have the 161mg so she will have to split them in half.   Meds ordered this encounter  Medications   levothyroxine (SYNTHROID) 150 MCG tablet    Sig: Take 0.5 tablets (75 mcg total) by mouth daily before breakfast.    Dispense:  45 tablet    Refill:  1

## 2023-01-05 NOTE — Telephone Encounter (Signed)
Patient advised.

## 2023-01-06 ENCOUNTER — Encounter: Payer: Self-pay | Admitting: Family Medicine

## 2023-01-06 DIAGNOSIS — Z86010 Personal history of colonic polyps: Secondary | ICD-10-CM | POA: Diagnosis not present

## 2023-01-06 DIAGNOSIS — Z09 Encounter for follow-up examination after completed treatment for conditions other than malignant neoplasm: Secondary | ICD-10-CM | POA: Diagnosis not present

## 2023-01-06 DIAGNOSIS — D123 Benign neoplasm of transverse colon: Secondary | ICD-10-CM | POA: Diagnosis not present

## 2023-01-06 LAB — HM COLONOSCOPY

## 2023-01-07 NOTE — Progress Notes (Unsigned)
Abstracted colonoscopy.

## 2023-01-13 ENCOUNTER — Encounter: Payer: Self-pay | Admitting: Family Medicine

## 2023-01-13 ENCOUNTER — Ambulatory Visit (INDEPENDENT_AMBULATORY_CARE_PROVIDER_SITE_OTHER): Payer: Medicare Other | Admitting: Family Medicine

## 2023-01-13 VITALS — BP 140/64 | HR 63 | Ht 61.0 in | Wt 198.1 lb

## 2023-01-13 DIAGNOSIS — L089 Local infection of the skin and subcutaneous tissue, unspecified: Secondary | ICD-10-CM | POA: Diagnosis not present

## 2023-01-13 DIAGNOSIS — S0031XA Abrasion of nose, initial encounter: Secondary | ICD-10-CM | POA: Diagnosis not present

## 2023-01-13 DIAGNOSIS — H8111 Benign paroxysmal vertigo, right ear: Secondary | ICD-10-CM | POA: Diagnosis not present

## 2023-01-13 MED ORDER — MUPIROCIN 2 % EX OINT: TOPICAL_OINTMENT | CUTANEOUS | 0 refills | Status: DC

## 2023-01-13 NOTE — Progress Notes (Signed)
Established Patient Office Visit  Subjective   Patient ID: Emma Craig, female    DOB: Apr 10, 1951  Age: 72 y.o. MRN: [id number]  Chief Complaint  Patient presents with   Dizziness   sore in nose    Clear liquid dripping from nose   Headache    HPI  Here today for dizziness.  She reports that it started after she had her colonoscopy.  In fact after she woke up she noticed that she was having soreness in her nose they placed oxygen with a nasal cannula on her.  She says the nose was clear runny for several days.  She says that finally improved and the soreness on the left side improved but still very tender to touch on the right side.  No blood or discharge.  She is also been having headaches daily since then mostly starting in the back of her head but occasionally on the left side as well  And she is also felt dizzy particularly with bending over, extending her head, and rolling over in bed.        ROS    Objective:     BP (!) 147/61 (BP Location: Left Arm, Patient Position: Sitting, Cuff Size: Normal)   Pulse 70   Ht 5' 1"$  (1.549 m)   Wt 198 lb 1.3 oz (89.8 kg)   LMP  (LMP Unknown)   SpO2 98%   BMI 37.43 kg/m    Physical Exam Constitutional:      Appearance: She is well-developed.  HENT:     Head: Normocephalic and atraumatic.     Right Ear: Tympanic membrane, ear canal and external ear normal.     Left Ear: Tympanic membrane, ear canal and external ear normal.     Nose: Nose normal.     Mouth/Throat:     Mouth: Mucous membranes are moist.  Eyes:     Conjunctiva/sclera: Conjunctivae normal.     Pupils: Pupils are equal, round, and reactive to light.  Neck:     Thyroid: No thyromegaly.  Cardiovascular:     Rate and Rhythm: Normal rate and regular rhythm.     Heart sounds: Normal heart sounds.  Pulmonary:     Effort: Pulmonary effort is normal.     Breath sounds: Normal breath sounds. No wheezing.  Musculoskeletal:     Cervical back: Neck supple.   Lymphadenopathy:     Cervical: No cervical adenopathy.  Skin:    General: Skin is warm and dry.  Neurological:     Mental Status: She is alert and oriented to person, place, and time.     Comments: Positive Dix-Hallpike maneuver to the right.      No results found for any visits on 01/13/23.    The 10-year ASCVD risk score (Arnett DK, et al., 2019) is: 26.2%    Assessment & Plan:   Problem List Items Addressed This Visit   None Visit Diagnoses     BPPV (benign paroxysmal positional vertigo), right    -  Primary   Abrasion of nose with infection, initial encounter       Relevant Medications   mupirocin ointment (BACTROBAN) 2 %       BPPV, right-given handout for exercises to do on her own at home.  If not improving encouraged her to give [place] a call back so we can refer her for vestibular rehab.  Nose pain - unable to visualize an abrasion inside of the nose but since it still tender  to touch organ to do a trial of mupirocin ointment over the next week to see if that improves.  I will  Return if symptoms worsen or fail to improve.    Beatrice Lecher, MD

## 2023-02-03 ENCOUNTER — Ambulatory Visit (INDEPENDENT_AMBULATORY_CARE_PROVIDER_SITE_OTHER): Payer: Medicare Other | Admitting: Family Medicine

## 2023-02-03 VITALS — BP 141/59 | HR 68 | Ht 61.0 in | Wt 198.1 lb

## 2023-02-03 DIAGNOSIS — J019 Acute sinusitis, unspecified: Secondary | ICD-10-CM

## 2023-02-03 DIAGNOSIS — J3489 Other specified disorders of nose and nasal sinuses: Secondary | ICD-10-CM | POA: Diagnosis not present

## 2023-02-03 MED ORDER — AMOXICILLIN-POT CLAVULANATE 875-125 MG PO TABS: 1.0000 | ORAL_TABLET | Freq: Two times a day (BID) | ORAL | 0 refills | Status: DC

## 2023-02-03 NOTE — Progress Notes (Signed)
Acute Office Visit  Subjective:     Patient ID: Emma Craig, female    DOB: February 17, 1951, 72 y.o.   MRN: [id number]  Chief Complaint  Patient presents with   Sinus Problem    HPI Patient is in today for sinus sxs.  When I saw her about 3 weeks ago she had some pain in the right side of her nose she had a nasal cannula in and felt like maybe there had been an abrasion or an injury to the tissue it was tender to touch.  I have prescribed mupirocin ointment but she said it burned when she tried to use it so she stopped it.  She felt the symptoms were getting better until about 3 days ago when she started to have increased pain and tenderness on the right side of the nose into the maxillary cheek on the right side and into even the right frontal sinus and towards her ear.  She is mostly had clear drainage it has not changed in color or thickness.  No fever or chills.  ROS      Objective:    BP (!) 141/59 (BP Location: Left Arm, Patient Position: Sitting, Cuff Size: Normal)   Pulse 68   Ht '5\' 1"'$  (1.549 m)   Wt 198 lb 1.9 oz (89.9 kg)   LMP  (LMP Unknown)   SpO2 98%   BMI 37.43 kg/m    Physical Exam Constitutional:      Appearance: She is well-developed.  HENT:     Head: Normocephalic and atraumatic.     Right Ear: Tympanic membrane, ear canal and external ear normal.     Left Ear: Tympanic membrane, ear canal and external ear normal.     Nose: Nose normal.     Mouth/Throat:     Pharynx: Oropharynx is clear.  Eyes:     Conjunctiva/sclera: Conjunctivae normal.     Pupils: Pupils are equal, round, and reactive to light.  Neck:     Thyroid: No thyromegaly.  Cardiovascular:     Rate and Rhythm: Normal rate and regular rhythm.     Heart sounds: Normal heart sounds.  Pulmonary:     Effort: Pulmonary effort is normal.     Breath sounds: Normal breath sounds. No wheezing.  Musculoskeletal:     Cervical back: Neck supple.  Lymphadenopathy:     Cervical: No cervical adenopathy.   Skin:    General: Skin is warm and dry.  Neurological:     Mental Status: She is alert and oriented to person, place, and time.     No results found for any visits on 02/03/23.      Assessment & Plan:   Problem List Items Addressed This Visit   None Visit Diagnoses     Nose pain    -  Primary   Acute non-recurrent sinusitis, unspecified location       Relevant Medications   amoxicillin-clavulanate (AUGMENTIN) 875-125 MG tablet      Right-sided nose and facial pain possible sinusitis.  We can go ahead and treat with Augmentin at this point even though she is not having any abnormal pus or drainage.  If she is not better after the weekend then I want her to let us know so we can refer her to ENT and make sure there is not something else like a polyp or mass that could be obstructing the sinuses on that side and causing her pain and discomfort.  Meds ordered this encounter  Medications   amoxicillin-clavulanate (AUGMENTIN) 875-125 MG tablet    Sig: Take 1 tablet by mouth 2 (two) times daily.    Dispense:  14 tablet    Refill:  0    No follow-ups on file.  Beatrice Lecher, MD

## 2023-02-22 DIAGNOSIS — L719 Rosacea, unspecified: Secondary | ICD-10-CM | POA: Diagnosis not present

## 2023-02-22 DIAGNOSIS — L814 Other melanin hyperpigmentation: Secondary | ICD-10-CM | POA: Diagnosis not present

## 2023-02-22 DIAGNOSIS — L821 Other seborrheic keratosis: Secondary | ICD-10-CM | POA: Diagnosis not present

## 2023-02-22 DIAGNOSIS — L579 Skin changes due to chronic exposure to nonionizing radiation, unspecified: Secondary | ICD-10-CM | POA: Diagnosis not present

## 2023-02-22 DIAGNOSIS — D235 Other benign neoplasm of skin of trunk: Secondary | ICD-10-CM | POA: Diagnosis not present

## 2023-03-07 ENCOUNTER — Encounter: Payer: Self-pay | Admitting: Family Medicine

## 2023-03-07 ENCOUNTER — Ambulatory Visit (INDEPENDENT_AMBULATORY_CARE_PROVIDER_SITE_OTHER): Payer: Medicare Other | Admitting: Family Medicine

## 2023-03-07 VITALS — BP 130/57 | HR 69 | Ht 61.0 in | Wt 195.0 lb

## 2023-03-07 DIAGNOSIS — E119 Type 2 diabetes mellitus without complications: Secondary | ICD-10-CM | POA: Diagnosis not present

## 2023-03-07 DIAGNOSIS — E118 Type 2 diabetes mellitus with unspecified complications: Secondary | ICD-10-CM | POA: Diagnosis not present

## 2023-03-07 DIAGNOSIS — E038 Other specified hypothyroidism: Secondary | ICD-10-CM

## 2023-03-07 DIAGNOSIS — I10 Essential (primary) hypertension: Secondary | ICD-10-CM

## 2023-03-07 DIAGNOSIS — D229 Melanocytic nevi, unspecified: Secondary | ICD-10-CM

## 2023-03-07 LAB — POCT GLYCOSYLATED HEMOGLOBIN (HGB A1C): Hemoglobin A1C: 6.3 % — AB (ref 4.0–5.6)

## 2023-03-07 MED ORDER — METOPROLOL SUCCINATE ER 200 MG PO TB24: 200.0000 mg | ORAL_TABLET | Freq: Every day | ORAL | 3 refills | Status: DC

## 2023-03-07 NOTE — Progress Notes (Addendum)
Established Patient Office Visit  Subjective   Patient ID: Emma Craig, female    DOB: 1951/02/27  Age: 72 y.o. MRN: [id number]  Chief Complaint  Patient presents with   Diabetes    HPI  Diabetes - no hypoglycemic events. No wounds or sores that are not healing well. No increased thirst or urination. Checking glucose at home. Taking medications as prescribed without any side effects. Down 3 lbs.    Hypertension- Pt denies chest pain, SOB, dizziness, or heart palpitations.  Taking meds as directed w/o problems.  Denies medication side effects.       ROS    Objective:     BP (!) 130/57   Pulse 69   Ht 5\' 1"  (1.549 m)   Wt 195 lb (88.5 kg)   LMP  (LMP Unknown)   SpO2 98%   BMI 36.84 kg/m    Physical Exam Vitals and nursing note reviewed.  Constitutional:      Appearance: She is well-developed.  HENT:     Head: Normocephalic and atraumatic.  Cardiovascular:     Rate and Rhythm: Normal rate and regular rhythm.     Heart sounds: Normal heart sounds.  Pulmonary:     Effort: Pulmonary effort is normal.     Breath sounds: Normal breath sounds.  Skin:    General: Skin is warm and dry.  Neurological:     Mental Status: She is alert and oriented to person, place, and time.  Psychiatric:        Behavior: Behavior normal.      Results for orders placed or performed in visit on 03/07/23  POCT glycosylated hemoglobin (Hb A1C)  Result Value Ref Range   Hemoglobin A1C 6.3 (A) 4.0 - 5.6 %   HbA1c POC (<> result, manual entry)     HbA1c, POC (prediabetic range)     HbA1c, POC (controlled diabetic range)        The 10-year ASCVD risk score (Arnett DK, et al., 2019) is: 21.1%    Assessment & Plan:   Problem List Items Addressed This Visit       Cardiovascular and Mediastinum   Essential hypertension    Well controlled. Continue current regimen. Follow up in  4 mo       Relevant Medications   metoprolol (TOPROL-XL) 200 MG 24 hr tablet     Endocrine    Type 2 diabetes, diet controlled   Hypothyroidism    TSH at goal.       Relevant Medications   metoprolol (TOPROL-XL) 200 MG 24 hr tablet   Controlled diabetes mellitus type 2 with complications - Primary    Well controlled. Continue current regimen. Follow up in  4 mo       Relevant Medications   metoprolol (TOPROL-XL) 200 MG 24 hr tablet   Other Relevant Orders   POCT glycosylated hemoglobin (Hb A1C) (Completed)   Other Visit Diagnoses     Atypical nevus          Also on her diabetic foot exam today noted to brown moles on the bottom of her left foot.  She says she had never noticed them before they are new.  She said she actually just saw her dermatologist about a month ago but they did not look at the bottom of her feet they mostly were doing an upper body evaluation.  I did encourage her to schedule with her dermatologist I am concerned about their appearance and I do think that  they may need to be further evaluated with a biopsy.  Return in about 4 months (around 07/07/2023) for Diabetes follow-up.    Nani Gasser, MD

## 2023-03-07 NOTE — Assessment & Plan Note (Signed)
Well controlled. Continue current regimen. Follow up in  4 mo 

## 2023-03-07 NOTE — Assessment & Plan Note (Signed)
TSH at goal.

## 2023-07-18 ENCOUNTER — Other Ambulatory Visit: Payer: Self-pay | Admitting: Family Medicine

## 2023-07-25 ENCOUNTER — Ambulatory Visit: Payer: Medicare Other | Admitting: Family Medicine

## 2023-08-17 ENCOUNTER — Ambulatory Visit (INDEPENDENT_AMBULATORY_CARE_PROVIDER_SITE_OTHER): Payer: Medicare Other | Admitting: Family Medicine

## 2023-08-17 ENCOUNTER — Encounter: Payer: Self-pay | Admitting: Family Medicine

## 2023-08-17 VITALS — BP 132/78 | HR 88 | Ht 61.0 in | Wt 187.0 lb

## 2023-08-17 DIAGNOSIS — Z7984 Long term (current) use of oral hypoglycemic drugs: Secondary | ICD-10-CM

## 2023-08-17 DIAGNOSIS — E1129 Type 2 diabetes mellitus with other diabetic kidney complication: Secondary | ICD-10-CM

## 2023-08-17 DIAGNOSIS — I10 Essential (primary) hypertension: Secondary | ICD-10-CM

## 2023-08-17 DIAGNOSIS — Z6835 Body mass index (BMI) 35.0-35.9, adult: Secondary | ICD-10-CM | POA: Diagnosis not present

## 2023-08-17 DIAGNOSIS — E038 Other specified hypothyroidism: Secondary | ICD-10-CM

## 2023-08-17 DIAGNOSIS — E118 Type 2 diabetes mellitus with unspecified complications: Secondary | ICD-10-CM | POA: Diagnosis not present

## 2023-08-17 DIAGNOSIS — R809 Proteinuria, unspecified: Secondary | ICD-10-CM | POA: Diagnosis not present

## 2023-08-17 LAB — POCT GLYCOSYLATED HEMOGLOBIN (HGB A1C): Hemoglobin A1C: 6.1 % — AB (ref 4.0–5.6)

## 2023-08-17 LAB — POCT UA - MICROALBUMIN
Creatinine, POC: 50 mg/dL
Microalbumin Ur, POC: 30 mg/L

## 2023-08-17 MED ORDER — LEVOTHYROXINE SODIUM 75 MCG PO TABS: 75.0000 ug | ORAL_TABLET | Freq: Every day | ORAL | 3 refills | Status: DC

## 2023-08-17 NOTE — Assessment & Plan Note (Signed)
Well controlled. Continue current regimen. Follow up in  4 months.

## 2023-08-17 NOTE — Assessment & Plan Note (Signed)
Well controlled with diet. Continue current regimen. Follow up in  50mo

## 2023-08-17 NOTE — Assessment & Plan Note (Signed)
Doing really well. Has lost weight since last here and A1C is improving.

## 2023-08-17 NOTE — Progress Notes (Addendum)
Established Patient Office Visit  Subjective   Patient ID: Emma Craig, female    DOB: 1951/08/27  Age: 72 y.o. MRN: [id number]  Chief Complaint  Patient presents with   Diabetes    HPI   Diabetes - no hypoglycemic events. No wounds or sores that are not healing well. No increased thirst or urination. Checking glucose at home. Taking medications as prescribed without any side effects.   Hypothyroidism - Taking medication regularly in the AM away from food and vitamins, etc. No recent change to skin, hair, or energy levels.     ROS    Objective:     BP 132/78   Pulse 88   Ht 5\' 1"  (1.549 m)   Wt 187 lb (84.8 kg)   LMP  (LMP Unknown)   SpO2 98%   BMI 35.33 kg/m    Physical Exam Vitals and nursing note reviewed.  Constitutional:      Appearance: Normal appearance.  HENT:     Head: Normocephalic and atraumatic.  Eyes:     Conjunctiva/sclera: Conjunctivae normal.  Cardiovascular:     Rate and Rhythm: Normal rate and regular rhythm.  Pulmonary:     Effort: Pulmonary effort is normal.     Breath sounds: Normal breath sounds.  Skin:    General: Skin is warm and dry.  Neurological:     Mental Status: She is alert.  Psychiatric:        Mood and Affect: Mood normal.      Results for orders placed or performed in visit on 08/17/23  POCT HgB A1C  Result Value Ref Range   Hemoglobin A1C 6.1 (A) 4.0 - 5.6 %   HbA1c POC (<> result, manual entry)     HbA1c, POC (prediabetic range)     HbA1c, POC (controlled diabetic range)    POCT UA - Microalbumin  Result Value Ref Range   Microalbumin Ur, POC 30 mg/L   Creatinine, POC 50 mg/dL   Albumin/Creatinine Ratio, Urine, POC 30-300       The 10-year ASCVD risk score (Arnett DK, et al., 2019) is: 23.9%    Assessment & Plan:   Problem List Items Addressed This Visit       Cardiovascular and Mediastinum   Essential hypertension    Well controlled. Continue current regimen. Follow up in  4 months.        Relevant Orders   CMP14+EGFR     Endocrine   Microalbuminuria due to type 2 diabetes mellitus (HCC)    Is new.  She is already on an ARB and actually her A1c is better controlled today so we will continue to monitor this carefully.      Hypothyroidism    Asymptomatic. Recheck TSH. Will go back to so doesn't have to splie the tabs anymore.  There was a shortage at one time but never got changed back.       Relevant Medications   levothyroxine (SYNTHROID) 75 MCG tablet   Other Relevant Orders   TSH   Controlled diabetes mellitus type 2 with complications (HCC) - Primary    Well controlled with diet. Continue current regimen. Follow up in  91mo       Relevant Orders   POCT HgB A1C (Completed)   CMP14+EGFR   POCT UA - Microalbumin (Completed)     Other   Severe obesity (BMI 35.0-35.9 with comorbidity) (HCC)    Doing really well. Has lost weight since last here and A1C is  improving.        Declined vaccines today.   Return in about 4 months (around 12/19/2023) for Hypertension, Diabetes follow-up.    Nani Gasser, MD

## 2023-08-17 NOTE — Assessment & Plan Note (Signed)
Is new.  She is already on an ARB and actually her A1c is better controlled today so we will continue to monitor this carefully.

## 2023-08-17 NOTE — Patient Instructions (Signed)
Please schedule medicare wellness exam. Can be in person or vitual.

## 2023-08-17 NOTE — Assessment & Plan Note (Addendum)
Asymptomatic. Recheck TSH. Will go back to so doesn't have to splie the tabs anymore.  There was a shortage at one time but never got changed back.

## 2023-08-17 NOTE — Addendum Note (Signed)
Addended by: Nani Gasser D on: 08/17/2023 12:57 PM   Modules accepted: Orders

## 2023-08-18 LAB — CMP14+EGFR
ALT: 21 IU/L (ref 0–32)
AST: 25 IU/L (ref 0–40)
Albumin: 4.4 g/dL (ref 3.9–4.9)
Alkaline Phosphatase: 77 IU/L (ref 44–121)
BUN/Creatinine Ratio: 14 (ref 12–28)
BUN: 16 mg/dL (ref 8–27)
Bilirubin Total: 0.3 mg/dL (ref 0.0–1.2)
CO2: 24 mmol/L (ref 20–29)
Calcium: 10.1 mg/dL (ref 8.7–10.3)
Chloride: 97 mmol/L (ref 96–106)
Creatinine, Ser: 1.11 mg/dL — ABNORMAL HIGH (ref 0.57–1.00)
Globulin, Total: 2.9 g/dL (ref 1.5–4.5)
Glucose: 120 mg/dL — ABNORMAL HIGH (ref 70–99)
Potassium: 4.3 mmol/L (ref 3.5–5.2)
Sodium: 140 mmol/L (ref 134–144)
Total Protein: 7.3 g/dL (ref 6.0–8.5)
eGFR: 53 mL/min/{1.73_m2} — ABNORMAL LOW (ref >59)

## 2023-08-18 LAB — TSH: TSH: 0.884 u[IU]/mL (ref 0.450–4.500)

## 2023-08-18 NOTE — Progress Notes (Signed)
Call patient: Kidney function is stable.  Thyroid looks good.

## 2023-08-22 ENCOUNTER — Ambulatory Visit (INDEPENDENT_AMBULATORY_CARE_PROVIDER_SITE_OTHER): Payer: Medicare Other | Admitting: Family Medicine

## 2023-08-22 DIAGNOSIS — Z780 Asymptomatic menopausal state: Secondary | ICD-10-CM

## 2023-08-22 DIAGNOSIS — Z0000 Encounter for general adult medical examination without abnormal findings: Secondary | ICD-10-CM

## 2023-08-22 DIAGNOSIS — Z1231 Encounter for screening mammogram for malignant neoplasm of breast: Secondary | ICD-10-CM

## 2023-08-22 NOTE — Progress Notes (Signed)
MEDICARE ANNUAL WELLNESS VISIT  08/22/2023  Telephone Visit Disclaimer This Medicare AWV was conducted by telephone due to national recommendations for restrictions regarding the COVID-19 Pandemic (e.g. social distancing).  I verified, using two identifiers, that I am speaking with Emma Craig or their authorized healthcare agent. I discussed the limitations, risks, security, and privacy concerns of performing an evaluation and management service by telephone and the potential availability of an in-person appointment in the future. The patient expressed understanding and agreed to proceed.  Location of Patient: Home Location of Provider (nurse):  Provider home  Subjective:    Emma Craig is a 72 y.o. female patient of Metheney, Barbarann Ehlers, MD who had a Medicare Annual Wellness Visit today via telephone. Wynnette is Retired and lives with their spouse. she has 2 children. she reports that she is socially active and does interact with friends/family regularly. she is moderately physically active and enjoys her life.  Patient Care Team: Agapito Games, MD as PCP - General (Family Medicine)     08/22/2023    7:58 AM 08/16/2022    8:07 AM 08/14/2021    2:55 PM 05/01/2019    8:09 AM 07/27/2018    8:39 AM 04/06/2017    9:37 AM 03/12/2014    8:24 AM  Advanced Directives  Does Patient Have a Medical Advance Directive? Yes Yes Yes Yes Yes No Patient does not have advance directive;Patient would like information  Type of Advance Directive Living will Living will Living will;Healthcare Power of [employer] Power of [place];Living will Healthcare Power of Attorney    Does patient want to make changes to medical advance directive? No - Patient declined No - Patient declined No - Patient declined      Copy of Healthcare Power of Attorney in Chart?   No - copy requested No - copy requested No - copy requested    Would patient like information on creating a medical advance directive?      No -  Patient declined Advance directive packet given    Hospital Utilization Over the Past 12 Months: # of hospitalizations or ER visits: 0 # of surgeries: 0  Review of Systems    Patient reports that her overall health is unchanged compared to last year.  History obtained from chart review and the patient  Patient Reported Readings (BP, Pulse, CBG, Weight, etc) none Per patient no change in vitals since last visit, unable to obtain new vitals due to telehealth visit  Pain Assessment Pain : No/denies pain     Current Medications & Allergies (verified) Allergies as of 08/22/2023   No Known Allergies      Medication List        Accurate as of August 22, 2023  8:15 AM. If you have any questions, ask your nurse or doctor.          CALCIUM 1200+D3 PO Take 1 tablet by mouth daily.   levothyroxine 75 MCG tablet Commonly known as: SYNTHROID Take 1 tablet (75 mcg total) by mouth daily before breakfast.   metoprolol 200 MG 24 hr tablet Commonly known as: TOPROL-XL Take 1 tablet (200 mg total) by mouth daily.   olmesartan-hydrochlorothiazide 40-12.5 MG tablet Commonly known as: Benicar HCT Take 1 tablet by mouth daily.   rosuvastatin 20 MG tablet Commonly known as: Crestor Take 1 tablet (20 mg total) by mouth daily.        History (reviewed): Past Medical History:  Diagnosis Date   Chronic kidney disease,  stage 2, mildly decreased GFR    Diabetes mellitus    Heavy menses    Hyperlipidemia    Hypertension    Obesity    Past Surgical History:  Procedure Laterality Date   CATARACT EXTRACTION  1-09   right    CATARACT EXTRACTION  09/2011   left    CHOLECYSTECTOMY     laproscopic   rt elbow screws placed  10-82   Family History  Problem Relation Age of Onset   Cancer Father 31       colon and prostate CA   Cancer Mother 21       carcinoid tumor/with mets to brain   Hypertension Sister    Hyperlipidemia Sister    Hypertension Brother     Hypertension Brother    Hypertension Sister    Hyperlipidemia Sister    Hypertension Sister    Hyperlipidemia Sister    Hypertension Sister    Hyperlipidemia Sister    Lymphoma Sister        non-hodgkins    Social History   Socioeconomic History   Marital status: Married    Spouse name: Tommy   Number of children: 2   Years of education: 13   Highest education level: Some college, no degree  Occupational History   Occupation: Reitred  Tobacco Use   Smoking status: Never   Smokeless tobacco: Never  Vaping Use   Vaping status: Never Used  Substance and Sexual Activity   Alcohol use: Yes    Comment: rare   Drug use: Not on file   Sexual activity: Not on file  Other Topics Concern   Not on file  Social History Narrative   Lives with her husband. She has two children. She states that she is enjoying life.   Social Determinants of Health   Financial Resource Strain: Low Risk  (08/22/2023)   Overall Financial Resource Strain (CARDIA)    Difficulty of Paying Living Expenses: Not hard at all  Food Insecurity: No Food Insecurity (08/22/2023)   Hunger Vital Sign    Worried About Running Out of Food in the Last Year: Never true    Ran Out of Food in the Last Year: Never true  Transportation Needs: No Transportation Needs (08/22/2023)   PRAPARE - Administrator, Civil Service (Medical): No    Lack of Transportation (Non-Medical): No  Physical Activity: Inactive (08/22/2023)   Exercise Vital Sign    Days of Exercise per Week: 0 days    Minutes of Exercise per Session: 0 min  Stress: No Stress Concern Present (08/22/2023)   [employer] of Occupational Health - Occupational Stress Questionnaire    Feeling of Stress : Not at all  Social Connections: Moderately Integrated (08/22/2023)   Social Connection and Isolation Panel [NHANES]    Frequency of Communication with Friends and Family: More than three times a week    Frequency of Social Gatherings with Friends and  Family: More than three times a week    Attends Religious Services: More than 4 times per year    Active Member of [employer] or Organizations: No    Attends Banker Meetings: Never    Marital Status: Married    Activities of Daily Living    08/22/2023    8:03 AM  In your present state of health, do you have any difficulty performing the following activities:  Hearing? 0  Vision? 0  Difficulty concentrating or making decisions? 0  Walking or climbing  stairs? 0  Dressing or bathing? 0  Doing errands, shopping? 0  Preparing Food and eating ? N  Using the Toilet? N  In the past six months, have you accidently leaked urine? N  Do you have problems with loss of bowel control? N  Managing your Medications? N  Managing your Finances? N  Housekeeping or managing your Housekeeping? N    Patient Education/ Literacy How often do you need to have someone help you when you read instructions, pamphlets, or other written materials from your doctor or pharmacy?: 1 - Never What is the last grade level you completed in school?: 12th grade  Exercise    Diet Patient reports consuming 2 meals a day and 0 snack(s) a day Patient reports that her primary diet is: Regular Patient reports that she does have regular access to food.   Depression Screen    08/22/2023    7:58 AM 03/07/2023    7:33 AM 02/03/2023    2:50 PM 01/13/2023   10:19 AM 12/06/2022    7:53 AM 08/16/2022    8:08 AM 06/03/2022    8:13 AM  PHQ 2/9 Scores  PHQ - 2 Score 0 0 0 0 0 0 0     Fall Risk    08/22/2023    7:58 AM 03/07/2023    7:32 AM 02/03/2023    2:50 PM 01/13/2023   10:19 AM 12/06/2022    7:53 AM  Fall Risk   Falls in the past year? 0 0 0 0 0  Number falls in past yr: 0 0 0 0 0  Injury with Fall? 0 0 0 0 0  Risk for fall due to : No Fall Risks No Fall Risks No Fall Risks No Fall Risks No Fall Risks  Follow up Falls evaluation completed Falls evaluation completed Falls evaluation completed Falls evaluation  completed Falls evaluation completed     Objective:  Melissasue Reinhold seemed alert and oriented and she participated appropriately during our telephone visit.  Blood Pressure Weight BMI  BP Readings from Last 3 Encounters:  08/17/23 132/78  03/07/23 (!) 130/57  02/03/23 (!) 141/59   Wt Readings from Last 3 Encounters:  08/17/23 187 lb (84.8 kg)  03/07/23 195 lb (88.5 kg)  02/03/23 198 lb 1.9 oz (89.9 kg)   BMI Readings from Last 1 Encounters:  08/17/23 35.33 kg/m    *Unable to obtain current vital signs, weight, and BMI due to telephone visit type  Hearing/Vision  Lizbett did not seem to have difficulty with hearing/understanding during the telephone conversation Reports that she has had a formal eye exam by an eye care professional within the past year Reports that she has not had a formal hearing evaluation within the past year *Unable to fully assess hearing and vision during telephone visit type  Cognitive Function:    08/22/2023    8:08 AM 08/16/2022    8:14 AM 08/14/2021    3:02 PM 07/27/2018    8:40 AM  6CIT Screen  What Year? 0 points 0 points 0 points 0 points  What month? 0 points 0 points 0 points 0 points  What time? 0 points 0 points 0 points 0 points  Count back from 20 0 points 0 points 0 points 0 points  Months in reverse 0 points 0 points 0 points 0 points  Repeat phrase 0 points 0 points 0 points 6 points  Total Score 0 points 0 points 0 points 6 points   (Normal:0-7, Significant for  Dysfunction: >8)  Normal Cognitive Function Screening: Yes   Immunization & Health Maintenance Record Immunization History  Administered Date(s) Administered   Moderna Sars-Covid-2 Vaccination 01/28/2020, 02/24/2020, 03/10/2020   Td 02/14/2009   Tdap 12/02/2019   Zoster Recombinant(Shingrix) 05/24/2018, 07/27/2018   Zoster, Live 01/10/2015    Health Maintenance  Topic Date Due   Pneumonia Vaccine 33+ Years old (1 of 1 - PCV) Never done   COVID-19 Vaccine (4 - 2023-24  season) 09/02/2023 (Originally 07/31/2023)   INFLUENZA VACCINE  02/27/2024 (Originally 06/30/2023)   OPHTHALMOLOGY EXAM  09/15/2023   DEXA SCAN  09/24/2023   HEMOGLOBIN A1C  02/14/2024   FOOT EXAM  03/06/2024   Diabetic kidney evaluation - eGFR measurement  08/16/2024   Diabetic kidney evaluation - Urine ACR  08/16/2024   Medicare Annual Wellness (AWV)  08/21/2024   MAMMOGRAM  10/13/2024   Colonoscopy  01/07/2028   DTaP/Tdap/Td (3 - Td or Tdap) 12/01/2029   Hepatitis C Screening  Completed   Zoster Vaccines- Shingrix  Completed   HPV VACCINES  Aged Out       Assessment  This is a routine wellness examination for [employer].  Health Maintenance: Due or Overdue Health Maintenance Due  Topic Date Due   Pneumonia Vaccine 21+ Years old (1 of 1 - PCV) Never done    Winni Kamstra does not need a referral for Community Assistance: Care Management:   no Social Work:    no Prescription Assistance:  no Nutrition/Diabetes Education:  no   Plan:  Personalized Goals  Goals Addressed               This Visit's Progress     Patient Stated (pt-stated)        Patient stated that she would like to work on her weight loss.       Personalized Health Maintenance & Screening Recommendations  Screening mammography Bone densitometry screening Pneumonia vaccine - declined Influenza vaccine - declined   Lung Cancer Screening Recommended: no (Low Dose CT Chest recommended if Age 46-80 years, 20 pack-year currently smoking OR have quit w/in past 15 years) Hepatitis C Screening recommended: no HIV Screening recommended: no  Advanced Directives: Written information was not prepared per patient's request.  Referrals & Orders Orders Placed This Encounter  Procedures   DEXAScan   Mammogram 3D SCREEN BREAST BILATERAL    Follow-up Plan Follow-up with Agapito Games, MD as planned Medicare wellness visit in one year.  AVS printed and mailed to the patient.     I have personally  reviewed and noted the following in the patient's chart:   Medical and social history Use of alcohol, tobacco or illicit drugs  Current medications and supplements Functional ability and status Nutritional status Physical activity Advanced directives List of other physicians Hospitalizations, surgeries, and ER visits in previous 12 months Vitals Screenings to include cognitive, depression, and falls Referrals and appointments  In addition, I have reviewed and discussed with Emma Craig certain preventive protocols, quality metrics, and best practice recommendations. A written personalized care plan for preventive services as well as general preventive health recommendations is available and can be mailed to the patient at her request.      Modesto Charon, RN BSN  08/22/2023

## 2023-08-22 NOTE — Patient Instructions (Addendum)
MEDICARE ANNUAL WELLNESS VISIT Health Maintenance Summary and Written Plan of Care  Ms. Sanor ,  Thank you for allowing me to perform your Medicare Annual Wellness Visit and for your ongoing commitment to your health.   Health Maintenance & Immunization History Health Maintenance  Topic Date Due   Pneumonia Vaccine 53+ Years old (1 of 1 - PCV) Never done   COVID-19 Vaccine (4 - 2023-24 season) 09/02/2023 (Originally 07/31/2023)   INFLUENZA VACCINE  02/27/2024 (Originally 06/30/2023)   OPHTHALMOLOGY EXAM  09/15/2023   DEXA SCAN  09/24/2023   HEMOGLOBIN A1C  02/14/2024   FOOT EXAM  03/06/2024   Diabetic kidney evaluation - eGFR measurement  08/16/2024   Diabetic kidney evaluation - Urine ACR  08/16/2024   Medicare Annual Wellness (AWV)  08/21/2024   MAMMOGRAM  10/13/2024   Colonoscopy  01/07/2028   DTaP/Tdap/Td (3 - Td or Tdap) 12/01/2029   Hepatitis C Screening  Completed   Zoster Vaccines- Shingrix  Completed   HPV VACCINES  Aged Out   Immunization History  Administered Date(s) Administered   Moderna Sars-Covid-2 Vaccination 01/28/2020, 02/24/2020, 03/10/2020   Td 02/14/2009   Tdap 12/02/2019   Zoster Recombinant(Shingrix) 05/24/2018, 07/27/2018   Zoster, Live 01/10/2015    These are the patient goals that we discussed:  Goals Addressed               This Visit's Progress     Patient Stated (pt-stated)        Patient stated that she would like to work on her weight loss.         This is a list of Health Maintenance Items that are overdue or due now: Screening mammography Bone densitometry screening Pneumonia vaccine - declined Influenza vaccine - declined   Orders/Referrals Placed Today: Orders Placed This Encounter  Procedures   DEXAScan    Standing Status:   Future    Standing Expiration Date:   08/21/2024    Scheduling Instructions:     Please call patient to schedule.    Order Specific Question:   Reason for exam:    Answer:   post menopausal     Order Specific Question:   Preferred imaging location?    Answer:   [facility]   Mammogram 3D SCREEN BREAST BILATERAL    Standing Status:   Future    Standing Expiration Date:   08/21/2024    Scheduling Instructions:     Please call patient to schedule.    Order Specific Question:   Reason for Exam (SYMPTOM  OR DIAGNOSIS REQUIRED)    Answer:   breast cancer screening    Order Specific Question:   Preferred imaging location?    Answer:   [facility]   (Contact our referral department at [phone] if you have not spoken with someone about your referral appointment within the next 5 days)    Follow-up Plan Follow-up with Agapito Games, MD as planned Medicare wellness visit in one year.  AVS printed and mailed to the patient.       Health Maintenance, Female Adopting a healthy lifestyle and getting preventive care are important in promoting health and wellness. Ask your health care provider about: The right schedule for you to have regular tests and exams. Things you can do on your own to prevent diseases and keep yourself healthy. What should I know about diet, weight, and exercise? Eat a healthy diet  Eat a diet that includes plenty of vegetables, fruits, low-fat dairy products,  and lean protein. Do not eat a lot of foods that are high in solid fats, added sugars, or sodium. Maintain a healthy weight Body mass index (BMI) is used to identify weight problems. It estimates body fat based on height and weight. Your health care provider can help determine your BMI and help you achieve or maintain a healthy weight. Get regular exercise Get regular exercise. This is one of the most important things you can do for your health. Most adults should: Exercise for at least 150 minutes each week. The exercise should increase your heart rate and make you sweat (moderate-intensity exercise). Do strengthening exercises at least twice a week. This is in addition to  the moderate-intensity exercise. Spend less time sitting. Even light physical activity can be beneficial. Watch cholesterol and blood lipids Have your blood tested for lipids and cholesterol at 70 years of age, then have this test every 5 years. Have your cholesterol levels checked more often if: Your lipid or cholesterol levels are high. You are older than 70 years of age. You are at high risk for heart disease. What should I know about cancer screening? Depending on your health history and family history, you may need to have cancer screening at various ages. This may include screening for: Breast cancer. Cervical cancer. Colorectal cancer. Skin cancer. Lung cancer. What should I know about heart disease, diabetes, and high blood pressure? Blood pressure and heart disease High blood pressure causes heart disease and increases the risk of stroke. This is more likely to develop in people who have high blood pressure readings or are overweight. Have your blood pressure checked: Every 3-5 years if you are 52-98 years of age. Every year if you are 28 years old or older. Diabetes Have regular diabetes screenings. This checks your fasting blood sugar level. Have the screening done: Once every three years after age 74 if you are at a normal weight and have a low risk for diabetes. More often and at a younger age if you are overweight or have a high risk for diabetes. What should I know about preventing infection? Hepatitis B If you have a higher risk for hepatitis B, you should be screened for this virus. Talk with your health care provider to find out if you are at risk for hepatitis B infection. Hepatitis C Testing is recommended for: Everyone born from [birth year] through 1963. Anyone with known risk factors for hepatitis C. Sexually transmitted infections (STIs) Get screened for STIs, including gonorrhea and chlamydia, if: You are sexually active and are younger than 70 years of age. You  are older than 70 years of age and your health care provider tells you that you are at risk for this type of infection. Your sexual activity has changed since you were last screened, and you are at increased risk for chlamydia or gonorrhea. Ask your health care provider if you are at risk. Ask your health care provider about whether you are at high risk for HIV. Your health care provider may recommend a prescription medicine to help prevent HIV infection. If you choose to take medicine to prevent HIV, you should first get tested for HIV. You should then be tested every 3 months for as long as you are taking the medicine. Pregnancy If you are about to stop having your period (premenopausal) and you may become pregnant, seek counseling before you get pregnant. Take 400 to 800 micrograms (mcg) of folic acid every day if you become pregnant. Ask for birth  control (contraception) if you want to prevent pregnancy. Osteoporosis and menopause Osteoporosis is a disease in which the bones lose minerals and strength with aging. This can result in bone fractures. If you are 41 years old or older, or if you are at risk for osteoporosis and fractures, ask your health care provider if you should: Be screened for bone loss. Take a calcium or vitamin D supplement to lower your risk of fractures. Be given hormone replacement therapy (HRT) to treat symptoms of menopause. Follow these instructions at home: Alcohol use Do not drink alcohol if: Your health care provider tells you not to drink. You are pregnant, may be pregnant, or are planning to become pregnant. If you drink alcohol: Limit how much you have to: 0-1 drink a day. Know how much alcohol is in your drink. In the [place] one drink equals one 12 oz bottle of beer (355 mL), one 5 oz glass of wine (148 mL), or one 1 oz glass of hard liquor (44 mL). Lifestyle Do not use any products that contain nicotine or tobacco. These products include cigarettes, chewing  tobacco, and vaping devices, such as e-cigarettes. If you need help quitting, ask your health care provider. Do not use street drugs. Do not share needles. Ask your health care provider for help if you need support or information about quitting drugs. General instructions Schedule regular health, dental, and eye exams. Stay current with your vaccines. Tell your health care provider if: You often feel depressed. You have ever been abused or do not feel safe at home. Summary Adopting a healthy lifestyle and getting preventive care are important in promoting health and wellness. Follow your health care provider's instructions about healthy diet, exercising, and getting tested or screened for diseases. Follow your health care provider's instructions on monitoring your cholesterol and blood pressure. This information is not intended to replace advice given to you by your health care provider. Make sure you discuss any questions you have with your health care provider. Document Revised: 04/06/2021 Document Reviewed: 04/06/2021 Elsevier Patient Education  2024 [employer].

## 2023-10-03 DIAGNOSIS — E119 Type 2 diabetes mellitus without complications: Secondary | ICD-10-CM | POA: Diagnosis not present

## 2023-10-03 DIAGNOSIS — D3131 Benign neoplasm of right choroid: Secondary | ICD-10-CM | POA: Diagnosis not present

## 2023-10-03 DIAGNOSIS — Z961 Presence of intraocular lens: Secondary | ICD-10-CM | POA: Diagnosis not present

## 2023-10-03 DIAGNOSIS — H43813 Vitreous degeneration, bilateral: Secondary | ICD-10-CM | POA: Diagnosis not present

## 2023-10-03 DIAGNOSIS — Z821 Family history of blindness and visual loss: Secondary | ICD-10-CM | POA: Diagnosis not present

## 2023-10-03 LAB — OPHTHALMOLOGY REPORT-SCANNED

## 2023-10-19 ENCOUNTER — Ambulatory Visit: Payer: Medicare Other

## 2023-10-19 DIAGNOSIS — M810 Age-related osteoporosis without current pathological fracture: Secondary | ICD-10-CM | POA: Diagnosis not present

## 2023-10-19 DIAGNOSIS — Z1231 Encounter for screening mammogram for malignant neoplasm of breast: Secondary | ICD-10-CM

## 2023-10-19 DIAGNOSIS — Z0000 Encounter for general adult medical examination without abnormal findings: Secondary | ICD-10-CM

## 2023-10-19 DIAGNOSIS — Z780 Asymptomatic menopausal state: Secondary | ICD-10-CM | POA: Diagnosis not present

## 2023-10-21 NOTE — Progress Notes (Signed)
Please call patient. Normal mammogram.  Repeat in 1 year.  

## 2023-11-04 NOTE — Progress Notes (Signed)
Call patient: Bone density shows a T-score of -3.0 which is most consistent with osteoporosis.   The current recommendation for osteoporosis treatment includes:   #1 calcium-total of 1200 mg of calcium daily.  If you eat a very calcium rich diet you may be able to obtain that without a supplement.  If not, then I recommend calcium 500 mg twice a day.  There are several products over-the-counter such as Caltrate D and Viactiv chews which are great options that contain calcium and vitamin D. #2 vitamin D-recommend 800 international units daily. #3 exercise-recommend 30 minutes of weightbearing exercise 3 days a week.  Resistance training ,such as doing bands and light weights, can be particularly helpful. #4 medication-if you are not currently on a bone builder, also called a bisphosphonate, then this has been shown to be very helpful in maintaining bone strength, preventing further thinning of the bones, and reducing your risk for fractures.  I would highly recommend that you consider starting 1 of these medications.  If you are okay with that then please let us know and we will send one to your pharmacy.  If you would like to discuss further we are happy to make an appointment for you so that we can go over options for treatment.

## 2023-11-09 NOTE — Progress Notes (Signed)
The Caltrate D is perfect continue with that current regimen as well as the exercise.

## 2023-12-07 ENCOUNTER — Other Ambulatory Visit: Payer: Self-pay | Admitting: Family Medicine

## 2023-12-19 ENCOUNTER — Ambulatory Visit (INDEPENDENT_AMBULATORY_CARE_PROVIDER_SITE_OTHER): Payer: Medicare Other | Admitting: Family Medicine

## 2023-12-19 ENCOUNTER — Encounter: Payer: Self-pay | Admitting: Family Medicine

## 2023-12-19 VITALS — BP 128/68 | HR 69 | Ht 61.0 in | Wt 186.0 lb

## 2023-12-19 DIAGNOSIS — N1831 Chronic kidney disease, stage 3a: Secondary | ICD-10-CM | POA: Diagnosis not present

## 2023-12-19 DIAGNOSIS — I10 Essential (primary) hypertension: Secondary | ICD-10-CM | POA: Diagnosis not present

## 2023-12-19 DIAGNOSIS — E119 Type 2 diabetes mellitus without complications: Secondary | ICD-10-CM

## 2023-12-19 DIAGNOSIS — Z6835 Body mass index (BMI) 35.0-35.9, adult: Secondary | ICD-10-CM | POA: Diagnosis not present

## 2023-12-19 LAB — POCT GLYCOSYLATED HEMOGLOBIN (HGB A1C): Hemoglobin A1C: 6.3 % — AB (ref 4.0–5.6)

## 2023-12-19 MED ORDER — OLMESARTAN MEDOXOMIL-HCTZ 40-12.5 MG PO TABS: 1.0000 | ORAL_TABLET | Freq: Every day | ORAL | 3 refills | Status: DC

## 2023-12-19 NOTE — Progress Notes (Signed)
   Established Patient Office Visit  Subjective  Patient ID: Emma Craig, female    DOB: March 14, 1951  Age: 73 y.o. MRN: [id number]  Chief Complaint  Patient presents with   Diabetes   Hypertension    HPI  Hypertension- Pt denies chest pain, SOB, dizziness, or heart palpitations.  Taking meds as directed w/o problems.  Denies medication side effects.    Diabetes - no hypoglycemic events. No wounds or sores that are not healing well. No increased thirst or urination. Checking glucose at home. Taking medications as prescribed without any side effects.   ROS    Objective:     BP 128/68   Pulse 69   Ht 5\' 1"  (1.549 m)   Wt 186 lb (84.4 kg)   LMP  (LMP Unknown)   SpO2 99%   BMI 35.14 kg/m    Physical Exam Vitals and nursing note reviewed.  Constitutional:      Appearance: Normal appearance.  HENT:     Head: Normocephalic and atraumatic.  Eyes:     Conjunctiva/sclera: Conjunctivae normal.  Cardiovascular:     Rate and Rhythm: Normal rate and regular rhythm.  Pulmonary:     Effort: Pulmonary effort is normal.     Breath sounds: Normal breath sounds.  Skin:    General: Skin is warm and dry.  Neurological:     Mental Status: She is alert.  Psychiatric:        Mood and Affect: Mood normal.      Results for orders placed or performed in visit on 12/19/23  POCT HgB A1C  Result Value Ref Range   Hemoglobin A1C 6.3 (A) 4.0 - 5.6 %   HbA1c POC (<> result, manual entry)     HbA1c, POC (prediabetic range)     HbA1c, POC (controlled diabetic range)         The 10-year ASCVD risk score (Arnett DK, et al., 2019) is: 22.6%    Assessment & Plan:   Problem List Items Addressed This Visit       Cardiovascular and Mediastinum   Essential hypertension - Primary   Well controlled. Continue current regimen. Follow up in  6 mo       Relevant Medications   olmesartan-hydrochlorothiazide (BENICAR HCT) 40-12.5 MG tablet   Other Relevant Orders   CMP14+EGFR   Lipid  panel   CBC     Endocrine   Type 2 diabetes, diet controlled (HCC)   A1C up slightly after holiday.  Work on diet and exercise. She is trying to lose weight. Continue ARB.  F/U in 6 months. Due for labs.         Relevant Medications   olmesartan-hydrochlorothiazide (BENICAR HCT) 40-12.5 MG tablet   Other Relevant Orders   POCT HgB A1C (Completed)   CMP14+EGFR   Lipid panel   CBC     Genitourinary   CKD (chronic kidney disease) stage 3, GFR 30-59 ml/min (HCC)   Due to recheck renal function.          Other   Severe obesity (BMI 35.0-35.9 with comorbidity) (HCC)   She is using Almaded, which is a protein meal replacement powder twice a day and eating one meal a day. She is not currently exercise. She lost 7 lbs before the Holidays but then gained it back over the [place].        Return in about 6 months (around 06/17/2024) for Hypertension, Diabetes follow-up.    Nani Gasser, MD

## 2023-12-19 NOTE — Assessment & Plan Note (Signed)
Due to recheck renal function. 

## 2023-12-19 NOTE — Assessment & Plan Note (Signed)
Well controlled. Continue current regimen. Follow up in  6 mo  

## 2023-12-19 NOTE — Assessment & Plan Note (Signed)
A1C up slightly after holiday.  Work on diet and exercise. She is trying to lose weight. Continue ARB.  F/U in 6 months. Due for labs.

## 2023-12-19 NOTE — Assessment & Plan Note (Signed)
She is using Almaded, which is a protein meal replacement powder twice a day and eating one meal a day. She is not currently exercise. She lost 7 lbs before the Holidays but then gained it back over the [place].

## 2024-01-30 DIAGNOSIS — E119 Type 2 diabetes mellitus without complications: Secondary | ICD-10-CM | POA: Diagnosis not present

## 2024-01-30 DIAGNOSIS — I10 Essential (primary) hypertension: Secondary | ICD-10-CM | POA: Diagnosis not present

## 2024-01-31 LAB — LIPID PANEL
Chol/HDL Ratio: 3.6 ratio (ref 0.0–4.4)
Cholesterol, Total: 190 mg/dL (ref 100–199)
HDL: 53 mg/dL (ref >39)
LDL Chol Calc (NIH): 94 mg/dL (ref 0–99)
Triglycerides: 259 mg/dL — ABNORMAL HIGH (ref 0–149)
VLDL Cholesterol Cal: 43 mg/dL — ABNORMAL HIGH (ref 5–40)

## 2024-01-31 LAB — CMP14+EGFR
ALT: 24 IU/L (ref 0–32)
AST: 26 IU/L (ref 0–40)
Albumin: 4.3 g/dL (ref 3.9–4.9)
Alkaline Phosphatase: 68 IU/L (ref 44–121)
BUN/Creatinine Ratio: 16 (ref 12–28)
BUN: 19 mg/dL (ref 8–27)
Bilirubin Total: 0.2 mg/dL (ref 0.0–1.2)
CO2: 24 mmol/L (ref 20–29)
Calcium: 9.8 mg/dL (ref 8.7–10.3)
Chloride: 99 mmol/L (ref 96–106)
Creatinine, Ser: 1.21 mg/dL — ABNORMAL HIGH (ref 0.57–1.00)
Globulin, Total: 2.3 g/dL (ref 1.5–4.5)
Glucose: 116 mg/dL — ABNORMAL HIGH (ref 70–99)
Potassium: 4.2 mmol/L (ref 3.5–5.2)
Sodium: 140 mmol/L (ref 134–144)
Total Protein: 6.6 g/dL (ref 6.0–8.5)
eGFR: 48 mL/min/{1.73_m2} — ABNORMAL LOW (ref >59)

## 2024-01-31 LAB — CBC
Hematocrit: 41.5 % (ref 34.0–46.6)
Hemoglobin: 13.9 g/dL (ref 11.1–15.9)
MCH: 31.1 pg (ref 26.6–33.0)
MCHC: 33.5 g/dL (ref 31.5–35.7)
MCV: 93 fL (ref 79–97)
Platelets: 280 10*3/uL (ref 150–450)
RBC: 4.47 x10E6/uL (ref 3.77–5.28)
RDW: 12.4 % (ref 11.7–15.4)
WBC: 8.2 10*3/uL (ref 3.4–10.8)

## 2024-01-31 NOTE — Progress Notes (Signed)
 Call patient: Kidney functions up to slightly baseline is around 1.1 but this time it was 1.2.  Lets plan to do like a renal panel in about 3 months.  Triglycerides are still up a little bit just continue to work on healthy diet and regular exercise.  Continue taking the Crestor.  Blood count is normal.

## 2024-02-21 DIAGNOSIS — L570 Actinic keratosis: Secondary | ICD-10-CM | POA: Diagnosis not present

## 2024-02-21 DIAGNOSIS — L821 Other seborrheic keratosis: Secondary | ICD-10-CM | POA: Diagnosis not present

## 2024-02-21 DIAGNOSIS — L579 Skin changes due to chronic exposure to nonionizing radiation, unspecified: Secondary | ICD-10-CM | POA: Diagnosis not present

## 2024-02-21 DIAGNOSIS — L814 Other melanin hyperpigmentation: Secondary | ICD-10-CM | POA: Diagnosis not present

## 2024-02-21 DIAGNOSIS — D225 Melanocytic nevi of trunk: Secondary | ICD-10-CM | POA: Diagnosis not present

## 2024-03-08 ENCOUNTER — Ambulatory Visit: Payer: Self-pay | Admitting: *Deleted

## 2024-03-21 ENCOUNTER — Other Ambulatory Visit: Payer: Self-pay | Admitting: Family Medicine

## 2024-03-21 DIAGNOSIS — E118 Type 2 diabetes mellitus with unspecified complications: Secondary | ICD-10-CM

## 2024-03-21 DIAGNOSIS — I10 Essential (primary) hypertension: Secondary | ICD-10-CM

## 2024-06-18 ENCOUNTER — Encounter: Payer: Self-pay | Admitting: Family Medicine

## 2024-06-18 ENCOUNTER — Ambulatory Visit (INDEPENDENT_AMBULATORY_CARE_PROVIDER_SITE_OTHER): Payer: Medicare Other | Admitting: Family Medicine

## 2024-06-18 VITALS — BP 118/62 | HR 70 | Ht 61.0 in | Wt 189.1 lb

## 2024-06-18 DIAGNOSIS — N1831 Chronic kidney disease, stage 3a: Secondary | ICD-10-CM | POA: Diagnosis not present

## 2024-06-18 DIAGNOSIS — R809 Proteinuria, unspecified: Secondary | ICD-10-CM

## 2024-06-18 DIAGNOSIS — E1129 Type 2 diabetes mellitus with other diabetic kidney complication: Secondary | ICD-10-CM | POA: Diagnosis not present

## 2024-06-18 DIAGNOSIS — I10 Essential (primary) hypertension: Secondary | ICD-10-CM | POA: Diagnosis not present

## 2024-06-18 DIAGNOSIS — E119 Type 2 diabetes mellitus without complications: Secondary | ICD-10-CM | POA: Diagnosis not present

## 2024-06-18 DIAGNOSIS — E038 Other specified hypothyroidism: Secondary | ICD-10-CM

## 2024-06-18 LAB — POCT GLYCOSYLATED HEMOGLOBIN (HGB A1C): Hemoglobin A1C: 6 % — AB (ref 4.0–5.6)

## 2024-06-18 NOTE — Progress Notes (Signed)
   Established Patient Office Visit  Subjective  Patient ID: Emma Craig, female    DOB: Sep 05, 1951  Age: 73 y.o. MRN: [id number]  Chief Complaint  Patient presents with   Diabetes   Hypertension    HPI  Diabetes - no hypoglycemic events. No wounds or sores that are not healing well. No increased thirst or urination. Checking glucose at home.not on medications.   Hypertension- Pt denies chest pain, SOB, or heart palpitations.  Taking meds as directed w/o problems.  Denies medication side effects.    About a month ago had about 2 weeks of dizziness when sitting up in bed or bending over. She is feeling much better.       ROS    Objective:     BP 118/62   Pulse 70   Ht 5' 1 (1.549 m)   Wt 189 lb 1.9 oz (85.8 kg)   LMP  (LMP Unknown)   SpO2 100%   BMI 35.73 kg/m    Physical Exam Vitals and nursing note reviewed.  Constitutional:      Appearance: Normal appearance.  HENT:     Head: Normocephalic and atraumatic.  Eyes:     Conjunctiva/sclera: Conjunctivae normal.  Cardiovascular:     Rate and Rhythm: Normal rate and regular rhythm.  Pulmonary:     Effort: Pulmonary effort is normal.     Breath sounds: Normal breath sounds.  Skin:    General: Skin is warm and dry.  Neurological:     Mental Status: She is alert.  Psychiatric:        Mood and Affect: Mood normal.      Results for orders placed or performed in visit on 06/18/24  POCT HgB A1C  Result Value Ref Range   Hemoglobin A1C 6.0 (A) 4.0 - 5.6 %   HbA1c POC (<> result, manual entry)     HbA1c, POC (prediabetic range)     HbA1c, POC (controlled diabetic range)        The 10-year ASCVD risk score (Arnett DK, et al., 2019) is: 19.7%    Assessment & Plan:   Problem List Items Addressed This Visit       Cardiovascular and Mediastinum   Essential hypertension   Well controlled. Continue current regimen. Follow up in  57mo         Endocrine   Type 2 diabetes, diet controlled (HCC) - Primary    Will calll to get last eye exam report.  Well controlled. Continue current regimen. Follow up in  4 months.       Relevant Orders   POCT HgB A1C (Completed)   Microalbuminuria due to type 2 diabetes mellitus (HCC)   On daily ARB      Hypothyroidism   She feels asymptomatic.         Genitourinary   CKD (chronic kidney disease) stage 3, GFR 30-59 ml/min (HCC)   Follow renal function Q 6 mo        Return in about 4 months (around 10/19/2024) for Diabetes follow-up, Hypertension, Thyroid  check up .    [name], MD

## 2024-06-18 NOTE — Assessment & Plan Note (Addendum)
 Will calll to get last eye exam report.  Well controlled. Continue current regimen. Follow up in  4 months.

## 2024-06-18 NOTE — Assessment & Plan Note (Signed)
 She feels asymptomatic.

## 2024-06-18 NOTE — Assessment & Plan Note (Signed)
 Well controlled. Continue current regimen. Follow up in  6 mo

## 2024-06-18 NOTE — Assessment & Plan Note (Signed)
 On daily ARB

## 2024-06-18 NOTE — Assessment & Plan Note (Signed)
 Follow renal function Q 6 mo

## 2024-08-20 ENCOUNTER — Other Ambulatory Visit: Payer: Self-pay | Admitting: Family Medicine

## 2024-08-20 DIAGNOSIS — E038 Other specified hypothyroidism: Secondary | ICD-10-CM

## 2024-08-22 ENCOUNTER — Ambulatory Visit (INDEPENDENT_AMBULATORY_CARE_PROVIDER_SITE_OTHER): Payer: Medicare Other

## 2024-08-22 VITALS — Ht 61.5 in | Wt 189.0 lb

## 2024-08-22 DIAGNOSIS — Z0000 Encounter for general adult medical examination without abnormal findings: Secondary | ICD-10-CM

## 2024-08-22 NOTE — Progress Notes (Signed)
 Subjective:   Emma Craig is a 73 y.o. female who presents for Medicare Annual (Subsequent) preventive examination.  Visit Complete: Virtual I connected with  Winton Grand on 08/22/24 by a audio enabled telemedicine application and verified that I am speaking with the correct person using two identifiers.  Patient Location: Home  Provider Location: Office/Clinic  I discussed the limitations of evaluation and management by telemedicine. The patient expressed understanding and agreed to proceed.  Vital Signs: Because this visit was a virtual/telehealth visit, some criteria may be missing or patient reported. Any vitals not documented were not able to be obtained and vitals that have been documented are patient reported.  Patient Medicare AWV questionnaire was completed by the patient on n/a; I have confirmed that all information answered by patient is correct and no changes since this date.  Cardiac Risk Factors include: advanced age (>69men, >58 women);obesity (BMI >30kg/m2);diabetes mellitus;hypertension;dyslipidemia     Objective:    Today's Vitals   08/22/24 0759  Weight: 189 lb (85.7 kg)  Height: 5' 1.5 (1.562 m)  PainSc: 5    Body mass index is 35.13 kg/m.     08/22/2024    8:08 AM 08/22/2023    7:58 AM 08/16/2022    8:07 AM 08/14/2021    2:55 PM 05/01/2019    8:09 AM 07/27/2018    8:39 AM 04/06/2017    9:37 AM  Advanced Directives  Does Patient Have a Medical Advance Directive? Yes Yes Yes Yes Yes Yes  No   Type of Estate agent of [place];Living will Living will Living will Living will;Healthcare Power of [employer] Power of [place];Living will Healthcare Power of Attorney   Does patient want to make changes to medical advance directive? No - Patient declined No - Patient declined No - Patient declined No - Patient declined     Copy of Healthcare Power of Attorney in Chart?    No - copy requested No - copy requested  No - copy requested     Would patient like information on creating a medical advance directive?       No - Patient declined      Data saved with a previous flowsheet row definition    Current Medications (verified) Outpatient Encounter Medications as of 08/22/2024  Medication Sig   Calcium -Magnesium-Vitamin D (CALCIUM  1200+D3 PO) Take 1 tablet by mouth daily.   levothyroxine  (SYNTHROID ) 75 MCG tablet Take 1 tablet (75 mcg total) by mouth daily before breakfast.   metoprolol  (TOPROL -XL) 200 MG 24 hr tablet TAKE 1 TABLET DAILY   olmesartan -hydrochlorothiazide  (BENICAR  HCT) 40-12.5 MG tablet Take 1 tablet by mouth daily.   rosuvastatin  (CRESTOR ) 20 MG tablet TAKE 1 TABLET DAILY   No facility-administered encounter medications on file as of 08/22/2024.    Allergies (verified) Patient has no known allergies.   History: Past Medical History:  Diagnosis Date   Chronic kidney disease, stage 2, mildly decreased GFR    Diabetes mellitus    Heavy menses    Hyperlipidemia    Hypertension    Obesity    Past Surgical History:  Procedure Laterality Date   CATARACT EXTRACTION  1-09   right    CATARACT EXTRACTION  09/2011   left    CHOLECYSTECTOMY     laproscopic   rt elbow screws placed  10-82   Family History  Problem Relation Age of Onset   Cancer Father 32       colon and prostate CA  Cancer Mother 53       carcinoid tumor/with mets to brain   Hypertension Sister    Hyperlipidemia Sister    Hypertension Brother    Hypertension Brother    Hypertension Sister    Hyperlipidemia Sister    Hypertension Sister    Hyperlipidemia Sister    Hypertension Sister    Hyperlipidemia Sister    Lymphoma Sister        non-hodgkins    Social History   Socioeconomic History   Marital status: Married    Spouse name: Tommy   Number of children: 2   Years of education: 13   Highest education level: Some college, no degree  Occupational History   Occupation: Reitred  Tobacco Use   Smoking status: Never    Smokeless tobacco: Never  Vaping Use   Vaping status: Never Used  Substance and Sexual Activity   Alcohol use: Yes    Comment: rare   Drug use: Not on file   Sexual activity: Not on file  Other Topics Concern   Not on file  Social History Narrative   Lives with her husband. She has two children. She states that she is enjoying life.   Social Drivers of Corporate investment banker Strain: Low Risk  (08/22/2024)   Overall Financial Resource Strain (CARDIA)    Difficulty of Paying Living Expenses: Not hard at all  Food Insecurity: No Food Insecurity (08/22/2024)   Hunger Vital Sign    Worried About Running Out of Food in the Last Year: Never true    Ran Out of Food in the Last Year: Never true  Transportation Needs: No Transportation Needs (08/22/2024)   PRAPARE - Administrator, Civil Service (Medical): No    Lack of Transportation (Non-Medical): No  Physical Activity: Inactive (08/22/2024)   Exercise Vital Sign    Days of Exercise per Week: 0 days    Minutes of Exercise per Session: 0 min  Stress: No Stress Concern Present (08/22/2024)   [employer] of Occupational Health - Occupational Stress Questionnaire    Feeling of Stress: Not at all  Social Connections: Moderately Integrated (08/22/2024)   Social Connection and Isolation Panel    Frequency of Communication with Friends and Family: More than three times a week    Frequency of Social Gatherings with Friends and Family: More than three times a week    Attends Religious Services: More than 4 times per year    Active Member of [employer] or Organizations: No    Attends Engineer, structural: Never    Marital Status: Married    Tobacco Counseling Counseling given: Not Answered   Clinical Intake:  Pre-visit preparation completed: Yes  Pain : 0-10 Pain Score: 5  Pain Type: Chronic pain Pain Location: Neck Pain Descriptors / Indicators: Constant Pain Onset: More than a month ago     BMI -  recorded: 35.13 Diabetes: No  How often do you need to have someone help you when you read instructions, pamphlets, or other written materials from your doctor or pharmacy?: 1 - Never What is the last grade level you completed in school?: 12  Interpreter Needed?: No      Activities of Daily Living    08/22/2024    8:01 AM  In your present state of health, do you have any difficulty performing the following activities:  Hearing? 0  Vision? 0  Difficulty concentrating or making decisions? 0  Walking or climbing stairs? 0  Dressing or bathing? 0  Doing errands, shopping? 0  Preparing Food and eating ? N  Using the Toilet? N  In the past six months, have you accidently leaked urine? N  Do you have problems with loss of bowel control? N  Managing your Medications? N  Managing your Finances? N  Housekeeping or managing your Housekeeping? N    Patient Care Team: Alvan Dorothyann BIRCH, MD as PCP - General (Family Medicine) Geroge Iha, OD as Referring Physician (Optometry) Kostuchenko, Paul, MD as Referring Physician (Dermatopathology)  Indicate any recent Medical Services you may have received from other than [facility] providers in the past year (date may be approximate).     Assessment:   This is a routine wellness examination for Emma Craig.  Hearing/Vision screen No results found.   Goals Addressed             This Visit's Progress    Patient Stated       Patient states she would like to continue to enjoy every moment.        Depression Screen    08/22/2024    8:06 AM 06/18/2024    7:25 AM 08/22/2023    7:58 AM 03/07/2023    7:33 AM 02/03/2023    2:50 PM 01/13/2023   10:19 AM 12/06/2022    7:53 AM  PHQ 2/9 Scores  PHQ - 2 Score 0 0 0 0 0 0 0    Fall Risk    08/22/2024    8:08 AM 06/18/2024    7:25 AM 08/22/2023    7:58 AM 03/07/2023    7:32 AM 02/03/2023    2:50 PM  Fall Risk   Falls in the past year? 0 0 0 0 0  Number falls in past yr: 0 0 0 0 0  Injury with Fall?  0 0 0 0 0  Risk for fall due to : No Fall Risks No Fall Risks No Fall Risks No Fall Risks No Fall Risks  Follow up Falls evaluation completed Falls evaluation completed Falls evaluation completed Falls evaluation completed Falls evaluation completed    MEDICARE RISK AT HOME: Medicare Risk at Home Any stairs in or around the home?: No Home free of loose throw rugs in walkways, pet beds, electrical cords, etc?: No Adequate lighting in your home to reduce risk of falls?: Yes Life alert?: No Use of a cane, walker or w/c?: No Grab bars in the bathroom?: No Shower chair or bench in shower?: No Elevated toilet seat or a handicapped toilet?: No  TIMED UP AND GO:  Was the test performed?  No    Cognitive Function:        08/22/2024    8:10 AM 08/22/2023    8:08 AM 08/16/2022    8:14 AM 08/14/2021    3:02 PM 07/27/2018    8:40 AM  6CIT Screen  What Year? 0 points 0 points 0 points 0 points 0 points  What month? 0 points 0 points 0 points 0 points 0 points  What time? 0 points 0 points 0 points 0 points 0 points  Count back from 20 0 points 0 points 0 points 0 points 0 points  Months in reverse 0 points 0 points 0 points 0 points 0 points  Repeat phrase 0 points 0 points 0 points 0 points 6 points  Total Score 0 points 0 points 0 points 0 points 6 points    Immunizations Immunization History  Administered Date(s) Administered   Moderna Sars-Covid-2  Vaccination 01/28/2020, 02/24/2020, 03/10/2020   Td 02/14/2009   Tdap 12/02/2019   Zoster Recombinant(Shingrix ) 05/24/2018, 07/27/2018   Zoster, Live 01/10/2015    TDAP status: Up to date  Flu Vaccine status: Declined, Education has been provided regarding the importance of this vaccine but patient still declined. Advised may receive this vaccine at local pharmacy or Health Dept. Aware to provide a copy of the vaccination record if obtained from local pharmacy or Health Dept. Verbalized acceptance and understanding.  Pneumococcal  vaccine status: Declined,  Education has been provided regarding the importance of this vaccine but patient still declined. Advised may receive this vaccine at local pharmacy or Health Dept. Aware to provide a copy of the vaccination record if obtained from local pharmacy or Health Dept. Verbalized acceptance and understanding.   Covid-19 vaccine status: Declined, Education has been provided regarding the importance of this vaccine but patient still declined. Advised may receive this vaccine at local pharmacy or Health Dept.or vaccine clinic. Aware to provide a copy of the vaccination record if obtained from local pharmacy or Health Dept. Verbalized acceptance and understanding.  Qualifies for Shingles Vaccine? Yes   Zostavax completed Yes   Shingrix  Completed?: Yes  Screening Tests Health Maintenance  Topic Date Due   OPHTHALMOLOGY EXAM  09/15/2023   Influenza Vaccine  06/29/2024   COVID-19 Vaccine (4 - 2025-26 season) 07/30/2024   Diabetic kidney evaluation - Urine ACR  08/16/2024   Pneumococcal Vaccine: 50+ Years (1 of 2 - PCV) 12/18/2024 (Originally 07/16/1970)   HEMOGLOBIN A1C  12/19/2024   Diabetic kidney evaluation - eGFR measurement  01/29/2025   FOOT EXAM  06/18/2025   Medicare Annual Wellness (AWV)  08/22/2025   Mammogram  10/18/2025   DEXA SCAN  10/18/2025   Colonoscopy  01/07/2028   DTaP/Tdap/Td (3 - Td or Tdap) 12/01/2029   Hepatitis C Screening  Completed   Zoster Vaccines- Shingrix   Completed   HPV VACCINES  Aged Out   Meningococcal B Vaccine  Aged Out    Health Maintenance  Health Maintenance Due  Topic Date Due   OPHTHALMOLOGY EXAM  09/15/2023   Influenza Vaccine  06/29/2024   COVID-19 Vaccine (4 - 2025-26 season) 07/30/2024   Diabetic kidney evaluation - Urine ACR  08/16/2024    Colorectal cancer screening: Type of screening: Colonoscopy. Completed 01/06/2023. Repeat every 5 years  Mammogram status: Completed 10/19/23. Repeat every year  Bone Density status:  Completed 10/19/23. Results reflect: Bone density results: OSTEOPOROSIS. Repeat every 2 years.  Lung Cancer Screening: (Low Dose CT Chest recommended if Age 6-80 years, 20 pack-year currently smoking OR have quit w/in 15years.) does not qualify.   Lung Cancer Screening Referral: n/a  Additional Screening:  Hepatitis C Screening: does qualify; Completed 07/22/2015  Vision Screening: Recommended annual ophthalmology exams for early detection of glaucoma and other disorders of the eye. Is the patient up to date with their annual eye exam?  Yes  Who is the provider or what is the name of the office in which the patient attends annual eye exams? Dr Geroge If pt is not established with a provider, would they like to be referred to a provider to establish care? N/a.   Dental Screening: Recommended annual dental exams for proper oral hygiene  Diabetic Foot Exam: Diabetic Foot Exam: Completed 06/18/2024  Community Resource Referral / Chronic Care Management: CRR required this visit?  No   CCM required this visit?  No     Plan:     I have  personally reviewed and noted the following in the patient's chart:   Medical and social history Use of alcohol, tobacco or illicit drugs  Current medications and supplements including opioid prescriptions. Patient is not currently taking opioid prescriptions. Functional ability and status Nutritional status Physical activity Advanced directives List of other physicians Hospitalizations, surgeries, and ER visits in previous 12 months. None Vitals Screenings to include cognitive, depression, and falls Referrals and appointments  In addition, I have reviewed and discussed with patient certain preventive protocols, quality metrics, and best practice recommendations. A written personalized care plan for preventive services as well as general preventive health recommendations were provided to patient.     Emma Craig, CMA   08/22/2024   After  Visit Summary: (In Person-Declined) Patient declined AVS at this time.  Nurse Notes:   Emma Craig is a 73 y.o. female patient of Metheney, Dorothyann BIRCH, MD who had a Medicare Annual Wellness Visit today via telephone. Emma Craig is Retired and lives with their spouse. she has 2 children. She reports that she is socially active and does interact with friends/family regularly. She is moderately physically active and enjoys her life.

## 2024-08-22 NOTE — Patient Instructions (Signed)
  Emma Craig , Thank you for taking time to come for your Medicare Wellness Visit. I appreciate your ongoing commitment to your health goals. Please review the following plan we discussed and let me know if I can assist you in the future.   These are the goals we discussed:  Goals       Patient Stated (pt-stated)      08/14/2021 AWV Goal: Exercise for General Health  Patient will verbalize understanding of the benefits of increased physical activity: Exercising regularly is important. It will improve your overall fitness, flexibility, and endurance. Regular exercise also will improve your overall health. It can help you control your weight, reduce stress, and improve your bone density. Over the next year, patient will increase physical activity as tolerated with a goal of at least 150 minutes of moderate physical activity per week.  You can tell that you are exercising at a moderate intensity if your heart starts beating faster and you start breathing faster but can still hold a conversation. Moderate-intensity exercise ideas include: Walking 1 mile (1.6 km) in about 15 minutes Biking Hiking Golfing Dancing Water aerobics Patient will verbalize understanding of everyday activities that increase physical activity by providing examples like the following: Yard work, such as: Insurance underwriter Gardening Washing windows or floors Patient will be able to explain general safety guidelines for exercising:  Before you start a new exercise program, talk with your health care provider. Do not exercise so much that you hurt yourself, feel dizzy, or get very short of breath. Wear comfortable clothes and wear shoes with good support. Drink plenty of water while you exercise to prevent dehydration or heat stroke. Work out until your breathing and your heartbeat get faster.       Patient Stated (pt-stated)      She  states she would like to loose 50 lbs.      Patient Stated (pt-stated)      Patient stated that she would like to work on her weight loss.      Patient Stated      Patient states she would like to continue to enjoy every moment.         This is a list of the screening recommended for you and due dates:  Health Maintenance  Topic Date Due   Eye exam for diabetics  09/15/2023   Flu Shot  06/29/2024   COVID-19 Vaccine (4 - 2025-26 season) 07/30/2024   Yearly kidney health urinalysis for diabetes  08/16/2024   Pneumococcal Vaccine for age over 70 (1 of 2 - PCV) 12/18/2024*   Hemoglobin A1C  12/19/2024   Yearly kidney function blood test for diabetes  01/29/2025   Complete foot exam   06/18/2025   Medicare Annual Wellness Visit  08/22/2025   Breast Cancer Screening  10/18/2025   DEXA scan (bone density measurement)  10/18/2025   Colon Cancer Screening  01/07/2028   DTaP/Tdap/Td vaccine (3 - Td or Tdap) 12/01/2029   Hepatitis C Screening  Completed   Zoster (Shingles) Vaccine  Completed   HPV Vaccine  Aged Out   Meningitis B Vaccine  Aged Out  *Topic was postponed. The date shown is not the original due date.

## 2024-09-24 DIAGNOSIS — E119 Type 2 diabetes mellitus without complications: Secondary | ICD-10-CM | POA: Diagnosis not present

## 2024-09-24 DIAGNOSIS — D3131 Benign neoplasm of right choroid: Secondary | ICD-10-CM | POA: Diagnosis not present

## 2024-09-24 DIAGNOSIS — Z961 Presence of intraocular lens: Secondary | ICD-10-CM | POA: Diagnosis not present

## 2024-10-22 ENCOUNTER — Encounter: Payer: Self-pay | Admitting: Family Medicine

## 2024-10-22 ENCOUNTER — Ambulatory Visit (INDEPENDENT_AMBULATORY_CARE_PROVIDER_SITE_OTHER): Admitting: Family Medicine

## 2024-10-22 VITALS — BP 128/70 | HR 70 | Ht 61.0 in | Wt 194.0 lb

## 2024-10-22 DIAGNOSIS — E038 Other specified hypothyroidism: Secondary | ICD-10-CM | POA: Diagnosis not present

## 2024-10-22 DIAGNOSIS — I10 Essential (primary) hypertension: Secondary | ICD-10-CM | POA: Diagnosis not present

## 2024-10-22 DIAGNOSIS — H8111 Benign paroxysmal vertigo, right ear: Secondary | ICD-10-CM | POA: Diagnosis not present

## 2024-10-22 DIAGNOSIS — E119 Type 2 diabetes mellitus without complications: Secondary | ICD-10-CM

## 2024-10-22 DIAGNOSIS — E1122 Type 2 diabetes mellitus with diabetic chronic kidney disease: Secondary | ICD-10-CM

## 2024-10-22 DIAGNOSIS — N1831 Chronic kidney disease, stage 3a: Secondary | ICD-10-CM | POA: Diagnosis not present

## 2024-10-22 LAB — POCT GLYCOSYLATED HEMOGLOBIN (HGB A1C): Hemoglobin A1C: 6.2 % — AB (ref 4.0–5.6)

## 2024-10-22 MED ORDER — OLMESARTAN MEDOXOMIL-HCTZ 40-12.5 MG PO TABS: 1.0000 | ORAL_TABLET | Freq: Every day | ORAL | 3 refills | Status: AC

## 2024-10-22 MED ORDER — LEVOTHYROXINE SODIUM 75 MCG PO TABS: 75.0000 ug | ORAL_TABLET | Freq: Every day | ORAL | 3 refills | Status: AC

## 2024-10-22 MED ORDER — ROSUVASTATIN CALCIUM 20 MG PO TABS: 20.0000 mg | ORAL_TABLET | Freq: Every day | ORAL | 3 refills | Status: AC

## 2024-10-22 NOTE — Progress Notes (Signed)
 Established Patient Office Visit  Patient ID: Emma Craig, female    DOB: February 11, 1951  Age: 73 y.o. MRN: [id number] PCP: Alvan Dorothyann BIRCH, MD  Chief Complaint  Patient presents with   Diabetes   Hypothyroidism   Hypertension    Subjective:     HPI  Discussed the use of AI scribe software for clinical note transcription with the patient, who gave verbal consent to proceed.  History of Present Illness Emma Craig is a 73 year old female who presents with dizziness and vertigo symptoms.  Vertigo and dizziness - Dizziness occurs when laying down or sitting up, described as a sensation similar to being on a roller coaster. - Episodes began in September and occur approximately three times per week. - Symptoms typically resolve within a few minutes after sitting up and waiting. - Dizziness is more pronounced when turning to the right side while in bed. - No associated nausea.  Psychological stress - Significant emotional stress related to family issues, particularly concerning her alcoholic brother in the [place]. - Recent prolonged phone conversation with her sister regarding the situation, contributing to stress.  Cutaneous symptoms - Dry skin attributed to aging and weather changes.  Metabolic monitoring - Awaiting results for A1c levels.   Stress of younger brother who will likely need to be place in nursing home.       ROS    Objective:     BP 128/70   Pulse 70   Ht 5' 1 (1.549 m)   Wt 194 lb 0.6 oz (88 kg)   LMP  (LMP Unknown)   SpO2 99%   BMI 36.66 kg/m     Physical Exam Vitals and nursing note reviewed.  Constitutional:      Appearance: Normal appearance.  HENT:     Head: Normocephalic and atraumatic.     Right Ear: Ear canal and external ear normal.     Left Ear: Tympanic membrane, ear canal and external ear normal.     Ears:     Comments: Partial blockage of the Right TM    Mouth/Throat:     Mouth: Mucous membranes are moist.      Pharynx: No posterior oropharyngeal erythema.  Eyes:     Conjunctiva/sclera: Conjunctivae normal.  Cardiovascular:     Rate and Rhythm: Normal rate and regular rhythm.  Pulmonary:     Effort: Pulmonary effort is normal.     Breath sounds: Normal breath sounds.  Skin:    General: Skin is warm and dry.  Neurological:     Mental Status: She is alert.  Psychiatric:        Mood and Affect: Mood normal.      Results for orders placed or performed in visit on 10/22/24  POCT HgB A1C  Result Value Ref Range   Hemoglobin A1C 6.2 (A) 4.0 - 5.6 %   HbA1c POC (<> result, manual entry)     HbA1c, POC (prediabetic range)     HbA1c, POC (controlled diabetic range)        The 10-year ASCVD risk score (Arnett DK, et al., 2019) is: 25.1%    Assessment & Plan:   Problem List Items Addressed This Visit       Cardiovascular and Mediastinum   Essential hypertension   Relevant Medications   olmesartan -hydrochlorothiazide  (BENICAR  HCT) 40-12.5 MG tablet   rosuvastatin  (CRESTOR ) 20 MG tablet   Other Relevant Orders   POCT HgB A1C (Completed)   TSH   Urine Microalbumin w/creat.  ratio   CMP14+EGFR     Endocrine   Hypothyroidism   Relevant Medications   levothyroxine  (SYNTHROID ) 75 MCG tablet   Other Relevant Orders   POCT HgB A1C (Completed)   TSH   Urine Microalbumin w/creat. ratio   Diabetes mellitus with stage 3a chronic kidney disease, without long-term current use of insulin (HCC) - Primary   Relevant Medications   olmesartan -hydrochlorothiazide  (BENICAR  HCT) 40-12.5 MG tablet   rosuvastatin  (CRESTOR ) 20 MG tablet   Other Visit Diagnoses       BPPV (benign paroxysmal positional vertigo), right           Assessment and Plan Assessment & Plan Type 2 diabetes mellitus, diet controlled A1c is 6.2, indicating good control but not optimal. - Continue current diet control measures.  Chronic kidney disease stage 3 Requires monitoring of kidney function. - Obtained urine  sample for protein test.  Other specified hypothyroidism Reports dry skin, possibly related to hypothyroidism. Thyroid  function needs re-evaluation. - Ordered thyroid  function tests.  Benign paroxysmal positional vertigo Intermittent dizziness suggestive of benign paroxysmal positional vertigo, likely due to displaced otoliths. - Recommended exercises to reposition otoliths. - Handout provided with home exercises.   General Health Maintenance Due for updated pneumonia vaccine next year. Discussed 20-valent pneumonia vaccine. - Offered 20-valent pneumonia vaccine next year.    Return in about 6 months (around 04/21/2025).    [name], MD [facility] at [facility]

## 2024-10-23 ENCOUNTER — Ambulatory Visit: Payer: Self-pay | Admitting: Family Medicine

## 2024-10-23 DIAGNOSIS — R748 Abnormal levels of other serum enzymes: Secondary | ICD-10-CM

## 2024-10-23 DIAGNOSIS — R7989 Other specified abnormal findings of blood chemistry: Secondary | ICD-10-CM

## 2024-10-23 LAB — CMP14+EGFR
ALT: 28 IU/L (ref 0–32)
AST: 27 IU/L (ref 0–40)
Albumin: 4.6 g/dL (ref 3.8–4.8)
Alkaline Phosphatase: 66 IU/L (ref 49–135)
BUN/Creatinine Ratio: 23 (ref 12–28)
BUN: 28 mg/dL — ABNORMAL HIGH (ref 8–27)
Bilirubin Total: <0.2 mg/dL (ref 0.0–1.2)
CO2: 23 mmol/L (ref 20–29)
Calcium: 9.7 mg/dL (ref 8.7–10.3)
Chloride: 102 mmol/L (ref 96–106)
Creatinine, Ser: 1.24 mg/dL — ABNORMAL HIGH (ref 0.57–1.00)
Globulin, Total: 2.3 g/dL (ref 1.5–4.5)
Glucose: 127 mg/dL — ABNORMAL HIGH (ref 70–99)
Potassium: 4.4 mmol/L (ref 3.5–5.2)
Sodium: 144 mmol/L (ref 134–144)
Total Protein: 6.9 g/dL (ref 6.0–8.5)
eGFR: 47 mL/min/1.73 — ABNORMAL LOW (ref >59)

## 2024-10-23 LAB — MICROALBUMIN / CREATININE URINE RATIO
Creatinine, Urine: 119.7 mg/dL
Microalb/Creat Ratio: 7 mg/g{creat} (ref 0–29)
Microalbumin, Urine: 7.8 ug/mL

## 2024-10-23 LAB — TSH: TSH: 1.69 u[IU]/mL (ref 0.450–4.500)

## 2024-10-23 NOTE — Progress Notes (Signed)
 Call patient: Evidently there was just delay on the urine sample so it is back now and there is no excess protein in the urine which is great.

## 2024-10-23 NOTE — Progress Notes (Signed)
 Call patient: Kidney function is up a little bit her baseline is usually closer to 1.1 but she did look a little dry on the blood work just make sure you are hydrating well and we will plan to recheck in 3 months instead of 6 months.  Thyroid  looks great.  Did she leave a urine sample with us  yesterday we were supposed to collect a urine microalbumin but I do not see it on here?

## 2024-11-30 ENCOUNTER — Other Ambulatory Visit: Payer: Self-pay | Admitting: Family Medicine

## 2024-11-30 DIAGNOSIS — Z1231 Encounter for screening mammogram for malignant neoplasm of breast: Secondary | ICD-10-CM

## 2024-12-13 ENCOUNTER — Ambulatory Visit

## 2024-12-13 DIAGNOSIS — Z1231 Encounter for screening mammogram for malignant neoplasm of breast: Secondary | ICD-10-CM | POA: Diagnosis not present

## 2024-12-17 ENCOUNTER — Ambulatory Visit: Payer: Self-pay | Admitting: Family Medicine

## 2024-12-17 NOTE — Progress Notes (Signed)
 Please call patient. Normal mammogram.  Repeat in 1 year.

## 2024-12-20 ENCOUNTER — Ambulatory Visit
Admission: EM | Admit: 2024-12-20 | Discharge: 2024-12-20 | Disposition: A | Discharge status: Home / Self Care | Attending: Emergency Medicine | Admitting: Emergency Medicine

## 2024-12-20 ENCOUNTER — Ambulatory Visit: Payer: Self-pay

## 2024-12-20 ENCOUNTER — Ambulatory Visit

## 2024-12-20 DIAGNOSIS — M79671 Pain in right foot: Secondary | ICD-10-CM

## 2024-12-20 NOTE — Telephone Encounter (Signed)
 FYI Only or Action Required?: FYI only for provider: Pt going to UC.  Patient was last seen in primary care on 10/22/2024 by Alvan Dorothyann BIRCH, MD.  Called Nurse Triage reporting Ankle Pain.  Symptoms began yesterday.  Interventions attempted: OTC medications: Tylenol  and Rest, hydration, or home remedies.  Symptoms are: gradually worsening.  Triage Disposition: See HCP Within 4 Hours (Or PCP Triage)  Patient/caregiver understands and will follow disposition?: Yes   Yesterday onset of 7/10 stabbing pain in right ankle while sitting on the couch. No recent strain or injury. No fever or redness. Mild swelling. No calf pain. No CP or SOB. Advised to be seen today. Pt going to UC. Advised ED for worsening symptoms.    Message from Mercer PEDLAR sent at 12/20/2024  5:35 PM EST  Reason for Triage: right foot ankle pain started yesterday 12/19/24   Reason for Disposition  [1] SEVERE pain (e.g., excruciating, unable to walk) AND [2] not improved after 2 hours of pain medicine    Pt described as severe sharp pain, 7/10  Answer Assessment - Initial Assessment Questions 1. ONSET: When did the pain start?      Yesterday  2. LOCATION: Where is the pain located?      Right ankle  3. PAIN: How bad is the pain?  (Scale 1-10; or mild, moderate, severe)     7/10 sharp pain  4. WORK OR EXERCISE: Has there been any recent work or exercise that involved this part of the body?      Denies  5. CAUSE: What do you think is causing the ankle pain?     Unsure, maybe gout  6. OTHER SYMPTOMS: Do you have any other symptoms? (e.g., calf pain, rash, fever, swelling)     Denies  Protocols used: Ankle Pain-A-AH

## 2024-12-20 NOTE — Discharge Instructions (Addendum)
 Your x-ray did not reveal any underlying fracture or cause for your pain at this time. You can try applying Voltaren  gel 4 times daily to help with pain. Otherwise take 500 to 1000 mg of Tylenol  every 6-8 hours as needed for pain. Rest, ice, and elevate periodically throughout the day. We have provided you with an Ace wrap and postop shoe to provide compression and support to the foot to help with healing. You can follow-up with [facility] if your pain continues for further evaluation. Otherwise follow-up with your primary care provider or return here as needed.

## 2024-12-20 NOTE — ED Triage Notes (Signed)
 Pt c/o RT ankle pain since yesterday around 4p. Denies injury. Says she was sitting on the couch when felt a sudden sharp pain. Tylenol  prn.

## 2024-12-20 NOTE — ED Provider Notes (Signed)
 " Emma Craig    CSN: [id number] Arrival date & time: 12/20/24  1814      History   Chief Complaint Chief Complaint  Patient presents with   Ankle Pain    RT    HPI Emma Craig is a 74 y.o. female.   Patient with past medical history of hypertension, hyperlipidemia, diabetes, obesity, and CKD presents with sharp right lateral foot pain that began around 4 PM yesterday.  Patient states that she was sitting on the couch when she suddenly felt a sharp pain in her foot.  Patient states the pain has continued since then.    Patient reports that she did take Tylenol  with some relief but the pain continues.  Patient reports that she has been able to bear weight on her foot, but this does cause worsening pain.    Patient denies any notable swelling to her foot.  Patient denies any known injuries.  Patient reports that she was doing Zumba approximately 2 weeks ago and while she was doing that she felt like both of her feet were burning and wonders if this is related.  The history is provided by the patient and medical records.  Ankle Pain   Past Medical History:  Diagnosis Date   Chronic kidney disease, stage 2, mildly decreased GFR    Diabetes mellitus    Heavy menses    Hyperlipidemia    Hypertension    Obesity     Patient Active Problem List   Diagnosis Date Noted   Microalbuminuria due to type 2 diabetes mellitus (HCC) 08/17/2023   Renal cyst 12/02/2021   Elevated liver enzymes 10/13/2020   GERD (gastroesophageal reflux disease) 06/09/2020   Gastrointestinal hemorrhage associated with duodenal ulcer 08/23/2019   History of duodenal ulcer 08/21/2019   Diabetes mellitus with stage 3a chronic kidney disease, without long-term current use of insulin (HCC) 08/11/2019   Opacity of lung on imaging study 08/10/2019   Melena 08/09/2019   Anemia 08/09/2019   Tendinopathy of left rotator cuff 08/03/2019   Duodenal ulcer 07/31/2019   Elevated ALT measurement 06/04/2019    Osteoporosis 08/09/2018   DJD of right AC (acromioclavicular) joint 04/01/2017   Rotator cuff tendonitis, right 04/01/2017   Fatty liver 03/22/2016   CKD (chronic kidney disease) stage 3, GFR 30-59 ml/min (HCC) 03/16/2016   Hypothyroidism 11/01/2008   Severe obesity (BMI 35.0-35.9 with comorbidity) (HCC) 08/27/2008   Disturbance in sleep behavior 01/17/2008   Hyperlipidemia associated with type 2 diabetes mellitus (HCC) 09/14/2006   Essential hypertension 09/14/2006    Past Surgical History:  Procedure Laterality Date   CATARACT EXTRACTION  1-09   right    CATARACT EXTRACTION  09/2011   left    CHOLECYSTECTOMY     laproscopic   rt elbow screws placed  10-82    OB History   No obstetric history on file.      Home Medications    Prior to Admission medications  Medication Sig Start Date End Date Taking? Authorizing Provider  Calcium -Magnesium-Vitamin D (CALCIUM  1200+D3 PO) Take 1 tablet by mouth daily.    [provider]  levothyroxine  (SYNTHROID ) 75 MCG tablet Take 1 tablet (75 mcg total) by mouth daily before breakfast. Need labs 10/22/24   Alvan Dorothyann BIRCH, MD  metoprolol  (TOPROL -XL) 200 MG 24 hr tablet TAKE 1 TABLET DAILY 03/23/24   Alvan Dorothyann BIRCH, MD  olmesartan -hydrochlorothiazide  (BENICAR  HCT) 40-12.5 MG tablet Take 1 tablet by mouth daily. 10/22/24   Alvan Dorothyann BIRCH,  MD  rosuvastatin  (CRESTOR ) 20 MG tablet Take 1 tablet (20 mg total) by mouth daily. 10/22/24   Alvan Dorothyann BIRCH, MD    Family History Family History  Problem Relation Age of Onset   Cancer Father 29       colon and prostate CA   Cancer Mother 102       carcinoid tumor/with mets to brain   Hypertension Sister    Hyperlipidemia Sister    Hypertension Brother    Hypertension Brother    Hypertension Sister    Hyperlipidemia Sister    Hypertension Sister    Hyperlipidemia Sister    Hypertension Sister    Hyperlipidemia Sister    Lymphoma Sister        non-hodgkins      Social History Social History[1]   Allergies   Patient has no known allergies.   Review of Systems Review of Systems  Per HPI  Physical Exam Triage Vital Signs ED Triage Vitals  Encounter Vitals Group     BP 12/20/24 1904 117/74     Girls Systolic BP Percentile --      Girls Diastolic BP Percentile --      Boys Systolic BP Percentile --      Boys Diastolic BP Percentile --      Pulse Rate 12/20/24 1904 76     Resp 12/20/24 1904 17     Temp 12/20/24 1904 98 F (36.7 C)     Temp src --      SpO2 12/20/24 1904 97 %     Weight --      Height --      Head Circumference --      Peak Flow --      Pain Score 12/20/24 1903 6     Pain Loc --      Pain Education --      Exclude from Growth Chart --    No data found.  Updated Vital Signs BP 117/74 (BP Location: Right Arm)   Pulse 76   Temp 98 F (36.7 C)   Resp 17   LMP  (LMP Unknown)   SpO2 97%   Visual Acuity Right Eye Distance:   Left Eye Distance:   Bilateral Distance:    Right Eye Near:   Left Eye Near:    Bilateral Near:     Physical Exam Vitals and nursing note reviewed.  Constitutional:      General: She is awake. She is not in acute distress.    Appearance: Normal appearance. She is well-developed and well-groomed. She is not ill-appearing.  Musculoskeletal:     Right ankle: Normal. No swelling. No tenderness. Normal range of motion.     Right foot: Normal range of motion and normal capillary refill. Tenderness present. No swelling or deformity. Normal pulse.       Feet:     Comments: Mild tenderness noted to lateral aspect of right foot just below the ankle  Skin:    General: Skin is warm and dry.  Neurological:     General: No focal deficit present.     Mental Status: She is alert and oriented to person, place, and time. Mental status is at baseline.  Psychiatric:        Behavior: Behavior is cooperative.      UC Treatments / Results  Labs (all labs ordered are listed, but only  abnormal results are displayed) Labs Reviewed - No data to display  EKG   Radiology  DG Foot Complete Right Result Date: 12/20/2024 EXAM: 3 OR MORE VIEW(S) XRAY OF THE RIGHT FOOT 12/20/2024 07:34:00 PM COMPARISON: None available. CLINICAL HISTORY: Right lateral foot pain, no known injury. FINDINGS: BONES AND JOINTS: No acute fracture. No malalignment. Small plantar and posterior calcaneal spurs. Mild changes of the 1st MTP joint. SOFT TISSUES: Unremarkable. IMPRESSION: 1. No acute findings. 2. Mild degenerative changes of the 1st MTP joint. Electronically signed by: Greig Pique MD 12/20/2024 07:46 PM EST RP Workstation: [id number]    Procedures Procedures (including critical Craig time)  Medications Ordered in UC Medications - No data to display  Initial Impression / Assessment and Plan / UC Course  I have reviewed the triage vital signs and the nursing notes.  Pertinent labs & imaging results that were available during my Craig of the patient were reviewed by me and considered in my medical decision making (see chart for details).     Patient is overall well-appearing.  Vitals are stable.  X-ray ordered.  I independently interpreted these images and there is no acute process present at this time.  Radiology report confirms this.  I was going to prescribe Voltaren  gel, but husband reported that he had some of this at home that they would try.  Recommended Tylenol  as needed for additional pain relief. Discussed RICE therapy.  Provided patient with Ace wrap and postop shoe in clinic today to help provide compression and support to assist with healing.  Given orthopedic follow-up if pain continues.  Discussed follow-up and return precautions. Final Clinical Impressions(s) / UC Diagnoses   Final diagnoses:  Right foot pain     Discharge Instructions      Your x-ray did not reveal any underlying fracture or cause for your pain at this time. You can try applying Voltaren  gel 4 times  daily to help with pain. Otherwise take 500 to 1000 mg of Tylenol  every 6-8 hours as needed for pain. Rest, ice, and elevate periodically throughout the day. We have provided you with an Ace wrap and postop shoe to provide compression and support to the foot to help with healing. You can follow-up with [facility] if your pain continues for further evaluation. Otherwise follow-up with your primary Craig provider or return here as needed.     ED Prescriptions   None    I have reviewed the PDMP during this encounter.    [1]  Social History Tobacco Use   Smoking status: Never   Smokeless tobacco: Never  Vaping Use   Vaping status: Never Used  Substance Use Topics   Alcohol use: Yes    Comment: rare     Johnie Rumaldo LABOR, NP 12/20/24 1953  "

## 2024-12-21 NOTE — Telephone Encounter (Signed)
 Closing note. Pt going to UC

## 2025-04-29 ENCOUNTER — Ambulatory Visit: Admitting: Family Medicine

## 2025-08-27 ENCOUNTER — Ambulatory Visit
